# Patient Record
Sex: Female | Born: 1935 | Race: Black or African American | Hispanic: No | Smoking: Never smoker
Health system: Southern US, Community
[De-identification: ages and names within clinical notes are randomized; demographics above are authoritative.]

## PROBLEM LIST (undated history)

## (undated) DIAGNOSIS — Z950 Presence of cardiac pacemaker: Secondary | ICD-10-CM

## (undated) DIAGNOSIS — I480 Paroxysmal atrial fibrillation: Secondary | ICD-10-CM

## (undated) DIAGNOSIS — F0390 Unspecified dementia without behavioral disturbance: Secondary | ICD-10-CM

## (undated) DIAGNOSIS — R609 Edema, unspecified: Secondary | ICD-10-CM

## (undated) DIAGNOSIS — M1990 Unspecified osteoarthritis, unspecified site: Secondary | ICD-10-CM

## (undated) DIAGNOSIS — I5032 Chronic diastolic (congestive) heart failure: Secondary | ICD-10-CM

## (undated) DIAGNOSIS — I10 Essential (primary) hypertension: Secondary | ICD-10-CM

## (undated) DIAGNOSIS — K219 Gastro-esophageal reflux disease without esophagitis: Secondary | ICD-10-CM

## (undated) DIAGNOSIS — E7800 Pure hypercholesterolemia, unspecified: Secondary | ICD-10-CM

## (undated) HISTORY — PX: TONSILLECTOMY: SUR1361

## (undated) HISTORY — DX: Pure hypercholesterolemia, unspecified: E78.00

## (undated) HISTORY — PX: ABDOMINAL HYSTERECTOMY: SHX81

## (undated) HISTORY — PX: OTHER SURGICAL HISTORY: SHX169

## (undated) HISTORY — DX: Presence of cardiac pacemaker: Z95.0

## (undated) HISTORY — PX: CHOLECYSTECTOMY: SHX55

## (undated) HISTORY — DX: Edema, unspecified: R60.9

## (undated) HISTORY — DX: Paroxysmal atrial fibrillation: I48.0

## (undated) HISTORY — DX: Essential (primary) hypertension: I10

## (undated) HISTORY — PX: INSERT / REPLACE / REMOVE PACEMAKER: SUR710

---

## 2002-03-08 ENCOUNTER — Emergency Department (HOSPITAL_COMMUNITY): Admission: EM | Admit: 2002-03-08 | Discharge: 2002-03-08 | Payer: Self-pay | Admitting: *Deleted

## 2002-03-19 ENCOUNTER — Emergency Department (HOSPITAL_COMMUNITY): Admission: EM | Admit: 2002-03-19 | Discharge: 2002-03-19 | Payer: Self-pay | Admitting: Emergency Medicine

## 2002-03-19 ENCOUNTER — Encounter: Payer: Self-pay | Admitting: Emergency Medicine

## 2002-12-20 ENCOUNTER — Emergency Department (HOSPITAL_COMMUNITY): Admission: EM | Admit: 2002-12-20 | Discharge: 2002-12-20 | Payer: Self-pay | Admitting: Emergency Medicine

## 2004-04-01 DIAGNOSIS — Z950 Presence of cardiac pacemaker: Secondary | ICD-10-CM

## 2004-04-01 HISTORY — DX: Presence of cardiac pacemaker: Z95.0

## 2004-07-16 ENCOUNTER — Emergency Department (HOSPITAL_COMMUNITY): Admission: EM | Admit: 2004-07-16 | Discharge: 2004-07-16 | Payer: Self-pay | Admitting: Family Medicine

## 2005-05-28 ENCOUNTER — Emergency Department (HOSPITAL_COMMUNITY): Admission: EM | Admit: 2005-05-28 | Discharge: 2005-05-28 | Payer: Self-pay | Admitting: Internal Medicine

## 2006-06-25 ENCOUNTER — Encounter: Admission: RE | Admit: 2006-06-25 | Discharge: 2006-06-25 | Payer: Self-pay | Admitting: Emergency Medicine

## 2008-08-08 ENCOUNTER — Emergency Department (HOSPITAL_COMMUNITY): Admission: EM | Admit: 2008-08-08 | Discharge: 2008-08-08 | Payer: Self-pay | Admitting: *Deleted

## 2008-12-09 ENCOUNTER — Observation Stay (HOSPITAL_COMMUNITY): Admission: EM | Admit: 2008-12-09 | Discharge: 2008-12-10 | Payer: Self-pay | Admitting: Emergency Medicine

## 2008-12-14 ENCOUNTER — Telehealth (INDEPENDENT_AMBULATORY_CARE_PROVIDER_SITE_OTHER): Payer: Self-pay | Admitting: *Deleted

## 2008-12-15 ENCOUNTER — Ambulatory Visit: Payer: Self-pay

## 2008-12-21 ENCOUNTER — Telehealth (INDEPENDENT_AMBULATORY_CARE_PROVIDER_SITE_OTHER): Payer: Self-pay | Admitting: *Deleted

## 2008-12-21 DIAGNOSIS — R0789 Other chest pain: Secondary | ICD-10-CM | POA: Insufficient documentation

## 2009-01-17 DIAGNOSIS — I10 Essential (primary) hypertension: Secondary | ICD-10-CM | POA: Insufficient documentation

## 2009-01-17 DIAGNOSIS — M1990 Unspecified osteoarthritis, unspecified site: Secondary | ICD-10-CM | POA: Insufficient documentation

## 2009-01-19 ENCOUNTER — Ambulatory Visit: Payer: Self-pay

## 2009-01-19 ENCOUNTER — Ambulatory Visit (HOSPITAL_COMMUNITY): Admission: RE | Admit: 2009-01-19 | Discharge: 2009-01-19 | Payer: Self-pay | Admitting: Cardiology

## 2009-01-19 ENCOUNTER — Ambulatory Visit: Payer: Self-pay | Admitting: Cardiology

## 2009-01-19 ENCOUNTER — Ambulatory Visit: Payer: Self-pay | Admitting: Internal Medicine

## 2009-01-19 ENCOUNTER — Encounter: Payer: Self-pay | Admitting: Internal Medicine

## 2009-01-19 DIAGNOSIS — E785 Hyperlipidemia, unspecified: Secondary | ICD-10-CM | POA: Insufficient documentation

## 2009-01-26 ENCOUNTER — Ambulatory Visit (HOSPITAL_COMMUNITY): Admission: RE | Admit: 2009-01-26 | Discharge: 2009-01-26 | Payer: Self-pay | Admitting: Internal Medicine

## 2009-01-26 ENCOUNTER — Encounter: Payer: Self-pay | Admitting: Internal Medicine

## 2009-01-27 ENCOUNTER — Ambulatory Visit: Payer: Self-pay | Admitting: Cardiovascular Disease

## 2009-02-03 ENCOUNTER — Encounter (INDEPENDENT_AMBULATORY_CARE_PROVIDER_SITE_OTHER): Payer: Self-pay | Admitting: *Deleted

## 2009-03-08 ENCOUNTER — Ambulatory Visit: Payer: Self-pay | Admitting: Internal Medicine

## 2009-03-08 DIAGNOSIS — M949 Disorder of cartilage, unspecified: Secondary | ICD-10-CM

## 2009-03-08 DIAGNOSIS — M25519 Pain in unspecified shoulder: Secondary | ICD-10-CM | POA: Insufficient documentation

## 2009-03-08 DIAGNOSIS — R259 Unspecified abnormal involuntary movements: Secondary | ICD-10-CM | POA: Insufficient documentation

## 2009-03-08 DIAGNOSIS — K219 Gastro-esophageal reflux disease without esophagitis: Secondary | ICD-10-CM | POA: Insufficient documentation

## 2009-03-08 DIAGNOSIS — M899 Disorder of bone, unspecified: Secondary | ICD-10-CM | POA: Insufficient documentation

## 2009-03-09 ENCOUNTER — Telehealth: Payer: Self-pay | Admitting: Internal Medicine

## 2009-03-09 LAB — CONVERTED CEMR LAB
ALT: 13 units/L (ref 0–35)
AST: 16 units/L (ref 0–37)
Albumin: 3.6 g/dL (ref 3.5–5.2)
Alkaline Phosphatase: 82 units/L (ref 39–117)
Bilirubin, Direct: 0 mg/dL (ref 0.0–0.3)
LDL Cholesterol: 118 mg/dL — ABNORMAL HIGH (ref 0–99)
Total Protein: 8.6 g/dL — ABNORMAL HIGH (ref 6.0–8.3)

## 2009-03-14 ENCOUNTER — Encounter: Payer: Self-pay | Admitting: Internal Medicine

## 2009-03-27 ENCOUNTER — Telehealth: Payer: Self-pay | Admitting: Internal Medicine

## 2009-03-28 ENCOUNTER — Encounter (INDEPENDENT_AMBULATORY_CARE_PROVIDER_SITE_OTHER): Payer: Self-pay | Admitting: *Deleted

## 2009-04-07 ENCOUNTER — Telehealth: Payer: Self-pay | Admitting: Internal Medicine

## 2009-06-29 ENCOUNTER — Emergency Department (HOSPITAL_COMMUNITY): Admission: EM | Admit: 2009-06-29 | Discharge: 2009-06-29 | Payer: Self-pay | Admitting: Emergency Medicine

## 2009-06-29 ENCOUNTER — Ambulatory Visit: Payer: Self-pay | Admitting: Internal Medicine

## 2009-07-25 ENCOUNTER — Ambulatory Visit: Payer: Self-pay | Admitting: Internal Medicine

## 2009-07-25 DIAGNOSIS — M25569 Pain in unspecified knee: Secondary | ICD-10-CM | POA: Insufficient documentation

## 2009-08-03 ENCOUNTER — Emergency Department (HOSPITAL_COMMUNITY): Admission: EM | Admit: 2009-08-03 | Discharge: 2009-08-03 | Payer: Self-pay | Admitting: Emergency Medicine

## 2009-08-07 ENCOUNTER — Encounter: Payer: Self-pay | Admitting: Internal Medicine

## 2009-08-09 ENCOUNTER — Encounter: Payer: Self-pay | Admitting: Internal Medicine

## 2009-08-11 ENCOUNTER — Telehealth (INDEPENDENT_AMBULATORY_CARE_PROVIDER_SITE_OTHER): Payer: Self-pay | Admitting: *Deleted

## 2009-09-13 ENCOUNTER — Ambulatory Visit: Payer: Self-pay | Admitting: Internal Medicine

## 2009-09-13 DIAGNOSIS — F329 Major depressive disorder, single episode, unspecified: Secondary | ICD-10-CM | POA: Insufficient documentation

## 2009-09-13 DIAGNOSIS — R609 Edema, unspecified: Secondary | ICD-10-CM | POA: Insufficient documentation

## 2009-09-13 DIAGNOSIS — H918X9 Other specified hearing loss, unspecified ear: Secondary | ICD-10-CM | POA: Insufficient documentation

## 2009-09-13 DIAGNOSIS — R5381 Other malaise: Secondary | ICD-10-CM | POA: Insufficient documentation

## 2009-09-13 DIAGNOSIS — R5383 Other fatigue: Secondary | ICD-10-CM

## 2009-09-13 HISTORY — DX: Edema, unspecified: R60.9

## 2009-09-21 ENCOUNTER — Encounter (INDEPENDENT_AMBULATORY_CARE_PROVIDER_SITE_OTHER): Payer: Self-pay | Admitting: *Deleted

## 2009-09-28 ENCOUNTER — Encounter: Payer: Self-pay | Admitting: Internal Medicine

## 2010-01-23 ENCOUNTER — Ambulatory Visit: Payer: Self-pay | Admitting: Internal Medicine

## 2010-01-23 DIAGNOSIS — M79609 Pain in unspecified limb: Secondary | ICD-10-CM | POA: Insufficient documentation

## 2010-02-07 ENCOUNTER — Encounter (INDEPENDENT_AMBULATORY_CARE_PROVIDER_SITE_OTHER): Payer: Self-pay | Admitting: *Deleted

## 2010-02-20 ENCOUNTER — Encounter: Payer: Self-pay | Admitting: Internal Medicine

## 2010-03-20 ENCOUNTER — Ambulatory Visit: Payer: Self-pay | Admitting: Internal Medicine

## 2010-05-01 NOTE — Letter (Signed)
Summary: Power Wheelchair/[employer]  [employer] Wheel[employer]   Imported By: Sherian Rein 08/10/2009 11:40:35  _____________________________________________________________________  External Attachment:    Type:   Image     Comment:   External Document

## 2010-05-01 NOTE — Consult Note (Signed)
Summary: Office Visit / [facility]  Office Visit / [facility]   Imported By: Lennie Odor 04/03/2009 14:01:03  _____________________________________________________________________  External Attachment:    Type:   Image     Comment:   External Document

## 2010-05-01 NOTE — Letter (Signed)
Summary: DME/[facility]  DME/[facility]   Imported By: Lester [place] 08/15/2009 09:17:00  _____________________________________________________________________  External Attachment:    Type:   Image     Comment:   External Document

## 2010-05-01 NOTE — Progress Notes (Signed)
Summary: Mobility Eval forms--Pt needs appt  Phone Note Other Incoming   Summary of Call: Our office received paperwork from [facility] for Face-to-Face Mobility Eval. Per MD patient needs office visit. Called patient to notify, spoke with patient daughter and she made appt for 04-12-09. Initial call taken by: Lucious Groves,  April 07, 2009 9:27 AM

## 2010-05-01 NOTE — Letter (Signed)
Summary: [facility] Consult Scheduled Letter  [facility]  [address] [place], [place] [address]   Phone: [phone]  Fax: [phone]      09/21/2009 MRN: [id number]  Tara Hughes [address] [place], [place]  [address]    Dear Ms. Picklesimer,      We have scheduled an appointment for you. At the recommendation of Dr.John, we have scheduled you a consult with Dr. Clovis Cao [facility]) on June 30,2011 at 9:30AM. Their phone number is [phone].If this appointment day and time is not convenient for you, please feel free to call the office of the doctor you are being referred to at the number listed above and reschedule the appointment.  [facility] [address] [place], [place] [phone] phone   Thank you,  Patient Care Coordinator [facility]

## 2010-05-01 NOTE — Assessment & Plan Note (Signed)
Summary: 6 MOS F/U #/CD   Vital Signs:  Patient profile:   77 year old female Height:      59 inches Weight:      172 pounds BMI:     34.87 O2 Sat:      98 % on Room air Temp:     98 degrees F oral Pulse rate:   81 / minute BP sitting:   142 / 90  (left arm) Cuff size:   regular  Vitals Entered ByZella Ball Ewing (September 13, 2009 9:35 AM)  O2 Flow:  Room air  CC: 6 month followup/RE   Primary Care Provider:  None  CC:  6 month followup/RE.  History of Present Illness: fell with left wrist fx april 2011 - to get cast off soon; but now has the power wheelchair; today walkign with the cane;  here to c/o increased bilat edema to legs for about 1-2 months, seems assoc with increased knee sweling and pain, gradually worse'; Pt denies CP except for indigestion when she does not chew well, sob, doe, wheezing, orthopnea, pnd, worsening LE edema, palps, dizziness or syncope .  Pt denies new neuro symptoms such as headache, facial or extremity weakness  Trying to follow lower chol diet, good med complaicne and tolerance. without myalgias or joint pains except knees.  Here for wellness Diet: Heart Healthy or DM if diabetic Physical Activities: Sedentary Depression/mood screen: mild, but declines tx  Hearing: getting worse in the past year  Visual Acuity: wears glasses, harder to see distance ADL's:needs help with bathing, dressing, grooming, toileting due to broken left wrist but daughter lives with her to help for now  Fall Risk: High risk of fall - has power wheelchair at home Home Safety: Good Cognitive Impairment:  Gen appearance, affect, speech, memory, attention & motor skills grossly intact End-of-Life Planning: Advance directive - Full code/I agree   Preventive Screening-Counseling & Management      Drug Use:  no.    Problems Prior to Update: 1)  Peripheral Edema  (ICD-782.3) 2)  Fatigue  (ICD-780.79) 3)  Other Specified Forms of Hearing Loss  (ICD-389.8) 4)  Depression   (ICD-311) 5)  Knee Pain, Bilateral  (ICD-719.46) 6)  Knee Pain, Bilateral  (ICD-719.46) 7)  Hepatotoxicity, Drug-induced, Risk of  (ICD-V58.69) 8)  Shoulder Pain, Bilateral  (ICD-719.41) 9)  Hyperlipidemia  (ICD-272.4) 10)  Osteopenia  (ICD-733.90) 11)  Tremor  (ICD-781.0) 12)  Gerd  (ICD-530.81) 13)  Hyperlipidemia-mixed  (ICD-272.4) 14)  Osteoarthritis  (ICD-715.90) 15)  Morbid Obesity  (ICD-278.01) 16)  Hypertension  (ICD-401.9) 17)  Chest Pain Unspecified  (ICD-786.50)  Medications Prior to Update: 1)  Metoprolol Tartrate 25 Mg Tabs (Metoprolol Tartrate) .... Take One-Half  Tablet By Mouth Twice A Day 2)  Protonix 40 Mg Solr (Pantoprazole Sodium) .Marland Kitchen.. 1 Tab Once Daily 3)  Aspir-Low 81 Mg Tbec (Aspirin) .Marland Kitchen.. 1 By Mouth Once Daily 4)  Nitrostat 0.4 Mg Subl (Nitroglycerin) .Marland Kitchen.. 1 Tablet Under Tongue At Onset of Chest Pain; You May Repeat Every 5 Minutes For Up To 3 Doses. 5)  Simvastatin 40 Mg Tabs (Simvastatin) .Marland Kitchen.. 1 By Mouth Once Daily 6)  Tramadol Hcl 50 Mg Tabs (Tramadol Hcl) .Marland Kitchen.. 1 By Mouth Q 6 Hrs As Needed  Current Medications (verified): 1)  Metoprolol Tartrate 25 Mg Tabs (Metoprolol Tartrate) .... Take One-Half  Tablet By Mouth Twice A Day 2)  Omeprazole 20 Mg Cpdr (Omeprazole) .... 2 By Mouth Once Daily 3)  Aspir-Low 81 Mg Tbec (  Aspirin) .Marland Kitchen.. 1 By Mouth Once Daily 4)  Nitrostat 0.4 Mg Subl (Nitroglycerin) .Marland Kitchen.. 1 Tablet Under Tongue At Onset of Chest Pain; You May Repeat Every 5 Minutes For Up To 3 Doses. 5)  Simvastatin 40 Mg Tabs (Simvastatin) .Marland Kitchen.. 1 By Mouth Once Daily 6)  Hydrocodone-Acetaminophen 5-325 Mg Tabs (Hydrocodone-Acetaminophen) .Marland Kitchen.. 1 By Mouth Q 6 Hrs As Needed 7)  Citalopram Hydrobromide 10 Mg Tabs (Citalopram Hydrobromide) .Marland Kitchen.. 1  Po Once Daily 8)  Hydrochlorothiazide 12.5 Mg Caps (Hydrochlorothiazide) .Marland Kitchen.. 1 By Mouth Once Daily  Allergies (verified): No Known Drug Allergies  Past History:  Past Surgical History: Last updated: 03/08/2009  1.  Tonsillectomy.   2. Cholecystectomy s/p lumbar disc surgury late 1980's Hysterectomy (not the ovaries)  Family History: Last updated: 01/17/2009 Daughter with MI at age 65 status post CABG.  Mother   died of old age.  Dad died of prostate cancer.      Social History: Last updated: 09/13/2009 The patient reports that she has never smoked.  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         all otherwise negative per pt -  excep for increased depressive symtpoms and fatigue in the psat month, mild iwthout OSA symptoms  Physical Exam  General:  alert and overweight-appearing.   Head:  normocephalic and atraumatic.   Eyes:  vision grossly intact, pupils equal, and pupils round.   Ears:  R ear normal and L ear normal.  but hearing reduced Nose:  no external deformity and no nasal discharge.   Mouth:  no gingival abnormalities and pharynx pink and moist.   Neck:  supple and no masses.   Lungs:  normal respiratory effort and normal breath sounds.   Heart:  normal rate and regular rhythm.     Abdomen:  soft, non-tender, and normal bowel sounds.   Msk:  no joint tenderness and no joint swelling.  except for bilat knee efusions  Extremities:  trace edema bilat LE below the knees, no erythema or uclers Neurologic:  cranial nerves II-XII intact and strength normal in all extremities.     Impression & Recommendations:  Problem # 1:  Preventive Health Care (ICD-V70.0)  Overall doing well, age appropriate education and counseling updated and referral for appropriate preventive services done unless declined, immunizations up to date or declined, diet counseling done if overweight, urged to quit smoking if smokes , most recent labs reviewed and current ordered if appropriate, ecg reviewed or declined (interpretation per ECG scanned in the EMR if done); information regarding Medicare Prevention requirements given if appropriate; speciality referrals updated as appropriate   Orders: First annual wellness visit with prevention plan  (Z6109)  Problem # 2:  KNEE PAIN, BILATERAL (ICD-719.46)  Her updated medication list for this problem  includes:    Aspir-low 81 Mg Tbec (Aspirin) .Marland Kitchen... 1 by mouth once daily    Hydrocodone-acetaminophen 5-325 Mg Tabs (Hydrocodone-acetaminophen) .Marland Kitchen... 1 by mouth q 6 hrs as needed  Orders: Orthopedic Surgeon Referral (Ortho Surgeon) incresed pain, to orthopedic for further eval and tx   Problem # 3:  DEPRESSION (ICD-311)  Her updated medication list for this problem includes:    Citalopram Hydrobromide 10 Mg Tabs (Citalopram hydrobromide) .Marland Kitchen... 1  po once daily treat as above, f/u any worsening signs or symptoms   Orders: Prescription Created Electronically [phone])  Problem # 4:  OTHER SPECIFIED FORMS OF HEARING LOSS (ICD-389.8)  unclaer etiology - refer ENT  Orders: ENT Referral (ENT)  Problem # 5:  HYPERLIPIDEMIA (ICD-272.4)  Her updated medication list for this problem includes:    Simvastatin 40 Mg Tabs (Simvastatin) .Marland Kitchen... 1 by mouth once  daily  Orders: TLB-Lipid Panel (80061-LIPID)  Labs Reviewed: SGOT: 16 (03/08/2009)   SGPT: 13 (03/08/2009)   HDL:52.40 (03/08/2009)  LDL:118 (03/08/2009)  Chol:186 (03/08/2009)  Trig:79.0 (03/08/2009) stable overall by hx and exam, ok to continue meds/tx as is   Problem # 6:  FATIGUE (ICD-780.79) exam benign, to check labs below; follow with expectant management  Orders: TLB-BMP (Basic Metabolic Panel-BMET) (80048-METABOL) TLB-CBC Platelet - w/Differential (85025-CBCD) TLB-Hepatic/Liver Function Pnl (80076-HEPATIC) TLB-TSH (Thyroid Stimulating Hormone) (84443-TSH) TLB-Sedimentation Rate (ESR) (85652-ESR) TLB-IBC Pnl (Iron/FE;Transferrin) (83550-IBC) TLB-B12 + Folate Pnl (82746_82607-B12/FOL) T-Vitamin D (25-Hydroxy) (60454-09811)  Problem # 7:  PERIPHERAL EDEMA (ICD-782.3)  for labs above, echo noted on chart;  mild today -for HCTZ low dose;  Her updated medication list for this problem includes:    Hydrochlorothiazide 12.5 Mg Caps (Hydrochlorothiazide) .Marland Kitchen... 1 by mouth once daily  Problem # 8:  HYPERTENSION (ICD-401.9)  Her updated medication list for this problem includes:    Metoprolol Tartrate 25 Mg Tabs (Metoprolol tartrate) .Marland Kitchen... Take one-half  tablet by mouth twice a day    Hydrochlorothiazide 12.5 Mg Caps (Hydrochlorothiazide) .Marland Kitchen... 1 by mouth once daily  BP today: 142/90 Prior BP: 152/80 (07/25/2009)  Labs Reviewed: Chol: 186 (03/08/2009)   HDL: 52.40 (03/08/2009)   LDL: 118 (03/08/2009)   TG: 79.0 (03/08/2009) stable overall by hx and exam, ok to continue meds/tx as is   Complete Medication List: 1)  Metoprolol Tartrate 25 Mg Tabs (Metoprolol tartrate) .... Take one-half  tablet by mouth twice a day 2)  Omeprazole 20 Mg Cpdr (Omeprazole) .... 2 by mouth once daily 3)  Aspir-low 81 Mg Tbec (Aspirin) .Marland Kitchen.. 1 by mouth once daily 4)  Nitrostat 0.4 Mg Subl (Nitroglycerin) .Marland Kitchen.. 1 tablet under tongue at onset of chest pain; you may repeat every 5 minutes for up to 3  doses. 5)  Simvastatin 40 Mg Tabs (Simvastatin) .Marland Kitchen.. 1 by mouth once daily 6)  Hydrocodone-acetaminophen 5-325 Mg Tabs (Hydrocodone-acetaminophen) .Marland Kitchen.. 1 by mouth q 6 hrs as needed 7)  Citalopram Hydrobromide 10 Mg Tabs (Citalopram hydrobromide) .Marland Kitchen.. 1  po once daily 8)  Hydrochlorothiazide 12.5 Mg Caps (Hydrochlorothiazide) .Marland Kitchen.. 1 by mouth once daily  Patient Instructions: 1)  please call for your yearly eye exam - consider [facility] 2)  You will be contacted about the referral(s) to: ENT, and orthopedic 3)  Please go to the Lab in the basement for your blood and/or urine tests today 4)  Please take all new medications as prescribed  - the generic prilosec, metoprolol for blood pressure, and citalopram for depression, and the fluid pill for the swelling 5)  Continue all  previous medications as before this visit  6)  Please schedule a follow-up appointment in 6 months or sooner if needed Prescriptions: HYDROCHLOROTHIAZIDE 12.5 MG CAPS (HYDROCHLOROTHIAZIDE) 1 by mouth once daily  #30 x 11   Entered and Authorized by:   Corwin Levins MD   Signed by:   Corwin Levins MD on 09/13/2009   Method used:   Print then Give to Patient   RxID:   [id number] CITALOPRAM HYDROBROMIDE 10 MG TABS (CITALOPRAM HYDROBROMIDE) 1  po once daily  #30 x 11   Entered and Authorized by:   Corwin Levins MD   Signed by:   Corwin Levins MD on 09/13/2009   Method used:   Print then Give to Patient   RxID:   [id number] OMEPRAZOLE 20 MG CPDR (OMEPRAZOLE) 2 by mouth once daily  #60 x 11   Entered and Authorized by:   Corwin Levins MD   Signed by:   Corwin Levins MD on 09/13/2009   Method used:   Print then Give to Patient   RxID:   [id number] METOPROLOL TARTRATE 25 MG TABS (METOPROLOL TARTRATE) Take one-half  tablet by mouth twice a day  #30 x 11   Entered and Authorized by:   Corwin Levins MD   Signed by:   Corwin Levins MD on 09/13/2009   Method used:   Print then Give to Patient   RxID:    [id number] HYDROCODONE-ACETAMINOPHEN 5-325 MG TABS (HYDROCODONE-ACETAMINOPHEN) 1 by mouth q 6 hrs as needed  #120 x 2   Entered and Authorized by:   Corwin Levins MD   Signed by:   Corwin Levins MD on 09/13/2009   Method used:   Print then Give to Patient   RxID:   [id number]

## 2010-05-01 NOTE — Assessment & Plan Note (Signed)
Summary: discuss mobility eval/#/cd   Vital Signs:  Patient profile:   77 year old female Height:      59 inches Weight:      173.25 pounds BMI:     35.12 O2 Sat:      98 % on Room air Temp:     98.3 degrees F oral Pulse rate:   81 / minute BP sitting:   152 / 80  (left arm) Cuff size:   regular  Vitals Entered ByZella Ball Ewing (July 25, 2009 10:46 AM)  O2 Flow:  Room air CC: Mobility Evaluation/RE   Primary Care Provider:  None  CC:  Mobility Evaluation/RE.  History of Present Illness: here with family, with several months of general worsening ambulatory ability with geriatric decline and co-morbidities; asks for handicap sticker for car, but also appears to now need further help and support such as powered mobility device.   This patient is under my care and was seen today for this purpose of a face-to-face mobility exam.  Due to her co-morbidities and cont'd general decline (see problems prior to update and PMH) she is unable to perform ADL's including but not limited to bathing, grooming, cooking, cleaining, and feeding.  This patient is currently using a cane but has become unable  to perform the ADL's with this only, due to pain and fatigue associated with diagnoses.  A manual wheelchair is not appropriate due to the pain and fatigue associeated with these diagnoses.  The patient cannot use a scooter as scooter would not provide the patient enough support considiring the diagnoses and patient's home will not accomodate.  The patient will need a Power chair for lifetime use.  The patient can transefer safely to and from Power chair.  The Power chair will help patient gain independence to perform MRADL's.  The patient's environment will support the power wheelchair.  The patient is willing and able to safely use Powerchair within their home.  Today pt c/o specifically bilat shoudler and knee pain, without sweling, or recent fall , trauma or injury.  No fever, wt loss, night sweats or  other constitutional symptoms.  Pt denies CP, sob, doe, wheezing, orthopnea, pnd, worsening LE edema, palps, dizziness or syncope   Pt denies new neuro symptoms such as headache, facial or extremity weakness Did not see ortho after last visit.    Problems Prior to Update: 1)  Hepatotoxicity, Drug-induced, Risk of  (ICD-V58.69) 2)  Shoulder Pain, Bilateral  (ICD-719.41) 3)  Hyperlipidemia  (ICD-272.4) 4)  Osteopenia  (ICD-733.90) 5)  Tremor  (ICD-781.0) 6)  Gerd  (ICD-530.81) 7)  Hyperlipidemia-mixed  (ICD-272.4) 8)  Osteoarthritis  (ICD-715.90) 9)  Morbid Obesity  (ICD-278.01) 10)  Hypertension  (ICD-401.9) 11)  Chest Pain Unspecified  (ICD-786.50)  Medications Prior to Update: 1)  Metoprolol Tartrate 25 Mg Tabs (Metoprolol Tartrate) .... Take One-Half  Tablet By Mouth Twice A Day 2)  Protonix 40 Mg Solr (Pantoprazole Sodium) .Marland Kitchen.. 1 Tab Once Daily 3)  Aspir-Low 81 Mg Tbec (Aspirin) .Marland Kitchen.. 1 By Mouth Once Daily 4)  Nitrostat 0.4 Mg Subl (Nitroglycerin) .Marland Kitchen.. 1 Tablet Under Tongue At Onset of Chest Pain; You May Repeat Every 5 Minutes For Up To 3 Doses. 5)  Simvastatin 40 Mg Tabs (Simvastatin) .Marland Kitchen.. 1 By Mouth Once Daily 6)  Tramadol Hcl 50 Mg Tabs (Tramadol Hcl) .Marland Kitchen.. 1 By Mouth Q 6 Hrs As Needed  Current Medications (verified): 1)  Metoprolol Tartrate 25 Mg Tabs (Metoprolol Tartrate) .... Take One-Half  Tablet By Mouth Twice A Day 2)  Protonix 40 Mg Solr (Pantoprazole Sodium) .Marland Kitchen.. 1 Tab Once Daily 3)  Aspir-Low 81 Mg Tbec (Aspirin) .Marland Kitchen.. 1 By Mouth Once Daily 4)  Nitrostat 0.4 Mg Subl (Nitroglycerin) .Marland Kitchen.. 1 Tablet Under Tongue At Onset of Chest Pain; You May Repeat Every 5 Minutes For Up To 3 Doses. 5)  Simvastatin 40 Mg Tabs (Simvastatin) .Marland Kitchen.. 1 By Mouth Once Daily 6)  Tramadol Hcl 50 Mg Tabs (Tramadol Hcl) .Marland Kitchen.. 1 By Mouth Q 6 Hrs As Needed  Allergies (verified): No Known Drug Allergies  Past History:  Past Medical History: Last updated: 03/08/2009 Current Problems:  OSTEOARTHRITIS  (ICD-715.90) MORBID OBESITY (ICD-278.01) HYPERTENSION (ICD-401.9) CHEST PAIN UNSPECIFIED (ICD-786.50) GERD benign essential tremor Osteopenia Hyperlipidemia Knee DJD bilateral  Past Surgical History: Last updated: 03/08/2009  1. Tonsillectomy.   2. Cholecystectomy s/p lumbar disc surgury late 1980's Hysterectomy (not the ovaries)  Family History: Last updated: 01/17/2009 Daughter with MI at age 23 status post CABG.  Mother   died of old age.  Dad died of prostate cancer.      Social History: Last updated: 03/08/2009 The patient reports that she has never smoked.  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   Head:  normocephalic and atraumatic.   Eyes:  vision grossly intact, pupils equal, and pupils round.   Ears:  R ear normal and L ear normal.   Nose:  no external deformity and no nasal discharge.   Mouth:  no gingival abnormalities and pharynx pink and moist.   Neck:  supple and no masses.   Lungs:  normal respiratory effort and normal breath sounds.   Heart:  normal rate and regular rhythm.   Abdomen:  soft, non-tender, and normal bowel sounds.   Msk:  bilat shoudlers with mild bilat subachormial tenderness, and bilat knee bony changes with crepitus and mild effusions Extremities:  no edema, no erythema  Neurologic:  cranial nerves II-XII intact, strength normal in all extremities, and DTRs symmetrical and normal.  but frail with unstaeady gait and high risk fall   Impression & Recommendations:  Problem # 1:  SHOULDER PAIN, BILATERAL (ICD-719.41)  Her updated medication list for this problem includes:    Aspir-low 81 Mg Tbec (Aspirin) .Marland Kitchen... 1 by mouth once daily    Tramadol Hcl 50 Mg Tabs (Tramadol hcl) .Marland Kitchen... 1 by mouth q 6 hrs as needed treat as above, f/u any worsening signs or symptoms , refer  ortho  Problem # 2:  KNEE PAIN, BILATERAL (ICD-719.46)  Her updated medication list for this problem includes:    Aspir-low 81 Mg Tbec (Aspirin) .Marland Kitchen... 1 by mouth once daily    Tramadol Hcl 50 Mg Tabs (Tramadol hcl) .Marland Kitchen... 1 by mouth q 6 hrs as needed treat as above, f/u any worsening signs or symptoms , refer ortho  Problem # 3:  HYPERTENSION (ICD-401.9)  Her updated medication list for this problem includes:    Metoprolol Tartrate 25 Mg Tabs (Metoprolol tartrate) .Marland Kitchen... Take one-half  tablet by mouth twice a day  BP today: 152/80 Prior BP: 128/72 (03/08/2009)  Labs Reviewed: Chol: 186 (03/08/2009)   HDL: 52.40 (03/08/2009)   LDL: 118 (03/08/2009)   TG: 79.0 (03/08/2009) mild elev today, likely situational, ok to follow, continue same treatment ,  needs better pain control and power chair  Problem # 4:  OSTEOARTHRITIS (ICD-715.90)  Her updated medication list for this problem includes:    Aspir-low 81 Mg Tbec (Aspirin) .Marland Kitchen... 1 by mouth once daily    Tramadol Hcl 50 Mg Tabs (Tramadol hcl) .Marland Kitchen... 1 by mouth q 6 hrs as needed with mult other co-morbids as listed - ok for Powerchair application - forms to be filled out  Complete Medication List: 1)  Metoprolol Tartrate 25 Mg Tabs (Metoprolol tartrate) .... Take one-half  tablet by mouth twice a day 2)  Protonix 40 Mg Solr (Pantoprazole sodium) .Marland Kitchen.. 1 tab once daily 3)  Aspir-low 81 Mg Tbec (Aspirin) .Marland Kitchen.. 1 by mouth once daily 4)  Nitrostat 0.4 Mg Subl (Nitroglycerin) .Marland Kitchen.. 1 tablet under tongue at onset of chest pain; you may repeat every 5 minutes for up to 3 doses. 5)  Simvastatin 40 Mg Tabs (Simvastatin) .Marland Kitchen.. 1 by mouth once daily 6)  Tramadol Hcl 50 Mg Tabs (Tramadol hcl) .Marland Kitchen.. 1 by mouth q 6 hrs as needed  Patient Instructions: 1)  Please take all new medications as prescribed 2)  /Continue all previous medications as before this visit 3)  You will be contacted about the referral(s) to: orthopedic 4)  Please schedule a follow-up  appointment in 6 months. Prescriptions: TRAMADOL HCL 50 MG TABS (TRAMADOL HCL) 1 by mouth q 6 hrs as needed  #60 x 5   Entered and Authorized by:   Corwin Levins MD   Signed by:   Corwin Levins MD on 07/25/2009   Method used:   Print then Give to Patient   RxID:   [id number]

## 2010-05-01 NOTE — Progress Notes (Signed)
Summary: [employer]  Phone Note Outgoing Call   Summary of Call: Faxed completed paperwork to [employer] and sent a copy to be scanned. Initial call taken by: Josph Macho RMA,  Aug 11, 2009 8:23 AM

## 2010-05-01 NOTE — Consult Note (Signed)
Summary: [facility]   Imported By: Lester Alpha 03/01/2010 07:10:23  _____________________________________________________________________  External Attachment:    Type:   Image     Comment:   External Document

## 2010-05-01 NOTE — Letter (Signed)
Summary: [facility]  [facility]   Imported By: Sherian Rein 08/24/2009 12:26:45  _____________________________________________________________________  External Attachment:    Type:   Image     Comment:   External Document

## 2010-05-01 NOTE — Consult Note (Signed)
Summary: [facility]  [facility]   Imported By: Lennie Odor 10/03/2009 15:31:01  _____________________________________________________________________  External Attachment:    Type:   Image     Comment:   External Document

## 2010-05-01 NOTE — Assessment & Plan Note (Signed)
Summary: 6 mos f/u / # / cd   Vital Signs:  Patient profile:   77 year old female Height:      60 inches Weight:      170 pounds BMI:     33.32 O2 Sat:      98 % on Room air Temp:     97.8 degrees F oral Pulse rate:   90 / minute BP sitting:   110 / 80  (left arm) Cuff size:   regular  Vitals Entered By: Zella Ball Ewing CMA (AAMA) (January 23, 2010 11:22 AM)  O2 Flow:  Room air CC: 6 month followup/RE   Primary Care Provider:  None  CC:  6 month followup/RE.  History of Present Illness: here for f/u - out of all meds for 10 -12 days due to daughter being in the hosp;  here with son who found out yesterday he needs to take over today as she has had mild swelling recur to the LE's without pain, ulcer, redness or fever;  Pt denies CP, worsening sob, doe, wheezing, orthopnea, pnd, palps, dizziness or syncope .  Pt denies new neuro symptoms such as headache, facial or extremity weakness  No fever, wt loss, night sweats, loss of appetite or other constitutional symptoms  Pt denies polydipsia, polyuria.  Denies worsening depressive symptoms, suicidal ideation, or panic.  Does have signficaint worsening 2-3 mo pain to the bilat knees, right > left with recurring effusion, and fell once 2 days ago with bruise to the right knee, no other significant injury. Does also mention about 3 mo problem with "stiff" adn painful shoulders and demontrates she can no longer abduct fully, very limited in fact wihtout recent injury.  Pain ok except to abduct , then about 6/10 bilat.  No neck pain or other arm pain/weakness/numb.  Problems Prior to Update: 1)  Foot Pain, Bilateral  (ICD-729.5) 2)  Shoulder Pain, Bilateral  (ICD-719.41) 3)  Preventive Health Care  (ICD-V70.0) 4)  Peripheral Edema  (ICD-782.3) 5)  Fatigue  (ICD-780.79) 6)  Other Specified Forms of Hearing Loss  (ICD-389.8) 7)  Depression  (ICD-311) 8)  Knee Pain, Bilateral  (ICD-719.46) 9)  Knee Pain, Bilateral  (ICD-719.46) 10)   Hepatotoxicity, Drug-induced, Risk of  (ICD-V58.69) 11)  Shoulder Pain, Bilateral  (ICD-719.41) 12)  Hyperlipidemia  (ICD-272.4) 13)  Osteopenia  (ICD-733.90) 14)  Tremor  (ICD-781.0) 15)  Gerd  (ICD-530.81) 16)  Hyperlipidemia-mixed  (ICD-272.4) 17)  Osteoarthritis  (ICD-715.90) 18)  Morbid Obesity  (ICD-278.01) 19)  Hypertension  (ICD-401.9) 20)  Chest Pain Unspecified  (ICD-786.50)  Medications Prior to Update: 1)  Metoprolol Tartrate 25 Mg Tabs (Metoprolol Tartrate) .... Take One-Half  Tablet By Mouth Twice A Day 2)  Omeprazole 20 Mg Cpdr (Omeprazole) .... 2 By Mouth Once Daily 3)  Aspir-Low 81 Mg Tbec (Aspirin) .Marland Kitchen.. 1 By Mouth Once Daily 4)  Nitrostat 0.4 Mg Subl (Nitroglycerin) .Marland Kitchen.. 1 Tablet Under Tongue At Onset of Chest Pain; You May Repeat Every 5 Minutes For Up To 3 Doses. 5)  Simvastatin 40 Mg Tabs (Simvastatin) .Marland Kitchen.. 1 By Mouth Once Daily 6)  Hydrocodone-Acetaminophen 5-325 Mg Tabs (Hydrocodone-Acetaminophen) .Marland Kitchen.. 1 By Mouth Q 6 Hrs As Needed 7)  Citalopram Hydrobromide 10 Mg Tabs (Citalopram Hydrobromide) .Marland Kitchen.. 1  Po Once Daily 8)  Hydrochlorothiazide 12.5 Mg Caps (Hydrochlorothiazide) .Marland Kitchen.. 1 By Mouth Once Daily  Current Medications (verified): 1)  Metoprolol Tartrate 25 Mg Tabs (Metoprolol Tartrate) .... Take One-Half  Tablet By Mouth Twice A Day  2)  Omeprazole 20 Mg Cpdr (Omeprazole) .... 2 By Mouth Once Daily 3)  Aspir-Low 81 Mg Tbec (Aspirin) .Marland Kitchen.. 1 By Mouth Once Daily 4)  Nitrostat 0.4 Mg Subl (Nitroglycerin) .Marland Kitchen.. 1 Tablet Under Tongue At Onset of Chest Pain; You May Repeat Every 5 Minutes For Up To 3 Doses. 5)  Simvastatin 40 Mg Tabs (Simvastatin) .Marland Kitchen.. 1 By Mouth Once Daily 6)  Hydrocodone-Acetaminophen 5-325 Mg Tabs (Hydrocodone-Acetaminophen) .Marland Kitchen.. 1 By Mouth Q 6 Hrs As Needed 7)  Citalopram Hydrobromide 10 Mg Tabs (Citalopram Hydrobromide) .Marland Kitchen.. 1  Po Once Daily 8)  Hydrochlorothiazide 25 Mg Tabs (Hydrochlorothiazide) .Marland Kitchen.. 1 By Mouth Once Daily  Allergies  (verified): No Known Drug Allergies  Past History:  Past Medical History: Last updated: 09/13/2009 OSTEOARTHRITIS (ICD-715.90) MORBID OBESITY (ICD-278.01) HYPERTENSION (ICD-401.9) CHEST PAIN UNSPECIFIED (ICD-786.50) GERD benign essential tremor Osteopenia Hyperlipidemia Knee DJD bilateral Depression  MD roster:  Dr Ross/cardiology  Past Surgical History: Last updated: 03/08/2009  1. Tonsillectomy.   2. Cholecystectomy s/p lumbar disc surgury late 1980's Hysterectomy (not the ovaries)  Social History: Last updated: 09/13/2009 The patient reports that she has never smoked.  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   Head:  normocephalic and atraumatic.   Eyes:  vision grossly intact, pupils equal, and pupils round.   Ears:  R ear normal and L ear normal.  but hearing reduced Nose:  no external deformity and no nasal discharge.   Mouth:  no gingival abnormalities and pharynx pink and moist.   Neck:  supple and no masses.   Lungs:  normal respiratory effort and normal breath sounds.   Heart:  normal rate and regular rhythm.   Msk:  no acute joint tenderness and no joint swelling.  except for bilat knee effusions adn very small contusion to right patella area; bilat shoulder NT but ROM severely limited with abduction to about 20 degrees only Extremities:  no edema, no erythema  Neurologic:  strength normal in all extremities and gait favoring the RLE, walks with cane    Impression & Recommendations:  Problem # 1:  KNEE PAIN, BILATERAL (ICD-719.46)  Her updated medication list for this problem includes:    Aspir-low 81 Mg Tbec (Aspirin) .Marland Kitchen... 1 by mouth once daily    Hydrocodone-acetaminophen 5-325 Mg Tabs (Hydrocodone-acetaminophen) .Marland Kitchen... 1 by mouth q 6 hrs as needed right more than left, with new  mild contusion but with overall worsening pain and giving way, hold on films but will need re-referral to ortho  Orders: Orthopedic Surgeon Referral (Ortho Surgeon)  Problem # 2:  SHOULDER PAIN, BILATERAL (ICD-719.41)  Her updated medication list for this problem includes:    Aspir-low 81 Mg Tbec (Aspirin) .Marland Kitchen... 1 by mouth once daily    Hydrocodone-acetaminophen 5-325 Mg Tabs (Hydrocodone-acetaminophen) .Marland Kitchen... 1 by mouth q 6 hrs as needed supect even bilat frozen shoulder  - new - also for ortho referral  Orders: Orthopedic Surgeon Referral (Ortho Surgeon)  Problem # 3:  PERIPHERAL EDEMA (ICD-782.3)  Her updated medication list for this problem includes:    Hydrochlorothiazide 25 Mg Tabs (Hydrochlorothiazide) .Marland Kitchen... 1 by mouth once daily worse off her meds for almost 2 wks - to re-start at higher strength;  for labs with next visit  Problem # 4:  HYPERTENSION (ICD-401.9)  Her updated medication list for this problem includes:    Metoprolol Tartrate 25 Mg Tabs (Metoprolol tartrate) .Marland Kitchen... Take one-half  tablet by mouth twice a day    Hydrochlorothiazide 25 Mg Tabs (Hydrochlorothiazide) .Marland Kitchen... 1 by mouth once daily  BP today: 110/80 Prior BP: 142/90 (09/13/2009)  Labs Reviewed: Chol: 186 (03/08/2009)   HDL: 52.40 (03/08/2009)   LDL: 118 (03/08/2009)   TG: 79.0 (03/08/2009) stable overall by hx and exam, ok to continue meds/tx as is   Problem # 5:  FOOT PAIN, BILATERAL (ICD-729.5)  Orders: Podiatry Referral (Podiatry)  Complete Medication List: 1)  Metoprolol Tartrate 25 Mg Tabs (Metoprolol tartrate) .... Take one-half  tablet by mouth twice a day 2)  Omeprazole 20 Mg Cpdr (Omeprazole) .... 2 by mouth once daily 3)  Aspir-low 81 Mg Tbec (Aspirin) .Marland Kitchen.. 1 by mouth once daily 4)  Nitrostat 0.4 Mg Subl (Nitroglycerin) .Marland Kitchen.. 1 tablet under tongue at onset of chest pain; you may repeat every 5 minutes for up to 3 doses. 5)  Simvastatin 40 Mg Tabs (Simvastatin) .Marland Kitchen.. 1 by mouth once  daily 6)  Hydrocodone-acetaminophen 5-325 Mg Tabs (Hydrocodone-acetaminophen) .Marland Kitchen.. 1 by mouth q 6 hrs as needed 7)  Citalopram Hydrobromide 10 Mg Tabs (Citalopram hydrobromide) .Marland Kitchen.. 1  po once daily 8)  Hydrochlorothiazide 25 Mg Tabs (Hydrochlorothiazide) .Marland Kitchen.. 1 by mouth once daily  Other Orders: Flu Vaccine 40yrs + MEDICARE PATIENTS (W1191) Administration Flu vaccine - MCR (Y7829)  Patient Instructions: 1)  you had the flu shot today 2)  You will be contacted about the referral(s) to: orthopedic for the shoudler and knees;  and podiatry for the feet 3)  increase the fluid pill - hydrochlorothiazide - to 25 mg per day 4)  Continue all previous medications as before this visit  5)  Please schedule a follow-up appointment in 2 months. Prescriptions: HYDROCHLOROTHIAZIDE 25 MG TABS (HYDROCHLOROTHIAZIDE) 1 by mouth once daily  #90 x 3   Entered and Authorized by:   Corwin Levins MD   Signed by:   Corwin Levins MD on 01/23/2010   Method used:   Print then Give to Patient   RxID:   [id number] CITALOPRAM HYDROBROMIDE 10 MG TABS (CITALOPRAM HYDROBROMIDE) 1  po once daily  #90 x 3   Entered and Authorized by:   Corwin Levins MD   Signed by:   Corwin Levins MD on 01/23/2010   Method used:   Print then Give to Patient   RxID:   [id number] HYDROCODONE-ACETAMINOPHEN 5-325 MG TABS (HYDROCODONE-ACETAMINOPHEN) 1 by mouth q 6 hrs as needed  #120 x 5   Entered and Authorized by:   Corwin Levins MD   Signed by:   Corwin Levins MD on 01/23/2010   Method used:   Print then Give to Patient   RxID:   [id number] SIMVASTATIN 40 MG TABS (SIMVASTATIN) 1 by mouth once daily  #90 x 3   Entered and Authorized by:   Corwin Levins MD   Signed by:   Corwin Levins MD on 01/23/2010   Method used:   Print then Give to Patient   RxID:   [id number] OMEPRAZOLE 20 MG CPDR (OMEPRAZOLE) 2 by mouth once daily  #180 x 3   Entered and Authorized by:   Corwin Levins MD   Signed by:   Corwin Levins MD on  01/23/2010   Method used:   Print then Give to Patient   RxID:   [id number] METOPROLOL  TARTRATE 25 MG TABS (METOPROLOL TARTRATE) Take one-half  tablet by mouth twice a day  #90 x 3   Entered and Authorized by:   Corwin Levins MD   Signed by:   Corwin Levins MD on 01/23/2010   Method used:   Print then Give to Patient   RxID:   [id number]    Orders Added: 1)  Flu Vaccine 71yrs + MEDICARE PATIENTS [Q2039] 2)  Administration Flu vaccine - MCR [G0008] 3)  Podiatry Referral [Podiatry] 4)  Orthopedic Surgeon Referral [Ortho Surgeon] 5)  Est. Patient Level IV [14782]   Flu Vaccine Consent Questions     Do you have a history of severe allergic reactions to this vaccine? no    Any prior history of allergic reactions to egg and/or gelatin? no    Do you have a sensitivity to the preservative Thimersol? no    Do you have a past history of Guillan-Barre Syndrome? no    Do you currently have an acute febrile illness? no    Have you ever had a severe reaction to latex? no    Vaccine information given and explained to patient? yes    Are you currently pregnant? no    Lot Number:[id number]   Exp Date:09/29/2010   Site Given  Left Deltoid [id number]  Appended Document: 6 mos f/u / # / cd add: pt to be referred to podiatry for toenail care per sone request

## 2010-05-01 NOTE — Letter (Signed)
Summary: [facility] Consult Scheduled Letter  [facility]  [address] [place], [place] [address]   Phone: [phone]  Fax: [phone]      02/07/2010 MRN: [id number]  Tara Hughes [address] [place], [place]  [address]    Dear Ms. Rubis,      We have scheduled an appointment for you.  At the recommendation of Dr.John, we have scheduled you a consult with Dr Theressa Stamps on 02/20/10 at 3:10pm.  Their phone number is [phone].  If this appointment day and time is not convenient for you, please feel free to call the office of the doctor you are being referred to at the number listed above and reschedule the appointment.    [facility] [address] [place], [place] [address]    Thank you,  Patient Care Coordinator  [facility]

## 2010-06-25 LAB — URINALYSIS, ROUTINE W REFLEX MICROSCOPIC
Protein, ur: NEGATIVE mg/dL
Specific Gravity, Urine: 1.004 — ABNORMAL LOW (ref 1.005–1.030)

## 2010-06-25 LAB — POCT CARDIAC MARKERS
CKMB, poc: 1.2 ng/mL (ref 1.0–8.0)
Myoglobin, poc: 51.6 ng/mL (ref 12–200)
Troponin i, poc: <0.05 ng/mL (ref 0.00–0.09)
Troponin i, poc: <0.05 ng/mL (ref 0.00–0.09)

## 2010-06-25 LAB — CBC
HCT: 40.7 % (ref 36.0–46.0)
Hemoglobin: 13.6 g/dL (ref 12.0–15.0)
MCV: 92.8 fL (ref 78.0–100.0)
Platelets: 229 10*3/uL (ref 150–400)
WBC: 6.7 10*3/uL (ref 4.0–10.5)

## 2010-06-25 LAB — COMPREHENSIVE METABOLIC PANEL
AST: 15 U/L (ref 0–37)
Alkaline Phosphatase: 76 U/L (ref 39–117)
BUN: 9 mg/dL (ref 6–23)
CO2: 26 mEq/L (ref 19–32)
Calcium: 9.2 mg/dL (ref 8.4–10.5)
GFR calc non Af Amer: >60 mL/min (ref >60)
Potassium: 3.6 mEq/L (ref 3.5–5.1)
Total Protein: 8.1 g/dL (ref 6.0–8.3)

## 2010-06-25 LAB — DIFFERENTIAL
Basophils Absolute: 0 10*3/uL (ref 0.0–0.1)
Basophils Relative: 0 % (ref 0–1)
Eosinophils Absolute: 0.3 10*3/uL (ref 0.0–0.7)
Lymphs Abs: 1.9 10*3/uL (ref 0.7–4.0)
Monocytes Relative: 5 % (ref 3–12)
Neutrophils Relative %: 63 % (ref 43–77)

## 2010-06-25 LAB — URINE MICROSCOPIC-ADD ON

## 2010-07-06 LAB — BASIC METABOLIC PANEL
CO2: 27 mEq/L (ref 19–32)
Calcium: 8.8 mg/dL (ref 8.4–10.5)
Creatinine, Ser: 0.71 mg/dL (ref 0.4–1.2)
GFR calc Af Amer: >60 mL/min (ref >60)
GFR calc non Af Amer: >60 mL/min (ref >60)
Glucose, Bld: 140 mg/dL — ABNORMAL HIGH (ref 70–99)
Sodium: 139 mEq/L (ref 135–145)

## 2010-07-06 LAB — URINALYSIS, ROUTINE W REFLEX MICROSCOPIC
Bilirubin Urine: NEGATIVE
Glucose, UA: NEGATIVE mg/dL
Ketones, ur: NEGATIVE mg/dL
Protein, ur: NEGATIVE mg/dL
pH: 6.5 (ref 5.0–8.0)

## 2010-07-06 LAB — DIFFERENTIAL
Basophils Absolute: 0 10*3/uL (ref 0.0–0.1)
Basophils Relative: 0 % (ref 0–1)
Lymphs Abs: 2.6 10*3/uL (ref 0.7–4.0)
Monocytes Absolute: 1.1 10*3/uL — ABNORMAL HIGH (ref 0.1–1.0)
Neutro Abs: 4.7 10*3/uL (ref 1.7–7.7)

## 2010-07-06 LAB — CBC
Hemoglobin: 13.5 g/dL (ref 12.0–15.0)
MCV: 92.6 fL (ref 78.0–100.0)
Platelets: ADEQUATE 10*3/uL (ref 150–400)
RBC: 4.33 MIL/uL (ref 3.87–5.11)
WBC: 8.7 10*3/uL (ref 4.0–10.5)

## 2010-07-06 LAB — TROPONIN I: Troponin I: 0.01 ng/mL (ref 0.00–0.06)

## 2010-07-06 LAB — POCT CARDIAC MARKERS
CKMB, poc: 1.3 ng/mL (ref 1.0–8.0)
CKMB, poc: 1.4 ng/mL (ref 1.0–8.0)
Myoglobin, poc: 96.2 ng/mL (ref 12–200)

## 2010-07-06 LAB — CARDIAC PANEL(CRET KIN+CKTOT+MB+TROPI)
Total CK: 67 U/L (ref 7–177)
Troponin I: 0.01 ng/mL (ref 0.00–0.06)

## 2010-07-06 LAB — URINE MICROSCOPIC-ADD ON

## 2010-07-06 LAB — URINE CULTURE: Colony Count: >=100000

## 2010-07-06 LAB — PROTIME-INR
INR: 1.1 (ref 0.00–1.49)
Prothrombin Time: 13.6 seconds (ref 11.6–15.2)

## 2010-07-06 LAB — LIPID PANEL
HDL: 41 mg/dL (ref >39)
LDL Cholesterol: 115 mg/dL — ABNORMAL HIGH (ref 0–99)
Total CHOL/HDL Ratio: 4.1 RATIO
Triglycerides: 61 mg/dL (ref <150)
VLDL: 12 mg/dL (ref 0–40)

## 2010-07-06 LAB — CK TOTAL AND CKMB (NOT AT ARMC): CK, MB: 1.2 ng/mL (ref 0.3–4.0)

## 2014-06-13 ENCOUNTER — Telehealth: Payer: Self-pay | Admitting: Internal Medicine

## 2014-06-13 NOTE — Telephone Encounter (Signed)
Pt request to reestablish with Dr. Jonny RuizJohn. Please call pt back

## 2014-06-15 NOTE — Telephone Encounter (Signed)
Ok for next available?

## 2014-06-15 NOTE — Telephone Encounter (Signed)
Pt son call back to check on this request. Please advise,pt need to been seen soon

## 2014-06-16 NOTE — Telephone Encounter (Signed)
Left message for patient to call back to make appointment

## 2014-07-15 ENCOUNTER — Encounter: Payer: Self-pay | Admitting: Internal Medicine

## 2014-07-15 ENCOUNTER — Ambulatory Visit (INDEPENDENT_AMBULATORY_CARE_PROVIDER_SITE_OTHER): Payer: Medicare Other | Admitting: Internal Medicine

## 2014-07-15 VITALS — BP 136/84 | HR 88 | Temp 97.5°F | Resp 20 | Ht 60.0 in | Wt 171.8 lb

## 2014-07-15 DIAGNOSIS — M150 Primary generalized (osteo)arthritis: Secondary | ICD-10-CM | POA: Diagnosis not present

## 2014-07-15 DIAGNOSIS — I10 Essential (primary) hypertension: Secondary | ICD-10-CM | POA: Diagnosis not present

## 2014-07-15 DIAGNOSIS — R609 Edema, unspecified: Secondary | ICD-10-CM

## 2014-07-15 DIAGNOSIS — K5909 Other constipation: Secondary | ICD-10-CM

## 2014-07-15 DIAGNOSIS — M8949 Other hypertrophic osteoarthropathy, multiple sites: Secondary | ICD-10-CM

## 2014-07-15 DIAGNOSIS — K219 Gastro-esophageal reflux disease without esophagitis: Secondary | ICD-10-CM

## 2014-07-15 DIAGNOSIS — M159 Polyosteoarthritis, unspecified: Secondary | ICD-10-CM

## 2014-07-15 DIAGNOSIS — K5900 Constipation, unspecified: Secondary | ICD-10-CM | POA: Diagnosis not present

## 2014-07-15 MED ORDER — OMEPRAZOLE 20 MG PO CPDR: 20.0000 mg | DELAYED_RELEASE_CAPSULE | Freq: Every day | ORAL | Status: DC

## 2014-07-15 MED ORDER — FUROSEMIDE 20 MG PO TABS: ORAL_TABLET | ORAL | Status: DC

## 2014-07-15 MED ORDER — DILTIAZEM HCL ER 240 MG PO CP24
240.0000 mg | ORAL_CAPSULE | Freq: Every day | ORAL | Status: DC
Start: 2014-07-15 — End: 2014-09-13

## 2014-07-15 MED ORDER — POLYETHYLENE GLYCOL 3350 17 G PO PACK: 17.0000 g | PACK | Freq: Every day | ORAL | Status: DC

## 2014-07-15 NOTE — Progress Notes (Signed)
Patient ID: Tara Hughes, female   DOB: 11-14-1925, 81 y.o.   MRN: [id number]    Facility  [facility]    Place of Service:  OFFICE     No Known Allergies  Chief Complaint  Patient presents with  . Establish Care    HPI:  81 yo female seen today as a new pt. Family c/a LE swelling. She has a hx edema.   BP is stable on diltiazem CD.   chronic constipation and takes miralax powder and PPI.  She chronic pain due to OA. She c/o right shoulder pain. Takes Tylenol ES which helps. She is requesting a hospital bed as well as shower chair and other DME that would be helpful. She is interested in [facility]    Past Medical History  Diagnosis Date  . High blood pressure   . High cholesterol    Past Surgical History  Procedure Laterality Date  . Abdominal hysterectomy    . Tonsillectomy    . Back operation    . Cholecystectomy     History   Social History  . Marital Status: Widowed    Spouse Name: N/A  . Number of Children: N/A  . Years of Education: N/A   Social History Main Topics  . Smoking status: Never Smoker   . Smokeless tobacco: Never Used  . Alcohol Use: No  . Drug Use: No  . Sexual Activity: Not on file   Other Topics Concern  . None   Social History Narrative   Diet:      Do you drink/ eat things with caffeine? Coffee,soda      Marital status: Widow                              What year were you married ?      Do you live in a house, apartment,assistred living, condo, trailer, etc.)?Yes, House      Is it one or more stories? 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                              Type & how often:      Do you have a living will? No      Do you have a DNR form?  No                      If not, do you want to discuss one? No      Do you have signed POA?HPOA forms? Yes                If so, please bring to  your        appointment         Family History  Problem Relation Age of Onset  . Cancer Father   . Diabetes Brother   . Diverticulitis Sister   . Heart disease Sister      Medications: Patient's Medications  New Prescriptions   No medications on file  Previous Medications   DILTIAZEM (DILACOR XR) 240 MG 24 HR CAPSULE    Take 240 mg by mouth  daily.   OMEPRAZOLE (PRILOSEC) 20 MG CAPSULE    Take 20 mg by mouth daily.   POLYETHYLENE GLYCOL (MIRALAX / GLYCOLAX) PACKET    Take 17 g by mouth daily.  Modified Medications   No medications on file  Discontinued Medications   No medications on file     Review of Systems  Constitutional: Positive for fatigue. Negative for fever, chills, diaphoresis, activity change and appetite change.  HENT: Positive for dental problem and hearing loss. Negative for ear pain and sore throat. Facial swelling: wears dentures.        Ear pressure  Eyes: Positive for visual disturbance (due to glaucoma and cats).       Dry eyes, glaucoma, cats ou  Respiratory: Negative for cough, chest tightness and shortness of breath.   Cardiovascular: Positive for leg swelling. Negative for chest pain and palpitations.  Gastrointestinal: Positive for constipation. Negative for nausea, vomiting, abdominal pain, diarrhea and blood in stool.  Genitourinary: Negative for dysuria.       Urinary incontinence  Musculoskeletal: Positive for myalgias, arthralgias and gait problem.       Joint stiffness  Neurological: Positive for tremors, weakness and headaches. Negative for dizziness and numbness.       Loss of balance  Psychiatric/Behavioral: Positive for hallucinations and confusion. Negative for sleep disturbance. The patient is not nervous/anxious.     Filed Vitals:   07/15/14 1105  BP: 136/84  Pulse: 88  Temp: 97.5 F (36.4 C)  TempSrc: Oral  Resp: 20  Height: 5' (1.524 m)  Weight: 171 lb 12.8 oz (77.928 kg)  SpO2: 96%   Body mass index is 33.55  kg/(m^2).  Physical Exam  Constitutional: She is oriented to person, place, and time. She appears well-developed and well-nourished.  Sitting in chair. Son and daughter-in-law present. She uses a standard walker to ambulate and has a w/c at home  HENT:  Mouth/Throat: Oropharynx is clear and moist. No oropharyngeal exudate.  Eyes: Pupils are equal, round, and reactive to light. No scleral icterus.  Neck: Neck supple. No tracheal deviation present. No thyromegaly present.  Cardiovascular: Normal rate, regular rhythm and intact distal pulses.  Exam reveals no gallop and no friction rub.   Murmur (1/6 SEM) heard. L>R +2 pitting LE edema. no calf TTP. No carotid bruit b/l  Pulmonary/Chest: Effort normal and breath sounds normal. No stridor. No respiratory distress. She has no wheezes. She has no rales.  Abdominal: Soft. Bowel sounds are normal. She exhibits no distension and no mass. There is no tenderness. There is no rebound and no guarding.  Musculoskeletal: She exhibits edema and tenderness.  Antalgic unsteady gait; b/l toenail dystrophy with toe deformities  Lymphadenopathy:    She has no cervical adenopathy.  Neurological: She is alert and oriented to person, place, and time. She has normal reflexes.  Monofilament testing intact b/l  Skin: Skin is warm and dry. No rash noted.  Tip of left 1st toe callus without ulceration  Psychiatric: She has a normal mood and affect. Her behavior is normal. Judgment and thought content normal. Her speech is slurred.     Labs reviewed: No visits with results within 3 Month(s) from this visit. Latest known visit with results is:  [facility] Conversion Encounter on 09/13/2009  Component Date Value Ref Range Status  . Vit D, 25-Hydroxy 09/13/2009 15* 30-89 ng/mL Final   See lab report for associated comment(s)     Assessment/Plan   ICD-9-CM ICD-10-CM   1. Essential hypertension,  benign - stable on diltiazem 401.1 I10 diltiazem (DILACOR XR) 240 MG 24  hr capsule     CBC with Differential     CMP     Lipid Panel     TSH     Ambulatory referral to Home Health     CMP     CBC with Differential     Lipid Panel     TSH  2. Primary osteoarthritis involving multiple joints - with chronic pain 715.09 M15.0 Ambulatory referral to Home Health  3. Gastroesophageal reflux disease without esophagitis - cont PPI 530.81 K21.9 omeprazole (PRILOSEC) 20 MG capsule  4. PERIPHERAL EDEMA - worse  782.3 R60.9 furosemide (LASIX) 20 MG tablet     CMP     TSH     Ambulatory referral to Home Health  5 . Chronic constipation - cont miralax  --add diuretic to help with swelling. Recommend that she does not take lasix after 3 pm. Take med as needed  --if constipation does not improve t next OV, t/c linzess vs amitiza tx  --await HH referral to determine what types of DME she does need  --Follow up in 1 month for CPE   [facility] Hospital[facility]ica S. Ancil Linseyarter, D. O., F. A. C. O. I.  [facility] [address] [place], [place] [address] [phone] Office (Wednesdays and Fridays 8 AM - 5 PM) [phone] Cell (Monday-Friday 8 AM - 5 PM)

## 2014-07-15 NOTE — Patient Instructions (Signed)
Will call with lab results  Do not take lasix after 3 pm. Take med as needed  Follow up in 1 month for CPE

## 2014-07-18 ENCOUNTER — Other Ambulatory Visit: Payer: Medicare Other

## 2014-07-19 LAB — CBC WITH DIFFERENTIAL/PLATELET
BASOS: 0 %
Basophils Absolute: 0 10*3/uL (ref 0.0–0.2)
EOS ABS: 0.3 10*3/uL (ref 0.0–0.4)
Eos: 3 %
HEMATOCRIT: 39.1 % (ref 34.0–46.6)
HEMOGLOBIN: 13.1 g/dL (ref 11.1–15.9)
Immature Grans (Abs): 0 10*3/uL (ref 0.0–0.1)
Immature Granulocytes: 0 %
LYMPHS ABS: 2.3 10*3/uL (ref 0.7–3.1)
Lymphs: 30 %
MCH: 30.2 pg (ref 26.6–33.0)
MCHC: 33.5 g/dL (ref 31.5–35.7)
MCV: 90 fL (ref 79–97)
MONOS ABS: 0.7 10*3/uL (ref 0.1–0.9)
Monocytes: 9 %
NEUTROS ABS: 4.4 10*3/uL (ref 1.4–7.0)
Neutrophils Relative %: 58 %
Platelets: 277 10*3/uL (ref 150–379)
RBC: 4.34 x10E6/uL (ref 3.77–5.28)
RDW: 15 % (ref 12.3–15.4)
WBC: 7.8 10*3/uL (ref 3.4–10.8)

## 2014-07-19 LAB — COMPREHENSIVE METABOLIC PANEL
ALT: 15 IU/L (ref 0–32)
AST: 17 IU/L (ref 0–40)
Albumin/Globulin Ratio: 1 — ABNORMAL LOW (ref 1.1–2.5)
Albumin: 4 g/dL (ref 3.5–4.7)
Alkaline Phosphatase: 88 IU/L (ref 39–117)
BUN/Creatinine Ratio: 19 (ref 11–26)
BUN: 13 mg/dL (ref 8–27)
Bilirubin Total: 0.3 mg/dL (ref 0.0–1.2)
CALCIUM: 9.4 mg/dL (ref 8.7–10.3)
CO2: 22 mmol/L (ref 18–29)
CREATININE: 0.67 mg/dL (ref 0.57–1.00)
Chloride: 98 mmol/L (ref 97–108)
GFR calc Af Amer: 92 mL/min/{1.73_m2} (ref >59)
GFR calc non Af Amer: 80 mL/min/{1.73_m2} (ref >59)
GLOBULIN, TOTAL: 3.9 g/dL (ref 1.5–4.5)
Glucose: 56 mg/dL — ABNORMAL LOW (ref 65–99)
POTASSIUM: 4.6 mmol/L (ref 3.5–5.2)
SODIUM: 142 mmol/L (ref 134–144)
Total Protein: 7.9 g/dL (ref 6.0–8.5)

## 2014-07-19 LAB — LIPID PANEL
Chol/HDL Ratio: 3.5 ratio units (ref 0.0–4.4)
Cholesterol, Total: 197 mg/dL (ref 100–199)
HDL: 56 mg/dL (ref >39)
LDL Calculated: 126 mg/dL — ABNORMAL HIGH (ref 0–99)
Triglycerides: 75 mg/dL (ref 0–149)
VLDL Cholesterol Cal: 15 mg/dL (ref 5–40)

## 2014-07-19 LAB — TSH: TSH: 2.98 u[IU]/mL (ref 0.450–4.500)

## 2014-07-22 ENCOUNTER — Telehealth: Payer: Self-pay

## 2014-07-22 DIAGNOSIS — Z950 Presence of cardiac pacemaker: Secondary | ICD-10-CM

## 2014-07-22 DIAGNOSIS — M7989 Other specified soft tissue disorders: Secondary | ICD-10-CM

## 2014-07-22 NOTE — Telephone Encounter (Signed)
Continue lasix  Refer to cardiology for pacemaker mx  Get venous doppler b/l LE (re: swelling) to r/o DVT

## 2014-07-22 NOTE — Telephone Encounter (Signed)
1.) Patients son called indicating his mother still has swelling and weakness in left leg, patient is taking furosemide daily. Swelling is no better since last office visit (07/15/14)  2.) Patient has a pacemaker, patients son would also like to know who is going to monitor this. Patient is NOT currently established with cardiologist.   Please advise

## 2014-07-25 NOTE — Telephone Encounter (Signed)
Spoke with patients son, ok for recommended test orders to be placed.

## 2014-07-27 ENCOUNTER — Other Ambulatory Visit: Payer: Self-pay | Admitting: Internal Medicine

## 2014-07-27 ENCOUNTER — Ambulatory Visit (HOSPITAL_COMMUNITY)
Admission: RE | Admit: 2014-07-27 | Discharge: 2014-07-27 | Disposition: A | Discharge status: Home / Self Care | Payer: Medicare Other | Source: Ambulatory Visit | Attending: Vascular Surgery | Admitting: Vascular Surgery

## 2014-07-27 ENCOUNTER — Telehealth: Payer: Self-pay

## 2014-07-27 DIAGNOSIS — I10 Essential (primary) hypertension: Secondary | ICD-10-CM | POA: Diagnosis not present

## 2014-07-27 DIAGNOSIS — M159 Polyosteoarthritis, unspecified: Secondary | ICD-10-CM | POA: Diagnosis not present

## 2014-07-27 DIAGNOSIS — M7989 Other specified soft tissue disorders: Secondary | ICD-10-CM | POA: Diagnosis present

## 2014-07-27 DIAGNOSIS — R443 Hallucinations, unspecified: Secondary | ICD-10-CM | POA: Diagnosis not present

## 2014-07-27 DIAGNOSIS — R413 Other amnesia: Secondary | ICD-10-CM | POA: Diagnosis not present

## 2014-07-27 NOTE — Telephone Encounter (Signed)
Patient was negative for DVT. Patient informed of results and aware we will call with additional instructions from doctor.  Please advise

## 2014-07-28 NOTE — Telephone Encounter (Signed)
Left message on voicemail for patient to return call when available   

## 2014-07-28 NOTE — Telephone Encounter (Signed)
No further w/u indicated at this time. F/u as scheduled.

## 2014-08-04 ENCOUNTER — Telehealth: Payer: Self-pay | Admitting: *Deleted

## 2014-08-04 NOTE — Telephone Encounter (Signed)
Spoke with son regarding hospital bed and shower chair, I informed him that the order was in process and to be looking for a call [facility] about delivery of the items needed. He stated that he understood and would be home all day awaiting their call.

## 2014-08-11 NOTE — Telephone Encounter (Signed)
Pending appointment 08/19/14

## 2014-08-19 ENCOUNTER — Ambulatory Visit (INDEPENDENT_AMBULATORY_CARE_PROVIDER_SITE_OTHER): Payer: Medicare Other | Admitting: Internal Medicine

## 2014-08-19 ENCOUNTER — Encounter: Payer: Self-pay | Admitting: Internal Medicine

## 2014-08-19 VITALS — BP 130/78 | HR 80 | Temp 97.7°F | Resp 20 | Ht 60.0 in | Wt 158.8 lb

## 2014-08-19 DIAGNOSIS — M150 Primary generalized (osteo)arthritis: Secondary | ICD-10-CM | POA: Diagnosis not present

## 2014-08-19 DIAGNOSIS — Z0000 Encounter for general adult medical examination without abnormal findings: Secondary | ICD-10-CM

## 2014-08-19 DIAGNOSIS — R609 Edema, unspecified: Secondary | ICD-10-CM | POA: Diagnosis not present

## 2014-08-19 DIAGNOSIS — M159 Polyosteoarthritis, unspecified: Secondary | ICD-10-CM

## 2014-08-19 DIAGNOSIS — Z950 Presence of cardiac pacemaker: Secondary | ICD-10-CM | POA: Diagnosis not present

## 2014-08-19 DIAGNOSIS — I10 Essential (primary) hypertension: Secondary | ICD-10-CM

## 2014-08-19 DIAGNOSIS — H547 Unspecified visual loss: Secondary | ICD-10-CM | POA: Diagnosis not present

## 2014-08-19 DIAGNOSIS — K219 Gastro-esophageal reflux disease without esophagitis: Secondary | ICD-10-CM | POA: Diagnosis not present

## 2014-08-19 DIAGNOSIS — M8949 Other hypertrophic osteoarthropathy, multiple sites: Secondary | ICD-10-CM

## 2014-08-19 DIAGNOSIS — Z1211 Encounter for screening for malignant neoplasm of colon: Secondary | ICD-10-CM | POA: Diagnosis not present

## 2014-08-19 NOTE — Progress Notes (Signed)
Patient ID: Tara Hughes, female   DOB: 05/12/1925, 81 y.o.   MRN: [id number] Subjective:    Tara Hughes is a 81 y.o. female who presents for Medicare Initial preventive examination. She has chronic low back pain as well as pain in other joints and is awaiting a hospital bed. She requires positioning of her body in ways not feasible with an ordinary bed in order to alleviate her pain. She requires frequent changes in her body position and/or an immediate need for change in body position. pilows and wedges have been tried for positioning purposes and were unsuccessful. She is currently sleeping in her recliner which is causing increased LE swelling. She also needs a shower chair. She does have constipation not relieved with miralax. She has a BM every 3-4 days.  She needs to see eye specialist as it is time for routine eye exam  Preventive Screening-Counseling & Management  Tobacco History  Smoking status  . Never Smoker   Smokeless tobacco  . Never Used     Problems Prior to Visit 1. Back pain  Current Problems (verified) Patient Active Problem List   Diagnosis Date Noted  . Essential hypertension, benign 07/15/2014  . FOOT PAIN, BILATERAL 01/23/2010  . DEPRESSION 09/13/2009  . OTHER SPECIFIED FORMS OF HEARING LOSS 09/13/2009  . FATIGUE 09/13/2009  . PERIPHERAL EDEMA 09/13/2009  . KNEE PAIN, BILATERAL 07/25/2009  . GERD 03/08/2009  . SHOULDER PAIN, BILATERAL 03/08/2009  . OSTEOPENIA 03/08/2009  . TREMOR 03/08/2009  . Other and unspecified hyperlipidemia 01/19/2009  . MORBID OBESITY 01/17/2009  . HYPERTENSION 01/17/2009  . Osteoarthritis 01/17/2009  . CHEST PAIN UNSPECIFIED 12/21/2008    Medications Prior to Visit Current Outpatient Prescriptions on File Prior to Visit  Medication Sig Dispense Refill  . diltiazem (DILACOR XR) 240 MG 24 hr capsule Take 1 capsule (240 mg total) by mouth daily. 30 capsule 6  . furosemide (LASIX) 20 MG tablet Take 1 tab po daily prn  swelling 30 tablet 3  . omeprazole (PRILOSEC) 20 MG capsule Take 1 capsule (20 mg total) by mouth daily. 30 capsule 6  . polyethylene glycol (MIRALAX / GLYCOLAX) packet Take 17 g by mouth daily. 14 each 6   No current facility-administered medications on file prior to visit.    Current Medications (verified) Current Outpatient Prescriptions  Medication Sig Dispense Refill  . diltiazem (DILACOR XR) 240 MG 24 hr capsule Take 1 capsule (240 mg total) by mouth daily. 30 capsule 6  . furosemide (LASIX) 20 MG tablet Take 1 tab po daily prn swelling 30 tablet 3  . omeprazole (PRILOSEC) 20 MG capsule Take 1 capsule (20 mg total) by mouth daily. 30 capsule 6  . polyethylene glycol (MIRALAX / GLYCOLAX) packet Take 17 g by mouth daily. 14 each 6   No current facility-administered medications for this visit.     Allergies (verified) Review of patient's allergies indicates no known allergies.   PAST HISTORY  Family History Family History  Problem Relation Age of Onset  . Cancer Father   . Diabetes Brother   . Diverticulitis Sister   . Heart disease Sister     Social History History  Substance Use Topics  . Smoking status: Never Smoker   . Smokeless tobacco: Never Used  . Alcohol Use: No     Are there smokers in your home (other than you)? No   Risk Factors Current exercise habits: The patient does not participate in regular exercise at present.  Dietary issues  discussed: increase water intake.   Cardiac risk factors: advanced age (older than 3555 for men, 565 for women) and sedentary lifestyle.  Depression Screen (Note: if answer to either of the following is "Yes", a more complete depression screening is indicated)   Over the past 2 weeks, have you felt down, depressed or hopeless? yes  Over the past 2 weeks, have you felt little interest or pleasure in doing things? no  Have you lost interest or pleasure in daily life: yes  Do you often feel hopeless? no  Do you cry easily  over simple problems? no  Activities of Daily Living In your present state of health, do you have any difficulty performing the following activities?:  Driving? Yes and no longer drives Managing money?  yes Feeding yourself? no Getting from bed to chair? N/a - sleeps in recliner Climbing a flight of stair? n/a Preparing food and eating?: yes Bathing or showering? yes Getting dressed: yes Getting to the toilet? Yes Using the toilet:no Moving around from place to place: yes In the past year have you fallen or had a near fall?:yes   Are you sexually active?  no  Do you have more than one partner?  no  Hearing Difficulties: yes Do you often ask people to speak up or repeat themselves? yes Do you experience ringing or noises in your ears? Yes- popping Do you have difficulty understanding soft or whispered voices? yes   Do you feel that you have a problem with memory? yes  Do you often misplace items? yes  Do you feel safe at home?  yes  Cognitive Testing  MMSE score 21 of 30 points   Advanced Directives have been discussed with the patient? Yes - Arbie CookeyDonna Michael (sister) is HCPOA  List the Names of Other Physician/Practitioners you currently use: 1.  cardiology  Indicate any recent Medical Services you may have received from other than [facility] providers in the past year (date may be approximate).  none  Immunization History  Administered Date(s) Administered  . Influenza Whole 03/08/2009, 01/23/2010  . Pneumococcal Polysaccharide-23 03/08/2009  . Td 03/08/2009    Screening Tests Health Maintenance  Topic Date Due  . COLONOSCOPY  12/10/1977  . ZOSTAVAX  12/11/1987  . DEXA SCAN  12/10/1992  . PNA vac Low Risk Adult (2 of 2 - PCV13) 03/08/2010  . INFLUENZA VACCINE  10/31/2014  . TETANUS/TDAP  03/09/2019    All answers were reviewed with the patient and necessary referrals were made:  Kirt Boysarter, Alithea Lapage, DO   08/19/2014   History reviewed: allergies, current medications,  past family history, past medical history, past social history, past surgical history and problem list  Review of Systems A comprehensive review of systems was negative except for: Musculoskeletal: positive for arthralgias, back pain, neck pain and stiff joints    Objective:     Vision by Snellen chart: right EAV:WUJWJXeye:unable to measure, left BJY:NWGNFAeye:unable to measure  Body mass index is 31.01 kg/(m^2). BP 130/78 mmHg  Pulse 80  Temp(Src) 97.7 F (36.5 C) (Oral)  Resp 20  Ht 5' (1.524 m)  Wt 158 lb 12.8 oz (72.031 kg)  BMI 31.01 kg/m2  SpO2 91%  Physical Exam  Constitutional: She is oriented to person, place, and time and well-developed, well-nourished, and in no distress.  HENT:  Head: Normocephalic and atraumatic.  Right Ear: External ear normal.  Left Ear: External ear normal.  Mouth/Throat: Oropharynx is clear and moist. No oropharyngeal exudate.  Eyes: Conjunctivae and EOM are  normal. Pupils are equal, round, and reactive to light. No scleral icterus.  Neck: Trachea normal. Neck supple. Spinous process tenderness and muscular tenderness present. Carotid bruit is not present. Decreased range of motion present. No tracheal deviation present. No thyroid mass and no thyromegaly present.  Flexion contracturee  Cardiovascular: Normal rate, regular rhythm, normal heart sounds and intact distal pulses.  Exam reveals no gallop and no friction rub.   No murmur heard. L>R +1 pitting LE edema. No calf TTP  Pulmonary/Chest: Effort normal and breath sounds normal. She has no wheezes. She has no rhonchi. She has no rales. She exhibits no tenderness. Right breast exhibits no inverted nipple, no mass, no nipple discharge, no skin change and no tenderness. Left breast exhibits no inverted nipple, no mass, no nipple discharge, no skin change and no tenderness. Breasts are symmetrical.  Left ACW pacemaker present  Abdominal: Soft. Bowel sounds are normal. She exhibits no distension and no mass. There is  no hepatosplenomegaly. There is no tenderness. There is no rebound and no guarding.  Musculoskeletal: She exhibits edema and tenderness.  Multiple small and large joint deformities, swelling. B/l 4th toe deformity. Toenail dystrophy noted. Slow antalgic gait. Uses walker to ambulate  Lymphadenopathy:    She has no cervical adenopathy.  Neurological: She is alert and oriented to person, place, and time. She has normal reflexes. Gait normal.  Skin: Skin is warm and dry. No rash noted.  Psychiatric: Mood, memory, affect and judgment normal.        Assessment:       ICD-9-CM ICD-10-CM   1. Initial Medicare annual wellness visit V70.0 Z00.00   2. Special screening for malignant neoplasms, colon V76.51 Z12.11 Fecal occult blood, imunochemical  3. Primary osteoarthritis involving multiple joints - stable 715.09 M15.0   4. Gastroesophageal reflux disease without esophagitis - stable; cont PPI 530.81 K21.9   5. PERIPHERAL EDEMA - stable; cont lasix 782.3 R60.9   6. Pacemaker due to cardiac arrythmia V45.01 Z95.0   7.      Essential hypertension - stable; cont cardizem      Plan:     During the course of the visit the patient was educated and counseled about appropriate screening and preventive services including:    Influenza vaccine  Screening electrocardiogram  Bone densitometry screening  Colorectal cancer screening  Diet review for nutrition referral? Yes ____  Not Indicated _x___   Patient Instructions (the written plan) was given to the patient.  --Pt is UTD on health maintenance. Vaccinations are UTD. Pt maintains a healthy lifestyle. Encouraged pt to exercise 30-45 minutes 4-5 times per week. Eat a well balanced diet. Avoid smoking. Limit alcohol intake. Wear seatbelt when riding in the car. Wear sun block (SPF >50) when spending extended times outside.  --F/u with cardiology as scheduled for pacemaker mx  --hospital bed for OA, edema  Medicare Attestation I have  personally reviewed: The patient's medical and social history Their use of alcohol, tobacco or illicit drugs Their current medications and supplements The patient's functional ability including ADLs,fall risks, home safety risks, cognitive, and hearing and visual impairment Diet and physical activities Evidence for depression or mood disorders  The patient's weight, height,BMI, and visual acuity have been recorded in the chart.  I have made referrals, counseling, and provided education to the patient based on review of the above and I have provided the patient with a written personalized care plan for preventive services.     [name], DO  08/19/2014       Damany Eastman S. Ancil Linseyarter, D. O., F. A. C. O. I.  [facility] [address] [place], [place] [address] [phone] Cell (Monday-Friday 8 AM - 5 PM) [phone] After 5 PM and follow prompts

## 2014-08-19 NOTE — Progress Notes (Signed)
Failed clock test 

## 2014-08-19 NOTE — Patient Instructions (Signed)
Encouraged her to exercise 30-45 minutes 4-5 times per week as tolerated. Eat a well balanced diet. Avoid smoking. Limit alcohol intake. Wear seatbelt when riding in the car. Wear sun block (SPF >50) when spending extended times outside.  Continue current medications as ordered  Follow up in 3 mos for routine visit  Keep appt with cardiology as scheduled

## 2014-08-22 ENCOUNTER — Other Ambulatory Visit: Payer: Medicare Other

## 2014-08-22 DIAGNOSIS — Z1211 Encounter for screening for malignant neoplasm of colon: Secondary | ICD-10-CM

## 2014-08-24 LAB — FECAL OCCULT BLOOD, IMMUNOCHEMICAL: Fecal Occult Bld: NEGATIVE

## 2014-09-01 ENCOUNTER — Ambulatory Visit (INDEPENDENT_AMBULATORY_CARE_PROVIDER_SITE_OTHER): Payer: Medicare Other | Admitting: Internal Medicine

## 2014-09-01 ENCOUNTER — Encounter: Payer: Self-pay | Admitting: *Deleted

## 2014-09-01 ENCOUNTER — Encounter: Payer: Self-pay | Admitting: Internal Medicine

## 2014-09-01 VITALS — BP 110/62 | HR 77 | Ht 60.0 in | Wt 157.0 lb

## 2014-09-01 DIAGNOSIS — I10 Essential (primary) hypertension: Secondary | ICD-10-CM | POA: Diagnosis not present

## 2014-09-01 DIAGNOSIS — R609 Edema, unspecified: Secondary | ICD-10-CM

## 2014-09-01 DIAGNOSIS — Z950 Presence of cardiac pacemaker: Secondary | ICD-10-CM

## 2014-09-01 LAB — BASIC METABOLIC PANEL
BUN: 13 mg/dL (ref 6–23)
CO2: 25 mEq/L (ref 19–32)
CREATININE: 0.71 mg/dL (ref 0.40–1.20)
Calcium: 9.5 mg/dL (ref 8.4–10.5)
Chloride: 104 mEq/L (ref 96–112)
GFR: 100.21 mL/min (ref >60.00)
Glucose, Bld: 82 mg/dL (ref 70–99)
POTASSIUM: 3.8 meq/L (ref 3.5–5.1)
Sodium: 137 mEq/L (ref 135–145)

## 2014-09-01 LAB — BRAIN NATRIURETIC PEPTIDE: Pro B Natriuretic peptide (BNP): 30 pg/mL (ref 0.0–100.0)

## 2014-09-01 NOTE — Progress Notes (Signed)
Cardiology Office Note   Date:  09/01/2014   ID:  Tara Ringsnnie M Dye, DOB Jun 22, 1925, MRN [id number]  PCP:  Kirt Boysarter, Monica, DO  Cardiologist:   Dietrich PatesPaula Ross, MD   No chief complaint on file.  F/U of HTN and PPM     History of Present Illness: Tara Hughes is a 81 y.o. female with a history of CP in past and HTN  I saw her in 2010  She had been admitted back then with CP  R/O for MI  Eech done.  Probblems with IV  Myoview not done    Cardiac CT angio in 2010 showd mild soft plaque in LAD    [place] moved to [place]  Lived there about 6 years ago  WHile there had a syncopal spell  Seen at [facility].  Had PPM placed Demaris Callander(Allan Greenspan, md:  [phone]  B[employer]ronik device.)  No syncpe since  She denies syncpe since  No SOB  HasCP off and on  Has been going for awhile  ON and off for a while  Not associated with activity.      Current Outpatient Prescriptions  Medication Sig Dispense Refill  . diltiazem (DILACOR XR) 240 MG 24 hr capsule Take 1 capsule (240 mg total) by mouth daily. 30 capsule 6  . furosemide (LASIX) 20 MG tablet Take 1 tab po daily prn swelling 30 tablet 3  . omeprazole (PRILOSEC) 20 MG capsule Take 1 capsule (20 mg total) by mouth daily. 30 capsule 6  . polyethylene glycol (MIRALAX / GLYCOLAX) packet Take 17 g by mouth daily. 14 each 6   No current facility-administered medications for this visit.    Allergies:   Review of patient's allergies indicates no known allergies.   Past Medical History  Diagnosis Date  . High blood pressure   . High cholesterol     Past Surgical History  Procedure Laterality Date  . Abdominal hysterectomy    . Tonsillectomy    . Back operation    . Cholecystectomy       Social History:  The patient  reports that she has never smoked. She has never used smokeless tobacco. She reports that she does not drink alcohol or use illicit drugs.   Family History:  The patient's family history includes Cancer in her  father; Diabetes in her brother; Diverticulitis in her sister; Heart disease in her sister.    ROS:  Please see the history of present illness. All other systems are reviewed and  Negative to the above problem except as noted.    PHYSICAL EXAM: VS:  BP 110/62 mmHg  Pulse 77  Ht 5' (1.524 m)  Wt 157 lb (71.215 kg)  BMI 30.66 kg/m2  GEN: Well nourished, well developed, in no acute distress HEENT: normal Neck: no JVD, carotid bruits, or masses Cardiac: RRR; no murmurs, rubs, or gallops,no\\1+ edema  Respiratory:  clear to auscultation bilaterally, normal work of breathing GI: soft, nontender, nondistended, + BS  No hepatomegaly  MS: no deformity Moving all extremities   Skin: warm and dry, no rash Neuro:  Strength and sensation are intact Psych: euthymic mood, full affect   EKG:  EKG is ordered today.  Atrial paced with occasional PAC  77 bpm     Lipid Panel    Component Value Date/Time   CHOL 197 07/18/2014 0946   CHOL 186 03/08/2009 1034   TRIG 75 07/18/2014 0946   HDL 56 07/18/2014 0946   HDL 52.40 03/08/2009  1034   CHOLHDL 3.5 07/18/2014 0946   CHOLHDL 4 03/08/2009 1034   VLDL 15.8 03/08/2009 1034   LDLCALC 126* 07/18/2014 0946   LDLCALC 118* 03/08/2009 1034      Wt Readings from Last 3 Encounters:  09/01/14 157 lb (71.215 kg)  08/19/14 158 lb 12.8 oz (72.031 kg)  07/15/14 171 lb 12.8 oz (77.928 kg)      ASSESSMENT AND PLAN: 1.  CP  Atypical  Follow    2.  Edema  Will set up for echo  Check BMET and BNP  Get recoreds  3.  Syncope  No recurrence since PPM  WIll get records  Needs to be set up for appt with EP and in pacemeaker clinic     Current medicines are reviewed at length with the patient today.  The patient does not have concerns regarding medicines.  The following changes have been made:   Labs/ tests ordered today include:  Check BMET and BNP   No orders of the defined types were placed in this encounter.     Disposition:   FU with  Me will  be based on test results    Signed, Dietrich Pates, MD  09/01/2014 2:46 PM    [facility] [address] [place], [place], [place]  [address] Phone: [phone]; Fax: [phone]

## 2014-09-01 NOTE — Patient Instructions (Addendum)
Medication Instructions:  No changes today  Labwork: BMET/BNP today  Testing/Procedures: Your physician has requested that you have an echocardiogram. Echocardiography is a painless test that uses sound waves to create images of your heart. It provides your doctor with information about the size and shape of your heart and how well your heart's chambers and valves are working. This procedure takes approximately one hour. There are no restrictions for this procedure.    Follow-Up: Schedule a new patient appt in the [employer] pacemaker clinic.  Follow up with Dr Tenny Crawoss will be done after testing has been completed.

## 2014-09-08 ENCOUNTER — Encounter: Payer: Medicare Other | Admitting: Internal Medicine

## 2014-09-12 ENCOUNTER — Ambulatory Visit (HOSPITAL_COMMUNITY): Payer: Medicare Other

## 2014-09-13 ENCOUNTER — Ambulatory Visit (HOSPITAL_COMMUNITY): Payer: Medicare Other | Attending: Cardiology

## 2014-09-13 ENCOUNTER — Encounter: Payer: Self-pay | Admitting: Internal Medicine

## 2014-09-13 ENCOUNTER — Ambulatory Visit (INDEPENDENT_AMBULATORY_CARE_PROVIDER_SITE_OTHER): Payer: Medicare Other | Admitting: Internal Medicine

## 2014-09-13 ENCOUNTER — Other Ambulatory Visit: Payer: Self-pay

## 2014-09-13 VITALS — BP 140/78 | HR 80 | Ht 60.0 in | Wt 171.4 lb

## 2014-09-13 DIAGNOSIS — I371 Nonrheumatic pulmonary valve insufficiency: Secondary | ICD-10-CM | POA: Insufficient documentation

## 2014-09-13 DIAGNOSIS — I10 Essential (primary) hypertension: Secondary | ICD-10-CM | POA: Diagnosis not present

## 2014-09-13 DIAGNOSIS — R609 Edema, unspecified: Secondary | ICD-10-CM

## 2014-09-13 DIAGNOSIS — I480 Paroxysmal atrial fibrillation: Secondary | ICD-10-CM

## 2014-09-13 DIAGNOSIS — R55 Syncope and collapse: Secondary | ICD-10-CM | POA: Insufficient documentation

## 2014-09-13 DIAGNOSIS — I340 Nonrheumatic mitral (valve) insufficiency: Secondary | ICD-10-CM | POA: Insufficient documentation

## 2014-09-13 DIAGNOSIS — R001 Bradycardia, unspecified: Secondary | ICD-10-CM

## 2014-09-13 DIAGNOSIS — I82402 Acute embolism and thrombosis of unspecified deep veins of left lower extremity: Secondary | ICD-10-CM | POA: Diagnosis not present

## 2014-09-13 DIAGNOSIS — Z950 Presence of cardiac pacemaker: Secondary | ICD-10-CM

## 2014-09-13 DIAGNOSIS — I071 Rheumatic tricuspid insufficiency: Secondary | ICD-10-CM | POA: Insufficient documentation

## 2014-09-13 HISTORY — DX: Paroxysmal atrial fibrillation: I48.0

## 2014-09-13 LAB — CUP PACEART INCLINIC DEVICE CHECK
Battery Remaining Percentage: 80 %
Brady Statistic RV Percent Paced: 1 %
Lead Channel Impedance Value: 448 Ohm
Lead Channel Impedance Value: 604 Ohm
Lead Channel Pacing Threshold Amplitude: 0.6 V
Lead Channel Pacing Threshold Pulse Width: 0.4 ms
Lead Channel Pacing Threshold Pulse Width: 0.4 ms
Lead Channel Sensing Intrinsic Amplitude: 2.6 mV
Lead Channel Setting Pacing Amplitude: 1.5 V
Lead Channel Setting Sensing Sensitivity: 2.5 mV
MDC IDC MSMT LEADCHNL RA PACING THRESHOLD AMPLITUDE: 0.5 V
MDC IDC MSMT LEADCHNL RV SENSING INTR AMPL: 15 mV
MDC IDC SESS DTM: 20160614161515
MDC IDC SET LEADCHNL RV PACING AMPLITUDE: 2.5 V
MDC IDC SET LEADCHNL RV PACING PULSEWIDTH: 0.4 ms
MDC IDC STAT BRADY RA PERCENT PACED: 26 %
Pulse Gen Serial Number: 66383162

## 2014-09-13 MED ORDER — LISINOPRIL 5 MG PO TABS: 5.0000 mg | ORAL_TABLET | Freq: Every day | ORAL | Status: DC

## 2014-09-13 NOTE — Patient Instructions (Addendum)
Medication Instructions:  Your physician has recommended you make the following change in your medication:  1) STOP Diltiazem  2) START Lisinopril 5 mg tablet by mouth daily   Labwork: Your physician recommends that you return for lab work in: 2 weeks (BMET)  Testing/Procedures: Your physician has requested that you have a lower or upper extremity venous duplex. This test is an ultrasound of the veins in the legs or arms. It looks at venous blood flow that carries blood from the heart to the legs or arms. Allow one hour for a Lower Venous exam. Allow thirty minutes for an Upper Venous exam. There are no restrictions or special instructions. SCHEDULE BEFORE APPT WITH DR. ROSS ON 6/30    Follow-Up: Remote monitoring is used to monitor your Pacemaker or ICD from home. This monitoring reduces the number of office visits required to check your device to one time per year. It allows [place] to keep an eye on the functioning of your device to ensure it is working properly. You are scheduled for a device check from home on 12/13/2014. You may send your transmission at any time that day. If you have a wireless device, the transmission will be sent automatically. After your physician reviews your transmission, you will receive a postcard with your next transmission date.  Your physician wants you to follow-up in: 12 months with Dr. Graciela Husbands. You will receive a reminder letter in the mail two months in advance. If you don't receive a letter, please call our office to schedule the follow-up appointment.   Any Other Special Instructions Will Be Listed Below (If Applicable).

## 2014-09-13 NOTE — Progress Notes (Signed)
ELECTROPHYSIOLOGY CONSULT NOTE  Patient ID: Tara Hughes, MRN: [id number], DOB/AGE: [birth year] 81 y.o. Admit date: (Not on file) Date of Consult: 09/13/2014  Primary Physician: Tara Boys, DO Primary Cardiologist: *PR  Chief Complaint: establish pacemaker followup   HPI Tara Hughes is a 81 y.o. female  Patient of Dr. Margretta Hughes has a history of syncope for which she underwent pacemaker implantation 2006-[employer]ronik.  This was apparently implanted for syncope; she has had no recurrent syncope.  She has modest exercise intolerance. She is markedly kyphotic. She gets around very slowly with her walker. She does not fall.  She has no history of atrial fibrillation and no prior history of TIA.  Has history of hypertension and chest pain with a CTA June 2010 showing a mild LAD plaque  An echo was scheduled for today.    Past Medical History  Diagnosis Date  . High blood pressure   . High cholesterol       Surgical History: kidney function was normal cou put her on 410ple weeks ago so we can probably stop until: Past Surgical History  Procedure Laterality Date  . Abdominal hysterectomy    . Tonsillectomy    . Back operation    . Cholecystectomy       Home Meds: Prior to Admission medications   Medication Sig Start Date End Date Taking? Authorizing Provider  diltiazem (DILACOR XR) 240 MG 24 hr capsule Take 1 capsule (240 mg total) by mouth daily. 07/15/14  Yes Tara Boys, DO  furosemide (LASIX) 20 MG tablet Take 20 mg by mouth daily.  09/01/14  Yes Tara Riffle, MD  omeprazole (PRILOSEC) 20 MG capsule Take 1 capsule (20 mg total) by mouth daily. 07/15/14  Yes Tara Boys, DO  polyethylene glycol (MIRALAX / GLYCOLAX) packet Take 17 g by mouth daily. 07/15/14  Yes Tara Boys, DO      Allergies: No Known Allergies  History   Social History  . Marital Status: Widowed    Spouse Name: N/A  . Number of Children: N/A  . Years of Education: N/A   Occupational  History  . Not on file.   Social History Main Topics  . Smoking status: Never Smoker   . Smokeless tobacco: Never Used  . Alcohol Use: No  . Drug Use: No  . Sexual Activity: Not on file   Other Topics Concern  . Not on file   Social History Narrative   Diet:      Do you drink/ eat things with caffeine? Coffee,soda      Marital status: Widow                              What year were you married ?      Do you live in a house, apartment,assistred living, condo, trailer, etc.)?Yes, House      Is it one or more stories? 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                              Type & how often:      Do you have a living  will? No      Do you have a DNR form?  No                      If not, do you want to discuss one? No      Do you have signed POA?HPOA forms? Yes                If so, please bring to your        appointment           Family History  Problem Relation Age of Onset  . Cancer Father   . Diabetes Brother   . Diverticulitis Sister   . Heart disease Sister      ROS:  Please see the history of present illness.   Negative except tremor   All other systems reviewed and negative.    Physical Exam:   Blood pressure 140/78, pulse 80, height 5' (1.524 m), weight 171 lb 6.4 oz (77.747 kg). General: Well developed, well nourished female in no acute distress. Head: Normocephalic, atraumatic, sclera non-icteric, no xanthomas, nares are without discharge. EENT: normal Lymph Nodes:  none Back: with yphosis , no CVA tendersness Neck: Negative for carotid bruits. JVD not elevated. Lungs: Clear bilaterally to auscultation without wheezes, rales, or rhonchi. Breathing is unlabored. Heart: RRR with S1 S2.  2/6 systolic  murmur , rubs, or gallops appreciated. Abdomen: Soft, non-tender, non-distended with normoactive bowel  sounds. No hepatomegaly. No rebound/guarding. No obvious abdominal masses. Msk:  Strength and tone appear normal for age. Extremities: No clubbing or cyanosis. L>R  2+  edema.  Distal pedal pulses are 2+ and equal bilaterally. Skin: Warm and Dry Neuro: Alert and oriented X 3. CN III-XII intact Grossly normal sensory and motor function . Psych:  Responds to questions appropriately with a normal affect.      Labs: Cardiac Enzymes No results for input(s): CKTOTAL, CKMB, TROPONINI in the last 72 hours. CBC Lab Results  Component Value Date   WBC 7.8 07/18/2014   HGB 13.1 07/18/2014   HCT 39.1 07/18/2014   MCV 90 07/18/2014   PLT 277 07/18/2014   PROTIME: No results for input(s): LABPROT, INR in the last 72 hours. Chemistry No results for input(s): NA, K, CL, CO2, BUN, CREATININE, CALCIUM, PROT, BILITOT, ALKPHOS, ALT, AST, GLUCOSE in the last 168 hours.  Invalid input(s): LABALBU Lipids Lab Results  Component Value Date   CHOL 197 07/18/2014   HDL 56 07/18/2014   LDLCALC 126* 07/18/2014   TRIG 75 07/18/2014   BNP PRO B NATRIURETIC PEPTIDE (BNP)  Date/Time Value Ref Range Status  09/01/2014 03:54 PM 30.0 0.0 - 100.0 pg/mL Final   Thyroid Function Tests: No results for input(s): TSH, T4TOTAL, T3FREE, THYROIDAB in the last 72 hours.  Invalid input(s): FREET3    Miscellaneous No results found for: DDIMER  Radiology/Studies:  No results found.  EKG:  ECG demonstrated atrial pacing at 80 Intervals 16/07/39   Assessment and Plan:   Syncope  Sinus node dysfunction  Hypertension  Asymmetric lower extremity edema  Pacemaker-Biotronik  Atrial fibrillation-paroxysmal  The patient has paroxysmal atrial fibrillation identified on her device. She is not on anticoagulation. She has, while difficulty getting around, no history of falls. I would recommend that we consider use a NOAC. I will discuss this with Dr. Margretta Hughes.  She has a significant amount of edema and symmetric.  We will undertake. Venous dopplers.  the diltiazem may be contributing. We  will discontinue CCB and  We'll add lisinopril for blood pressure management. She is already taking furosemide rather then adjust that at this juncture, I would like to exclude the possibility of a calcium blocker contributing.  She will need BMET in about 2 weeks      Sherryl Manges

## 2014-09-14 ENCOUNTER — Telehealth: Payer: Self-pay | Admitting: Internal Medicine

## 2014-09-14 NOTE — Telephone Encounter (Signed)
Spoke w/ pt son and informed me that we will contact [employer] Rep and see what we need to do to get home monitor working. Pt son verbalized understanding.

## 2014-09-14 NOTE — Telephone Encounter (Signed)
New Prob    Pts son has some questions regarding remote transmission device. States the box is not working. Please call.

## 2014-09-15 NOTE — Telephone Encounter (Signed)
Informed pt son that transmission was received this morning.

## 2014-09-19 NOTE — Progress Notes (Signed)
Please set up for LE venous dopplers to evaluate LE edema

## 2014-09-26 ENCOUNTER — Ambulatory Visit (HOSPITAL_COMMUNITY): Payer: Medicare Other | Attending: Cardiovascular Disease

## 2014-09-26 ENCOUNTER — Other Ambulatory Visit (INDEPENDENT_AMBULATORY_CARE_PROVIDER_SITE_OTHER): Payer: Medicare Other | Admitting: *Deleted

## 2014-09-26 DIAGNOSIS — R609 Edema, unspecified: Secondary | ICD-10-CM | POA: Diagnosis present

## 2014-09-26 DIAGNOSIS — I82402 Acute embolism and thrombosis of unspecified deep veins of left lower extremity: Secondary | ICD-10-CM

## 2014-09-26 DIAGNOSIS — Z950 Presence of cardiac pacemaker: Secondary | ICD-10-CM

## 2014-09-26 DIAGNOSIS — R001 Bradycardia, unspecified: Secondary | ICD-10-CM | POA: Diagnosis not present

## 2014-09-26 DIAGNOSIS — I480 Paroxysmal atrial fibrillation: Secondary | ICD-10-CM

## 2014-09-26 LAB — BASIC METABOLIC PANEL
BUN: 13 mg/dL (ref 6–23)
CALCIUM: 9.6 mg/dL (ref 8.4–10.5)
CO2: 29 mEq/L (ref 19–32)
CREATININE: 0.7 mg/dL (ref 0.40–1.20)
Chloride: 104 mEq/L (ref 96–112)
GFR: 101.85 mL/min (ref >60.00)
Glucose, Bld: 87 mg/dL (ref 70–99)
Potassium: 3.6 mEq/L (ref 3.5–5.1)
Sodium: 138 mEq/L (ref 135–145)

## 2014-09-26 NOTE — Addendum Note (Signed)
Addended by: Tonita PhoenixBOWDEN, ROBIN K on: 09/26/2014 09:04 AM   Modules accepted: Orders

## 2014-09-26 NOTE — Addendum Note (Signed)
Addended by: Jocelyne Reinertsen K on: 09/26/2014 09:04 AM   Modules accepted: Orders  

## 2014-09-29 ENCOUNTER — Encounter: Payer: Self-pay | Admitting: Internal Medicine

## 2014-09-29 ENCOUNTER — Ambulatory Visit (INDEPENDENT_AMBULATORY_CARE_PROVIDER_SITE_OTHER): Payer: Medicare Other | Admitting: Internal Medicine

## 2014-09-29 ENCOUNTER — Telehealth: Payer: Self-pay

## 2014-09-29 VITALS — BP 124/76 | HR 80 | Ht 60.0 in | Wt 171.8 lb

## 2014-09-29 DIAGNOSIS — Z950 Presence of cardiac pacemaker: Secondary | ICD-10-CM

## 2014-09-29 DIAGNOSIS — R001 Bradycardia, unspecified: Secondary | ICD-10-CM | POA: Diagnosis not present

## 2014-09-29 DIAGNOSIS — I82402 Acute embolism and thrombosis of unspecified deep veins of left lower extremity: Secondary | ICD-10-CM

## 2014-09-29 DIAGNOSIS — K219 Gastro-esophageal reflux disease without esophagitis: Secondary | ICD-10-CM

## 2014-09-29 DIAGNOSIS — I480 Paroxysmal atrial fibrillation: Secondary | ICD-10-CM

## 2014-09-29 DIAGNOSIS — R609 Edema, unspecified: Secondary | ICD-10-CM

## 2014-09-29 MED ORDER — FUROSEMIDE 20 MG PO TABS: ORAL_TABLET | ORAL | Status: DC

## 2014-09-29 MED ORDER — LISINOPRIL 5 MG PO TABS: 5.0000 mg | ORAL_TABLET | Freq: Every day | ORAL | Status: DC

## 2014-09-29 MED ORDER — OMEPRAZOLE 20 MG PO CPDR: 20.0000 mg | DELAYED_RELEASE_CAPSULE | Freq: Every day | ORAL | Status: DC

## 2014-09-29 MED ORDER — APIXABAN 5 MG PO TABS: 5.0000 mg | ORAL_TABLET | Freq: Two times a day (BID) | ORAL | Status: DC

## 2014-09-29 MED ORDER — POTASSIUM CHLORIDE 20 MEQ PO PACK: 20.0000 meq | PACK | ORAL | Status: DC

## 2014-09-29 NOTE — Progress Notes (Signed)
Cardiology Office Note   Date:  09/29/2014   ID:  Tara Hughes, DOB [birth year], MRN [id number]  PCP:  [name], DO  Cardiologist:   Dietrich Pates, MD   No chief complaint on file.     History of Present Illness: Tara Hughes is a 81 y.o. female with a history of CP, HTN, mild CAD  She is s/p PPM   She saw Odessa Fleming on 6/20  On pacer evaluation she was found to be in and out of afib  Referred back  He alsos cut back on ca blocker and ordered LE venous dopplers  Dopplers were negative  She says edema some bettter off of med  Still therre and worse on leg with pain  She deneis CP  No falling  Breathing is OK        Current Outpatient Prescriptions  Medication Sig Dispense Refill  . furosemide (LASIX) 20 MG tablet Take 20 mg by mouth daily.     Marland Kitchen lisinopril (PRINIVIL,ZESTRIL) 5 MG tablet Take 1 tablet (5 mg total) by mouth daily. 90 tablet 3  . omeprazole (PRILOSEC) 20 MG capsule Take 1 capsule (20 mg total) by mouth daily. 30 capsule 6  . polyethylene glycol (MIRALAX / GLYCOLAX) packet Take 17 g by mouth daily. 14 each 6   No current facility-administered medications for this visit.    Allergies:   Review of patient's allergies indicates no known allergies.   Past Medical History  Diagnosis Date  . High blood pressure   . High cholesterol   . PERIPHERAL EDEMA 09/13/2009    Qualifier: Diagnosis of  By: Jonny Ruiz MD, Len Blalock   . Paroxysmal atrial fibrillation 09/13/2014  . Pacemaker-Biotronik 09/13/2014    Past Surgical History  Procedure Laterality Date  . Abdominal hysterectomy    . Tonsillectomy    . Back operation    . Cholecystectomy       Social History:  The patient  reports that she has never smoked. She has never used smokeless tobacco. She reports that she does not drink alcohol or use illicit drugs.   Family History:  The patient's family history includes Cancer in her father; Diabetes in her brother; Diverticulitis in her sister; Heart disease in  her sister.    ROS:  Please see the history of present illness. All other systems are reviewed and  Negative to the above problem except as noted.    PHYSICAL EXAM: VS:  BP 124/76 mmHg  Pulse 80  Ht 5' (1.524 m)  Wt 171 lb 12.8 oz (77.928 kg)  BMI 33.55 kg/m2  SpO2 98%  GEN: Well nourished, well developed, in no acute distress HEENT: normal Neck: no JVD, carotid bruits, or masses Cardiac: RRR; no murmurs, rubs, or gallops,no edema  Respiratory:  clear to auscultation bilaterally, normal work of breathing GI: soft, nontender, nondistended, + BS  No hepatomegaly  MS: no deformity Moving all extremities   Skin: warm and dry, no rash Neuro:  Strength and sensation are intact Psych: euthymic mood, full affect   EKG:  EKG is not ordered today.   Lipid Panel    Component Value Date/Time   CHOL 197 07/18/2014 0946   CHOL 186 03/08/2009 1034   TRIG 75 07/18/2014 0946   HDL 56 07/18/2014 0946   HDL 52.40 03/08/2009 1034   CHOLHDL 3.5 07/18/2014 0946   CHOLHDL 4 03/08/2009 1034   VLDL 15.8 03/08/2009 1034   LDLCALC 126* 07/18/2014 0946   LDLCALC  118* 03/08/2009 1034      Wt Readings from Last 3 Encounters:  09/29/14 171 lb 12.8 oz (77.928 kg)  09/13/14 171 lb 6.4 oz (77.747 kg)  09/01/14 157 lb (71.215 kg)      ASSESSMENT AND PLAN: 1  Atrial fib  WOuld rx with Eliquis  5 bid   2.  LE edema  Would recomm increasing lasix to 40 alt with 20 qd  On days takes 40 would recom 20 KCl.  F/U bmet in 10 days  3.  HTN  OK  Follow .     F/U in 6 wks. Signed, Dietrich PatesPaula Contessa Preuss, MD  09/29/2014 10:00 AM    [facility] [address], [place], [place]  [address] Phone: [phone]; Fax: [phone]

## 2014-09-29 NOTE — Telephone Encounter (Signed)
Prior auth for Eliquis  ent to [facility] via Cover My Meds.

## 2014-09-29 NOTE — Patient Instructions (Signed)
Your physician has recommended you make the following change in your medication:  1.) start Eliquis 5 mg twice daily 2.) change Lasix to 20 mg, alternating with 40 mg EVERY OTHER DAY 3.) ON DAYS THAT YOU TAKE 40 MG LASIX--take potassium chloride 20 meq--1 packet dissolved in liquid  Your physician recommends that you return for lab work in: 10-14 days (BMET, BNP)  Your physician recommends that you schedule a follow-up appointment in: 6 weeks with Dr. Tenny Crawoss.

## 2014-09-30 ENCOUNTER — Telehealth: Payer: Self-pay

## 2014-09-30 ENCOUNTER — Other Ambulatory Visit: Payer: Self-pay

## 2014-09-30 MED ORDER — POTASSIUM CHLORIDE 20 MEQ PO PACK: 20.0000 meq | PACK | ORAL | Status: DC

## 2014-09-30 NOTE — Telephone Encounter (Signed)
Approval for Eliquis 5mg  from [employer] [facility] for one year, till 09/29/2015. PA #[id number].

## 2014-10-11 ENCOUNTER — Other Ambulatory Visit (INDEPENDENT_AMBULATORY_CARE_PROVIDER_SITE_OTHER): Payer: Medicare Other | Admitting: *Deleted

## 2014-10-11 DIAGNOSIS — I82402 Acute embolism and thrombosis of unspecified deep veins of left lower extremity: Secondary | ICD-10-CM | POA: Diagnosis not present

## 2014-10-11 DIAGNOSIS — R609 Edema, unspecified: Secondary | ICD-10-CM

## 2014-10-11 DIAGNOSIS — Z950 Presence of cardiac pacemaker: Secondary | ICD-10-CM

## 2014-10-11 DIAGNOSIS — R001 Bradycardia, unspecified: Secondary | ICD-10-CM | POA: Diagnosis not present

## 2014-10-11 DIAGNOSIS — I480 Paroxysmal atrial fibrillation: Secondary | ICD-10-CM

## 2014-10-11 DIAGNOSIS — K219 Gastro-esophageal reflux disease without esophagitis: Secondary | ICD-10-CM | POA: Diagnosis not present

## 2014-10-11 LAB — BASIC METABOLIC PANEL
BUN: 16 mg/dL (ref 6–23)
CO2: 28 meq/L (ref 19–32)
Calcium: 9.9 mg/dL (ref 8.4–10.5)
Chloride: 102 mEq/L (ref 96–112)
Creatinine, Ser: 0.67 mg/dL (ref 0.40–1.20)
GFR: 107.12 mL/min (ref >60.00)
GLUCOSE: 79 mg/dL (ref 70–99)
Potassium: 4.1 mEq/L (ref 3.5–5.1)
Sodium: 138 mEq/L (ref 135–145)

## 2014-10-11 LAB — BRAIN NATRIURETIC PEPTIDE: PRO B NATRI PEPTIDE: 14 pg/mL (ref 0.0–100.0)

## 2014-10-14 ENCOUNTER — Telehealth: Payer: Self-pay

## 2014-10-14 MED ORDER — POTASSIUM CHLORIDE ER 10 MEQ PO TBCR: 20.0000 meq | EXTENDED_RELEASE_TABLET | ORAL | Status: DC

## 2014-10-14 NOTE — Telephone Encounter (Signed)
If she is not taking KCL for the past couple wks and labs looked ok then it is ok not to take  Her potassium was normal If she has been taking then I would continue and she can have capsule  I am not sure why one foot is swollen and red  Checkwith pri

## 2014-10-14 NOTE — Telephone Encounter (Signed)
Insurance will not cover K Cl 74mq powder. Can we switch it to capsules please? She has been without it, although B Met was good.

## 2014-10-14 NOTE — Telephone Encounter (Signed)
Called and talked to the son.  I cancelled the KCL powder and ordered the KCL tablets taking 20 meq in days she takes the 40 mg of lasix  She continues to take alternate her lasix dose 40 mg and 20 mg  I told her son to follow up with her PCP, Dr. Montez Moritaarter, for the foot redness

## 2014-10-27 ENCOUNTER — Telehealth: Payer: Self-pay | Admitting: Internal Medicine

## 2014-10-27 NOTE — Telephone Encounter (Signed)
Records rec from [facility] placed in chart prep bin.

## 2014-10-31 NOTE — Progress Notes (Signed)
Cardiology Office Note   Date:  11/03/2014   ID:  Tara Hughes, DOB 1925/11/19, MRN [id number]  PCP:  [name], DO  Cardiologist:   Dietrich Pates, MD   No chief complaint on file. Pt presents for f/u of BP and edema      History of Present Illness: Tara Hughes is a 81 y.o. female with a history of HTN, CP, s/p PPM and intermitt trial fib  I saw the pt back on June 30  At that time I recomm adding eliquis t oregimn  I also recom alt lasix 40 with 20 KCL for LE edema    Pt improved some but then about 2wks ago swelling came back  Legs hurt Pt not mobile       Current Outpatient Prescriptions  Medication Sig Dispense Refill  . furosemide (LASIX) 20 MG tablet Take 20 mg by mouth, alternating with 40 mg by mouth every other day.    . lisinopril (PRINIVIL,ZESTRIL) 5 MG tablet Take 1 tablet (5 mg total) by mouth daily. 90 tablet 3  . omeprazole (PRILOSEC) 20 MG capsule Take 1 capsule (20 mg total) by mouth daily. 90 capsule 3  . polyethylene glycol (MIRALAX / GLYCOLAX) packet Take 17 g by mouth daily. 14 each 6  . potassium chloride (K-DUR) 10 MEQ tablet Take 2 tablets (20 mEq total) by mouth 3 (three) times a week. 90 tablet 3  . apixaban (ELIQUIS) 5 MG TABS tablet Take 1 tablet (5 mg total) by mouth 2 (two) times daily. (Patient not taking: Reported on 11/03/2014) 60 tablet 12   No current facility-administered medications for this visit.    Allergies:   Review of patient's allergies indicates no known allergies.   Past Medical History  Diagnosis Date  . High blood pressure   . High cholesterol   . PERIPHERAL EDEMA 09/13/2009    Qualifier: Diagnosis of  By: Jonny Ruiz MD, Len Blalock   . Paroxysmal atrial fibrillation 09/13/2014  . Pacemaker-Biotronik 09/13/2014    Past Surgical History  Procedure Laterality Date  . Abdominal hysterectomy    . Tonsillectomy    . Back operation    . Cholecystectomy       Social History:  The patient  reports that she has never  smoked. She has never used smokeless tobacco. She reports that she does not drink alcohol or use illicit drugs.   Family History:  The patient's family history includes Cancer in her father; Diabetes in her brother; Diverticulitis in her sister; Heart disease in her sister.    ROS:  Please see the history of present illness. All other systems are reviewed and  Negative to the above problem except as noted.    PHYSICAL EXAM: VS:  BP 120/80 mmHg  Pulse 78  Ht 5' (1.524 m)  Wt   WJX:BJYNWG 81 yo in no acute distressDoesnt move much in chair   HEENT: normal Neck: Difficult to asses JVP   Cardiac: RRR; no murmurs, rubs, or gallops2 + edema  Respiratory:  clear to auscultation bilaterally, normal work of breathing GI: soft, nontender, nondistended, + BS  No hepatomegaly  MS: no deformity Moving all extremities   Skin: warm and dry, no rash Neuro:  Strength and sensation are intact Psych: euthymic mood, full affect   EKG:  EKG is not ordered today.   Lipid Panel    Component Value Date/Time   CHOL 197 07/18/2014 0946   CHOL 186 03/08/2009 1034   TRIG 75  07/18/2014 0946   HDL 56 07/18/2014 0946   HDL 52.40 03/08/2009 1034   CHOLHDL 3.5 07/18/2014 0946   CHOLHDL 4 03/08/2009 1034   VLDL 15.8 03/08/2009 1034   LDLCALC 126* 07/18/2014 0946   LDLCALC 118* 03/08/2009 1034      Wt Readings from Last 3 Encounters:  09/29/14 171 lb 12.8 oz (77.928 kg)  09/13/14 171 lb 6.4 oz (77.747 kg)  09/01/14 157 lb (71.215 kg)      ASSESSMENT AND PLAN: 1  Atrial fib  Pt did not get eliquis yet  Didntt know it was approved  Will fill    2.  HTN Adequate control    3.  Edema  Edema came back  WIll get labs  Determine lasix dose    F/U in 2 months      F/U Signed, Dietrich Pates, MD  11/03/2014 10:09 AM    [facility] [address] [place], [place], [place]  [address] Phone: [phone]; Fax: [phone]

## 2014-11-03 ENCOUNTER — Encounter: Payer: Self-pay | Admitting: Internal Medicine

## 2014-11-03 ENCOUNTER — Ambulatory Visit (INDEPENDENT_AMBULATORY_CARE_PROVIDER_SITE_OTHER): Payer: Medicare Other | Admitting: Internal Medicine

## 2014-11-03 ENCOUNTER — Telehealth: Payer: Self-pay | Admitting: *Deleted

## 2014-11-03 VITALS — BP 120/80 | HR 78 | Ht 60.0 in

## 2014-11-03 DIAGNOSIS — I480 Paroxysmal atrial fibrillation: Secondary | ICD-10-CM | POA: Diagnosis not present

## 2014-11-03 NOTE — Patient Instructions (Addendum)
Medication Instructions:  Your physician recommends that you continue on your current medications as directed. Please refer to the Current Medication list given to you today. Please pick up and resume the Rx for Eliquis that was previously sent to you pharmacy  Labwork: Bmet, Bnp  Today  Testing/Procedures: None  Follow-Up: Your physician recommends that you schedule a follow-up appointment in: 2 months with Dr.Ross   Any Other Special Instructions Will Be Listed Below (If Applicable).

## 2014-11-03 NOTE — Telephone Encounter (Signed)
tammi at [facility], she will be getting the script ready.

## 2014-11-23 ENCOUNTER — Ambulatory Visit (INDEPENDENT_AMBULATORY_CARE_PROVIDER_SITE_OTHER): Payer: Medicare Other | Admitting: Internal Medicine

## 2014-11-23 ENCOUNTER — Encounter: Payer: Self-pay | Admitting: Internal Medicine

## 2014-11-23 VITALS — BP 140/88 | HR 103 | Temp 97.9°F | Resp 20 | Ht 60.0 in | Wt 173.4 lb

## 2014-11-23 DIAGNOSIS — M25579 Pain in unspecified ankle and joints of unspecified foot: Secondary | ICD-10-CM | POA: Diagnosis not present

## 2014-11-23 DIAGNOSIS — M159 Polyosteoarthritis, unspecified: Secondary | ICD-10-CM

## 2014-11-23 DIAGNOSIS — R441 Visual hallucinations: Secondary | ICD-10-CM | POA: Diagnosis not present

## 2014-11-23 DIAGNOSIS — I10 Essential (primary) hypertension: Secondary | ICD-10-CM | POA: Diagnosis not present

## 2014-11-23 DIAGNOSIS — I480 Paroxysmal atrial fibrillation: Secondary | ICD-10-CM

## 2014-11-23 DIAGNOSIS — K219 Gastro-esophageal reflux disease without esophagitis: Secondary | ICD-10-CM

## 2014-11-23 DIAGNOSIS — Z950 Presence of cardiac pacemaker: Secondary | ICD-10-CM | POA: Diagnosis not present

## 2014-11-23 DIAGNOSIS — M150 Primary generalized (osteo)arthritis: Secondary | ICD-10-CM

## 2014-11-23 DIAGNOSIS — E785 Hyperlipidemia, unspecified: Secondary | ICD-10-CM

## 2014-11-23 DIAGNOSIS — R609 Edema, unspecified: Secondary | ICD-10-CM | POA: Diagnosis not present

## 2014-11-23 DIAGNOSIS — M8949 Other hypertrophic osteoarthropathy, multiple sites: Secondary | ICD-10-CM

## 2014-11-23 NOTE — Progress Notes (Signed)
Patient ID: Tara Hughes, female   DOB: 06/11/1925, 81 y.o.   MRN: [id number]    Location:    [facility]   Place of Service:  OFFICE   Chief Complaint  Patient presents with  . Medical Management of Chronic Issues    3 month follow-up    HPI:  81 yo female seen today for f/u. She c/o throbbing in her feet that improved after being massaged. Tylenol ES helped. She does not take any other pain meds. Pain interrupts sleep.  PAF on eliquis- no easy bruIsing. Rate controlled most days. She has a pacemaker  Edema - fluctuates. She takes lasix daily along Potassium supplement  BP stable lisinopril and lasix  GERD - stable on omeprazole daily  Past Medical History  Diagnosis Date  . High blood pressure   . High cholesterol   . PERIPHERAL EDEMA 09/13/2009    Qualifier: Diagnosis of  By: Jonny Ruiz MD, Len Blalock   . Paroxysmal atrial fibrillation 09/13/2014  . Pacemaker-Biotronik 09/13/2014    Past Surgical History  Procedure Laterality Date  . Abdominal hysterectomy    . Tonsillectomy    . Back operation    . Cholecystectomy      Patient Care Team: [name], DO as PCP - General (Internal Medicine)  Social History   Social History  . Marital Status: Widowed    Spouse Name: N/A  . Number of Children: N/A  . Years of Education: N/A   Occupational History  . Not on file.   Social History Main Topics  . Smoking status: Never Smoker   . Smokeless tobacco: Never Used  . Alcohol Use: No  . Drug Use: No  . Sexual Activity: Not on file   Other Topics Concern  . Not on file   Social History Narrative   Diet:      Do you drink/ eat things with caffeine? Coffee,soda      Marital status: Widow                              What year were you married ?      Do you live in a house, apartment,assistred living, condo, trailer, etc.)?Yes, House      Is it one or more stories? 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                              Type & how often:      Do you have a living will? No      Do you have a DNR form?  No                      If not, do you want to discuss one? No      Do you have signed POA?HPOA forms? Yes                If so, please bring to your        appointment           reports that she has never smoked. She has never used smokeless tobacco.  She reports that she does not drink alcohol or use illicit drugs.  No Known Allergies  Medications: Patient's Medications  New Prescriptions   No medications on file  Previous Medications   APIXABAN (ELIQUIS) 5 MG TABS TABLET    Take 1 tablet (5 mg total) by mouth 2 (two) times daily.   FUROSEMIDE (LASIX) 20 MG TABLET    Take 20 mg by mouth, alternating with 40 mg by mouth every other day.   LISINOPRIL (PRINIVIL,ZESTRIL) 5 MG TABLET    Take 1 tablet (5 mg total) by mouth daily.   OMEPRAZOLE (PRILOSEC) 20 MG CAPSULE    Take 1 capsule (20 mg total) by mouth daily.   POLYETHYLENE GLYCOL (MIRALAX / GLYCOLAX) PACKET    Take 17 g by mouth daily.   POTASSIUM CHLORIDE (K-DUR) 10 MEQ TABLET    Take 2 tablets (20 mEq total) by mouth 3 (three) times a week.  Modified Medications   No medications on file  Discontinued Medications   No medications on file    Review of Systems  Constitutional: Negative for fever, chills, diaphoresis, activity change, appetite change and fatigue.  HENT: Negative for ear pain and sore throat.   Eyes: Negative for visual disturbance.  Respiratory: Negative for cough, chest tightness and shortness of breath.   Cardiovascular: Negative for chest pain, palpitations and leg swelling.  Gastrointestinal: Negative for nausea, vomiting, abdominal pain, diarrhea, constipation and blood in stool.  Genitourinary: Negative for dysuria.  Musculoskeletal: Positive for joint swelling, arthralgias and gait problem.  Neurological:  Negative for dizziness, tremors, numbness and headaches.  Psychiatric/Behavioral: Positive for hallucinations (visual - sees snakes/insects) and confusion. Negative for behavioral problems, sleep disturbance, self-injury and agitation. The patient is not nervous/anxious.     Filed Vitals:   11/23/14 1037  BP: 140/88  Pulse: 103  Temp: 97.9 F (36.6 C)  TempSrc: Oral  Resp: 20  Height: 5' (1.524 m)  Weight: 173 lb 6.4 oz (78.654 kg)  SpO2: 96%   Body mass index is 33.86 kg/(m^2).  Physical Exam  Constitutional: She is oriented to person, place, and time. She appears well-developed and well-nourished.  HENT:  Mouth/Throat: Oropharynx is clear and moist. No oropharyngeal exudate.  Eyes: Pupils are equal, round, and reactive to light. No scleral icterus.  Neck: Neck supple. Carotid bruit is not present. No tracheal deviation present. No thyromegaly present.  Cardiovascular: Regular rhythm and intact distal pulses.  Tachycardia present.  Exam reveals no gallop and no friction rub.   Murmur (1/6 SEM) heard. L>RLE edema +1 pitting. no calf TTP.   Pulmonary/Chest: Effort normal and breath sounds normal. No stridor. No respiratory distress. She has no wheezes. She has no rales.  Abdominal: Soft. Bowel sounds are normal. She exhibits no distension and no mass. There is no hepatomegaly. There is no tenderness. There is no rebound and no guarding.  Musculoskeletal: She exhibits edema and tenderness.  Lymphadenopathy:    She has no cervical adenopathy.  Neurological: She is alert and oriented to person, place, and time. She has normal reflexes.  Skin: Skin is warm and dry. No rash noted.  Psychiatric: She has a normal mood and affect. Her behavior is normal. Judgment and thought content normal.     Labs reviewed: Lab on 10/11/2014  Component Date Value Ref Range Status  . Sodium 10/11/2014 138  135 - 145 mEq/L Final  . Potassium 10/11/2014 4.1  3.5 - 5.1 mEq/L Final  . Chloride  10/11/2014 102  96 -  112 mEq/L Final  . CO2 10/11/2014 28  19 - 32 mEq/L Final  . Glucose, Bld 10/11/2014 79  70 - 99 mg/dL Final  . BUN 42/59/5638 16  6 - 23 mg/dL Final  . Creatinine, Ser 10/11/2014 0.67  0.40 - 1.20 mg/dL Final  . Calcium 75/64/3329 9.9  8.4 - 10.5 mg/dL Final  . GFR 51/88/4166 107.12  >60.00 mL/min Final  . Pro B Natriuretic peptide (BNP) 10/11/2014 14.0  0.0 - 100.0 pg/mL Final  Lab on 09/26/2014  Component Date Value Ref Range Status  . Sodium 09/26/2014 138  135 - 145 mEq/L Final  . Potassium 09/26/2014 3.6  3.5 - 5.1 mEq/L Final  . Chloride 09/26/2014 104  96 - 112 mEq/L Final  . CO2 09/26/2014 29  19 - 32 mEq/L Final  . Glucose, Bld 09/26/2014 87  70 - 99 mg/dL Final  . BUN 09/29/1599 13  6 - 23 mg/dL Final  . Creatinine, Ser 09/26/2014 0.70  0.40 - 1.20 mg/dL Final  . Calcium 09/32/3557 9.6  8.4 - 10.5 mg/dL Final  . GFR 32/20/2542 101.85  >60.00 mL/min Final  Office Visit on 09/13/2014  Component Date Value Ref Range Status  . Pulse Generator Manufacturer 09/13/2014 BITR   Preliminary  . Date Time Interrogation Session 09/13/2014 [id number]   Preliminary  . Pulse Gen Model 09/13/2014 Evia DR-T   Preliminary  . Pulse Gen Serial Number 09/13/2014 [id number]   Preliminary  . Implantable Pulse Generator Type 09/13/2014 Implantable Pulse Generator   Preliminary  . Implantable Pulse Generator Implan* 09/13/2014 [id number]   Preliminary  . Lead Channel Setting Sensing Sensi* 09/13/2014 2.5   Preliminary  . Lead Channel Setting Pacing Amplit* 09/13/2014 1.5   Preliminary  . Lead Channel Setting Pacing Pulse * 09/13/2014 0.4   Preliminary  . Lead Channel Setting Pacing Amplit* 09/13/2014 2.5   Preliminary  . Zone Setting Type Category 09/13/2014 BRADYCARDIA   Preliminary  . Lead Channel Impedance Value 09/13/2014 448   Preliminary  . Lead Channel Sensing Intrinsic Amp* 09/13/2014 2.6   Preliminary  . Lead Channel Pacing Threshold Ampl* 09/13/2014 0.5    Preliminary  . Lead Channel Pacing Threshold Puls* 09/13/2014 0.4   Preliminary  . Lead Channel Impedance Value 09/13/2014 604   Preliminary  . Lead Channel Sensing Intrinsic Amp* 09/13/2014 15   Preliminary  . Lead Channel Pacing Threshold Ampl* 09/13/2014 0.6   Preliminary  . Lead Channel Pacing Threshold Puls* 09/13/2014 0.4   Preliminary  . Battery Remaining Percentage 09/13/2014 80   Preliminary  . Huston Foley Statistic RA Percent Paced 09/13/2014 26   Preliminary  . Huston Foley Statistic RV Percent Paced 09/13/2014 1   Preliminary  . Eval Rhythm 09/13/2014 SR@64  w/ PVCs   Preliminary  Office Visit on 09/01/2014  Component Date Value Ref Range Status  . Sodium 09/01/2014 137  135 - 145 mEq/L Final  . Potassium 09/01/2014 3.8  3.5 - 5.1 mEq/L Final  . Chloride 09/01/2014 104  96 - 112 mEq/L Final  . CO2 09/01/2014 25  19 - 32 mEq/L Final  . Glucose, Bld 09/01/2014 82  70 - 99 mg/dL Final  . BUN 38/18/2993 13  6 - 23 mg/dL Final  . Creatinine, Ser 09/01/2014 0.71  0.40 - 1.20 mg/dL Final  . Calcium 71/69/6789 9.5  8.4 - 10.5 mg/dL Final  . GFR 38/12/1749 100.21  >60.00 mL/min Final  . Pro B Natriuretic peptide (BNP) 09/01/2014 30.0  0.0 - 100.0 pg/mL Final  No results found.   Assessment/Plan   ICD-9-CM ICD-10-CM   1. Pain in joint, ankle and foot, unspecified laterality - worse; probably due to #2 719.47 M25.579 Uric Acid  2. Primary osteoarthritis involving multiple joints 715.09 M15.0   3. Essential hypertension, benign - stable 401.1 I10   4. PERIPHERAL EDEMA - stable 782.3 R60.9 CMP  5. Gastroesophageal reflux disease without esophagitis - stable 530.81 K21.9   6. Pacemaker - due to #7; stable V45.01 Z95.0   7. Paroxysmal atrial fibrillation - rate controlled 427.31 I48.0 CMP     CBC with Differential  8. Hyperlipidemia - stable 272.4 E78.5 Lipid Panel  9. Visual hallucination - stable 368.16 R44.1    Family would like to hold off on tx of visual hallucinations at this time  stating they are not causing her to wonder or be violent  Continue current medications as ordered  Recommend Tylenol ES 1 gram at least 3 times daily  Follow up with cardiology as scheduled  Will call with lab results  Follow up in 3 mos for routine visit  Dreyton Roessner S. Ancil Linsey  [facility] [address] [place], [place] [address] [phone] Cell (Monday-Friday 8 AM - 5 PM) [phone] After 5 PM and follow prompts

## 2014-11-23 NOTE — Patient Instructions (Addendum)
Continue current medications as ordered  Recommend Tylenol ES 1 gram at least 3 times daily  Follow up with cardiology as scheduled  Will call with lab results  Follow up in 3 mos for routine visit

## 2014-11-24 LAB — COMPREHENSIVE METABOLIC PANEL
ALBUMIN: 3.9 g/dL (ref 3.5–4.7)
ALT: 5 IU/L (ref 0–32)
AST: 7 IU/L (ref 0–40)
Albumin/Globulin Ratio: 1.1 (ref 1.1–2.5)
Alkaline Phosphatase: 90 IU/L (ref 39–117)
BILIRUBIN TOTAL: 0.2 mg/dL (ref 0.0–1.2)
BUN / CREAT RATIO: 21 (ref 11–26)
BUN: 16 mg/dL (ref 8–27)
CALCIUM: 9.2 mg/dL (ref 8.7–10.3)
CHLORIDE: 104 mmol/L (ref 97–108)
CO2: 23 mmol/L (ref 18–29)
CREATININE: 0.78 mg/dL (ref 0.57–1.00)
GFR calc non Af Amer: 69 mL/min/{1.73_m2} (ref >59)
GFR, EST AFRICAN AMERICAN: 80 mL/min/{1.73_m2} (ref >59)
GLUCOSE: 123 mg/dL — AB (ref 65–99)
Globulin, Total: 3.5 g/dL (ref 1.5–4.5)
Potassium: 3.9 mmol/L (ref 3.5–5.2)
Sodium: 144 mmol/L (ref 134–144)
TOTAL PROTEIN: 7.4 g/dL (ref 6.0–8.5)

## 2014-11-24 LAB — LIPID PANEL
CHOL/HDL RATIO: 3.6 ratio (ref 0.0–4.4)
CHOLESTEROL TOTAL: 174 mg/dL (ref 100–199)
HDL: 48 mg/dL (ref >39)
LDL CALC: 106 mg/dL — AB (ref 0–99)
TRIGLYCERIDES: 100 mg/dL (ref 0–149)
VLDL CHOLESTEROL CAL: 20 mg/dL (ref 5–40)

## 2014-11-24 LAB — URIC ACID: Uric Acid: 5.2 mg/dL (ref 2.5–7.1)

## 2014-11-24 LAB — CBC WITH DIFFERENTIAL/PLATELET
BASOS ABS: 0 10*3/uL (ref 0.0–0.2)
Basos: 0 %
EOS (ABSOLUTE): 0.2 10*3/uL (ref 0.0–0.4)
EOS: 3 %
HEMATOCRIT: 37.1 % (ref 34.0–46.6)
HEMOGLOBIN: 12.2 g/dL (ref 11.1–15.9)
IMMATURE GRANS (ABS): 0 10*3/uL (ref 0.0–0.1)
Immature Granulocytes: 0 %
LYMPHS ABS: 2.2 10*3/uL (ref 0.7–3.1)
LYMPHS: 30 %
MCH: 29.5 pg (ref 26.6–33.0)
MCHC: 32.9 g/dL (ref 31.5–35.7)
MCV: 90 fL (ref 79–97)
MONOCYTES: 10 %
Monocytes Absolute: 0.8 10*3/uL (ref 0.1–0.9)
NEUTROS ABS: 4.1 10*3/uL (ref 1.4–7.0)
Neutrophils: 57 %
Platelets: 298 10*3/uL (ref 150–379)
RBC: 4.13 x10E6/uL (ref 3.77–5.28)
RDW: 14.9 % (ref 12.3–15.4)
WBC: 7.3 10*3/uL (ref 3.4–10.8)

## 2014-12-13 ENCOUNTER — Ambulatory Visit (INDEPENDENT_AMBULATORY_CARE_PROVIDER_SITE_OTHER): Payer: Medicare Other | Admitting: *Deleted

## 2014-12-13 DIAGNOSIS — I480 Paroxysmal atrial fibrillation: Secondary | ICD-10-CM

## 2014-12-13 NOTE — Progress Notes (Signed)
Remote pacemaker transmission.   

## 2015-01-05 ENCOUNTER — Encounter: Payer: Self-pay | Admitting: Internal Medicine

## 2015-01-05 ENCOUNTER — Ambulatory Visit (INDEPENDENT_AMBULATORY_CARE_PROVIDER_SITE_OTHER): Payer: Medicare Other | Admitting: Internal Medicine

## 2015-01-05 VITALS — BP 118/74 | HR 66 | Ht 60.0 in | Wt 173.2 lb

## 2015-01-05 DIAGNOSIS — I480 Paroxysmal atrial fibrillation: Secondary | ICD-10-CM

## 2015-01-05 DIAGNOSIS — J810 Acute pulmonary edema: Secondary | ICD-10-CM

## 2015-01-05 DIAGNOSIS — I10 Essential (primary) hypertension: Secondary | ICD-10-CM

## 2015-01-05 NOTE — Patient Instructions (Signed)
Your physician recommends that you continue on your current medications as directed. Please refer to the Current Medication list given to you today. Your physician wants you to follow-up in: 6 months with Dr. [name].  You will receive a reminder letter in the mail two months in advance. If you don't receive a letter, please call our office to schedule the follow-up appointment.  You have been given a prescription for support/compression socks.   You can check with [facility] in [place], [place]

## 2015-01-05 NOTE — Progress Notes (Signed)
Cardiology Office Note   Date:  01/05/2015   ID:  Tara Hughes, DOB June 20, 1925, MRN [id number]  PCP:  [name], DO  Cardiologist:   Dietrich Pates, MD   Chief Complaint  Patient presents with  . Follow-up    edema       History of Present Illness: Tara Hughes is a 81 y.o. female with a history of PAF (on eliquis), Tachy-brady syndome s/p PPM Also a history of HTN.  Cardiac CT in 2010 mild soft plaque in LAD    Echo in June showed LVEF was normal  No valvular abnormalities   She was in clinic in August   Since seen she still has some edema.  No SOB   Her activity is limited     Current Outpatient Prescriptions  Medication Sig Dispense Refill  . apixaban (ELIQUIS) 5 MG TABS tablet Take 1 tablet (5 mg total) by mouth 2 (two) times daily. 60 tablet 12  . furosemide (LASIX) 20 MG tablet Take 20 mg by mouth, alternating with 40 mg by mouth every other day.    . lisinopril (PRINIVIL,ZESTRIL) 5 MG tablet Take 1 tablet (5 mg total) by mouth daily. 90 tablet 3  . omeprazole (PRILOSEC) 20 MG capsule Take 1 capsule (20 mg total) by mouth daily. 90 capsule 3  . polyethylene glycol (MIRALAX / GLYCOLAX) packet Take 17 g by mouth daily. 14 each 6  . potassium chloride (K-DUR) 10 MEQ tablet Take 2 tablets (20 mEq total) by mouth 3 (three) times a week. 90 tablet 3   No current facility-administered medications for this visit.    Allergies:   Review of patient's allergies indicates no known allergies.   Past Medical History  Diagnosis Date  . High blood pressure   . High cholesterol   . PERIPHERAL EDEMA 09/13/2009    Qualifier: Diagnosis of  By: Jonny Ruiz MD, Len Blalock   . Paroxysmal atrial fibrillation (HCC) 09/13/2014  . Pacemaker-Biotronik 09/13/2014    Past Surgical History  Procedure Laterality Date  . Abdominal hysterectomy    . Tonsillectomy    . Back operation    . Cholecystectomy       Social History:  The patient  reports that she has never smoked. She has  never used smokeless tobacco. She reports that she does not drink alcohol or use illicit drugs.   Family History:  The patient's family history includes Cancer in her father; Diabetes in her brother; Diverticulitis in her sister; Heart disease in her sister.    ROS:  Please see the history of present illness. All other systems are reviewed and  Negative to the above problem except as noted.    PHYSICAL EXAM: VS:  BP 118/74 mmHg  Pulse 66  Ht 5' (1.524 m)  Wt 173 lb 3.2 oz (78.563 kg)  BMI 33.83 kg/m2  SpO2 90%  GEN: Well nourished, well developed, in no acute distress HEENT: normal Neck: no obvious JVD Cardiac: RRR; no murmurs, rubs, or gallops, 1+ edema  Respiratory:  clear to auscultation bilaterally, normal work of breathing GI: soft, nontender, nondistended, + BS  No hepatomegaly      EKG:  EKG is not  ordered today.   Lipid Panel    Component Value Date/Time   CHOL 174 11/23/2014 1153   CHOL 186 03/08/2009 1034   TRIG 100 11/23/2014 1153   HDL 48 11/23/2014 1153   HDL 52.40 03/08/2009 1034   CHOLHDL 3.6 11/23/2014 1153  CHOLHDL 4 03/08/2009 1034   VLDL 15.8 03/08/2009 1034   LDLCALC 106* 11/23/2014 1153   LDLCALC 118* 03/08/2009 1034      Wt Readings from Last 3 Encounters:  01/05/15 173 lb 3.2 oz (78.563 kg)  11/23/14 173 lb 6.4 oz (78.654 kg)  09/29/14 171 lb 12.8 oz (77.928 kg)      ASSESSMENT AND PLAN:  1  Edema  Some pedal edema.  Overall volume status is not bad though   I would not change medicines  Would Rx support hose  2   PAF  Continue Eliquis  3.  HTN  BP is well controlled    4.  CAD  Minimal by CT Not on statin  5   PPM  Will need to confirm f/u.    Signed, Dietrich Pates, MD  01/05/2015 9:53 AM    [facility] [address] [place], [place], [place]  [address] Phone: [phone]; Fax: [phone]

## 2015-01-10 ENCOUNTER — Emergency Department (HOSPITAL_COMMUNITY): Payer: Medicare Other

## 2015-01-10 ENCOUNTER — Encounter (HOSPITAL_COMMUNITY): Payer: Self-pay | Admitting: Physical Medicine and Rehabilitation

## 2015-01-10 ENCOUNTER — Emergency Department (HOSPITAL_COMMUNITY)
Admission: EM | Admit: 2015-01-10 | Discharge: 2015-01-10 | Disposition: A | Discharge status: Home / Self Care | Payer: Medicare Other | Attending: Emergency Medicine | Admitting: Emergency Medicine

## 2015-01-10 DIAGNOSIS — Z79899 Other long term (current) drug therapy: Secondary | ICD-10-CM | POA: Diagnosis not present

## 2015-01-10 DIAGNOSIS — Z7901 Long term (current) use of anticoagulants: Secondary | ICD-10-CM | POA: Diagnosis not present

## 2015-01-10 DIAGNOSIS — M79672 Pain in left foot: Secondary | ICD-10-CM | POA: Diagnosis not present

## 2015-01-10 DIAGNOSIS — I480 Paroxysmal atrial fibrillation: Secondary | ICD-10-CM | POA: Insufficient documentation

## 2015-01-10 DIAGNOSIS — Z950 Presence of cardiac pacemaker: Secondary | ICD-10-CM | POA: Diagnosis not present

## 2015-01-10 DIAGNOSIS — G8929 Other chronic pain: Secondary | ICD-10-CM | POA: Insufficient documentation

## 2015-01-10 DIAGNOSIS — M79671 Pain in right foot: Secondary | ICD-10-CM | POA: Diagnosis present

## 2015-01-10 DIAGNOSIS — Z8639 Personal history of other endocrine, nutritional and metabolic disease: Secondary | ICD-10-CM | POA: Insufficient documentation

## 2015-01-10 DIAGNOSIS — I10 Essential (primary) hypertension: Secondary | ICD-10-CM | POA: Insufficient documentation

## 2015-01-10 MED ORDER — OXYCODONE HCL 5 MG PO TABS
2.5000 mg | ORAL_TABLET | Freq: Once | ORAL | Status: AC
  Administered 2015-01-10: 2.5 mg via ORAL
  Filled 2015-01-10: qty 1

## 2015-01-10 MED ORDER — ACETAMINOPHEN 500 MG PO TABS
1000.0000 mg | ORAL_TABLET | Freq: Once | ORAL | Status: AC
  Administered 2015-01-10: 1000 mg via ORAL
  Filled 2015-01-10: qty 2

## 2015-01-10 NOTE — ED Notes (Signed)
Pt presents to ED via [facility] for evaluation of bilateral leg/arm/foot pain. Also states swelling to bilateral lower extremities. States chronic issues with pain to extremities. Pt is alert and oriented x4.

## 2015-01-10 NOTE — ED Provider Notes (Signed)
CSN: [id number]     Arrival date & time 01/10/15  1029 History   First MD Initiated Contact with Patient 01/10/15 1031     Chief Complaint  Patient presents with  . Leg Pain  . Foot Pain     (Consider location/radiation/quality/duration/timing/severity/associated sxs/prior Treatment) Patient is a 81 y.o. female presenting with lower extremity pain. The history is provided by the patient.  Foot Pain This is a chronic problem. The current episode started more than 1 week ago. The problem occurs constantly. The problem has been gradually worsening. Pertinent negatives include no chest pain, no headaches and no shortness of breath. The symptoms are aggravated by walking and stress. Nothing relieves the symptoms. She has tried nothing for the symptoms. The treatment provided no relief.   81 yo F with a chief complaint of bilateral foot pain. Been going on for quite some time. Patient has seen her PCP multiple times for this. Was determined that was likely arthritis in nature. Patient was told to take Tylenol and follow-up. Patient felt that her pain was worse last night and into the morning. Having some increased pain with walking today. Denies fevers or chills. Patient has some lower extremity edema is being treated with compression stockings.   Past Medical History  Diagnosis Date  . High blood pressure   . High cholesterol   . PERIPHERAL EDEMA 09/13/2009    Qualifier: Diagnosis of  By: Jonny Ruiz MD, Len Blalock   . Paroxysmal atrial fibrillation (HCC) 09/13/2014  . Pacemaker-Biotronik 09/13/2014   Past Surgical History  Procedure Laterality Date  . Abdominal hysterectomy    . Tonsillectomy    . Back operation    . Cholecystectomy     Family History  Problem Relation Age of Onset  . Cancer Father   . Diabetes Brother   . Diverticulitis Sister   . Heart disease Sister    Social History  Substance Use Topics  . Smoking status: Never Smoker   . Smokeless tobacco: Never Used  . Alcohol Use:  No   OB History    No data available     Review of Systems  Constitutional: Negative for fever and chills.  HENT: Negative for congestion and rhinorrhea.   Eyes: Negative for redness and visual disturbance.  Respiratory: Negative for shortness of breath and wheezing.   Cardiovascular: Negative for chest pain and palpitations.  Gastrointestinal: Negative for nausea and vomiting.  Genitourinary: Negative for dysuria and urgency.  Musculoskeletal: Positive for myalgias and arthralgias.  Skin: Negative for pallor and wound.  Neurological: Negative for dizziness and headaches.      Allergies  Review of patient's allergies indicates no known allergies.  Home Medications   Prior to Admission medications   Medication Sig Start Date End Date Taking? Authorizing Provider  acetaminophen (TYLENOL) 325 MG tablet Take 650 mg by mouth every 6 (six) hours as needed for mild pain.   Yes Historical Provider, MD  apixaban (ELIQUIS) 5 MG TABS tablet Take 1 tablet (5 mg total) by mouth 2 (two) times daily. 09/29/14  Yes Pricilla Riffle, MD  furosemide (LASIX) 20 MG tablet Take 20 mg by mouth, alternating with 40 mg by mouth every other day.   Yes Historical Provider, MD  omeprazole (PRILOSEC) 20 MG capsule Take 1 capsule (20 mg total) by mouth daily. 09/29/14  Yes Pricilla Riffle, MD  polyethylene glycol [facility] / GLYCOLAX) packet Take 17 g by mouth daily. 07/15/14  Yes [name], DO  potassium chloride (  K-DUR) 10 MEQ tablet Take 2 tablets (20 mEq total) by mouth 3 (three) times a week. 10/14/14  Yes Pricilla Riffle, MD  lisinopril (PRINIVIL,ZESTRIL) 5 MG tablet Take 1 tablet (5 mg total) by mouth daily. Patient not taking: Reported on 01/10/2015 09/29/14   Pricilla Riffle, MD   BP 122/40 mmHg  Pulse 77  Temp(Src) 97.8 F (36.6 C) (Oral)  Resp 18  SpO2 99% Physical Exam  Constitutional: She is oriented to person, place, and time. She appears well-developed and well-nourished. No distress.  HENT:  Head:  Normocephalic and atraumatic.  Eyes: EOM are normal. Pupils are equal, round, and reactive to light.  Neck: Normal range of motion. Neck supple.  Cardiovascular: Normal rate and regular rhythm.  Exam reveals no gallop and no friction rub.   No murmur heard. Pulmonary/Chest: Effort normal. She has no wheezes. She has no rales.  Abdominal: Soft. She exhibits no distension. There is no tenderness. There is no rebound and no guarding.  Musculoskeletal: She exhibits edema (significant edema worse on the left than the right. Chronic per patient.Marland Kitchen). She exhibits no tenderness.  Neurological: She is alert and oriented to person, place, and time.  Skin: Skin is warm and dry. She is not diaphoretic.  Psychiatric: She has a normal mood and affect. Her behavior is normal.    ED Course  Procedures (including critical care time) Labs Review Labs Reviewed - No data to display  Imaging Review Dg Foot Complete Left  01/10/2015   CLINICAL DATA:  81 year old with chronic bilateral foot pain and swelling, acutely worsened earlier today (pain 8/10). No recent injuries.  EXAM: LEFT FOOT - COMPLETE 3+ VIEW  COMPARISON:  None.  FINDINGS: Severe osseous demineralization. 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 No evidence of acute or subacute fracture. Narrowing of IP joint spaces involving multiple toes and mild narrowing of joint spaces in the midfoot. No erosions. Dorsal soft tissue swelling. Large os perineum adjacent to the cuboid bone and os tibiale externum adjacent to the tarsal navicular, accessory ossicles.  IMPRESSION: 1. No acute or subacute osseous abnormality. 2. Severe osseous demineralization. 3. Osteoarthritis.   Electronically Signed   By: Hulan Saas M.D.   On: 01/10/2015 11:31   Dg Foot Complete Right  01/10/2015   CLINICAL DATA:  Foot pain, leg pain, arm pain.  EXAM: RIGHT FOOT COMPLETE - 3+ VIEW  COMPARISON:  None.  FINDINGS: There is severe osteopenia. There is no acute fracture or  dislocation. There is at erosion involving the lateral aspect of the first metatarsal head. There is no other erosive change. 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 The soft tissues are unremarkable.  IMPRESSION: 1. No acute osseous injury of the right foot. 2. Erosion involving the first metatarsal head which may be secondary an inflammatory (rheumatoid arthritis) or crystalline arthropathy (gout).   Electronically Signed   By: Elige Ko   On: 01/10/2015 11:33   I have personally reviewed and evaluated these images and lab results as part of my medical decision-making.   EKG Interpretation None      MDM   Final diagnoses:  Foot pain, bilateral    81 yo F with a chief complaint of bilateral foot pain. Appears to be chronic in nature. Denies injury. X-rays bilaterally without significant findings. Patient controlled here with Tylenol and half a tablet of Roxi.  Patient has swelling greater to the left lower extremity than the right. This is chronic per the family.  We'll have her follow-up with her PCP.  12:31 PM:  I have discussed the diagnosis/risks/treatment options with the patient and family and believe the pt to be eligible for discharge home to follow-up with PCP. We also discussed returning to the ED immediately if new or worsening sx occur. We discussed the sx which are most concerning (e.g., sudden worsening pain, fever) that necessitate immediate return. Medications administered to the patient during their visit and any new prescriptions provided to the patient are listed below.  Medications given during this visit Medications  acetaminophen (TYLENOL) tablet 1,000 mg (1,000 mg Oral Given 01/10/15 1131)  oxyCODONE (Oxy IR/ROXICODONE) immediate release tablet 2.5 mg (2.5 mg Oral Given 01/10/15 1132)    Discharge Medication List as of 01/10/2015 11:42 AM      The patient appears reasonably screen and/or stabilized for discharge  and I doubt any other medical condition or other [facility] requiring further screening, evaluation, or treatment in the ED at this time prior to discharge.      Melene Plan, DO 01/10/15 1232

## 2015-01-10 NOTE — ED Notes (Signed)
Pt c/o bilateral leg/arm/foot pain. Reports chronic issues with same for several years. Son said she was crying when she woke up this morning due to pain, concerned that she won't be able to move and walk due to pain, takes care of her at home. 8/10 pain upon arrival to ED.

## 2015-01-10 NOTE — Discharge Instructions (Signed)

## 2015-01-10 NOTE — ED Notes (Signed)
Pt transported to xray 

## 2015-01-12 ENCOUNTER — Encounter: Payer: Self-pay | Admitting: Cardiology

## 2015-01-13 LAB — CUP PACEART REMOTE DEVICE CHECK
Brady Statistic RA Percent Paced: 25 %
Brady Statistic RV Percent Paced: 0 %
Date Time Interrogation Session: 20161014160912
Implantable Lead Implant Date: 20131028
Lead Channel Impedance Value: 410 Ohm
Lead Channel Impedance Value: 546 Ohm
Lead Channel Pacing Threshold Pulse Width: 0.4 ms
Lead Channel Setting Pacing Amplitude: 1.5 V
Lead Channel Setting Pacing Pulse Width: 0.4 ms
MDC IDC LEAD IMPLANT DT: 20131028
MDC IDC LEAD LOCATION: 753859
MDC IDC LEAD LOCATION: 753860
MDC IDC MSMT LEADCHNL RA PACING THRESHOLD AMPLITUDE: 0.6 V
MDC IDC MSMT LEADCHNL RA SENSING INTR AMPL: 2.2 mV
MDC IDC MSMT LEADCHNL RV SENSING INTR AMPL: 14.7 mV
MDC IDC PG SERIAL: 66383162
MDC IDC SET LEADCHNL RV PACING AMPLITUDE: 2.5 V
MDC IDC SET LEADCHNL RV SENSING SENSITIVITY: 2.5 mV

## 2015-01-19 ENCOUNTER — Encounter: Payer: Self-pay | Admitting: Internal Medicine

## 2015-03-01 ENCOUNTER — Telehealth: Payer: Self-pay

## 2015-03-01 ENCOUNTER — Ambulatory Visit (INDEPENDENT_AMBULATORY_CARE_PROVIDER_SITE_OTHER): Payer: Medicare Other | Admitting: Internal Medicine

## 2015-03-01 ENCOUNTER — Encounter: Payer: Self-pay | Admitting: Internal Medicine

## 2015-03-01 ENCOUNTER — Telehealth: Payer: Self-pay | Admitting: Internal Medicine

## 2015-03-01 VITALS — BP 120/68 | HR 103 | Temp 98.1°F | Resp 20

## 2015-03-01 DIAGNOSIS — R079 Chest pain, unspecified: Secondary | ICD-10-CM | POA: Diagnosis not present

## 2015-03-01 DIAGNOSIS — Z950 Presence of cardiac pacemaker: Secondary | ICD-10-CM

## 2015-03-01 DIAGNOSIS — M159 Polyosteoarthritis, unspecified: Secondary | ICD-10-CM

## 2015-03-01 DIAGNOSIS — K219 Gastro-esophageal reflux disease without esophagitis: Secondary | ICD-10-CM

## 2015-03-01 DIAGNOSIS — I480 Paroxysmal atrial fibrillation: Secondary | ICD-10-CM | POA: Diagnosis not present

## 2015-03-01 DIAGNOSIS — M150 Primary generalized (osteo)arthritis: Secondary | ICD-10-CM

## 2015-03-01 DIAGNOSIS — R609 Edema, unspecified: Secondary | ICD-10-CM

## 2015-03-01 DIAGNOSIS — M8949 Other hypertrophic osteoarthropathy, multiple sites: Secondary | ICD-10-CM

## 2015-03-01 DIAGNOSIS — I10 Essential (primary) hypertension: Secondary | ICD-10-CM

## 2015-03-01 MED ORDER — OMEPRAZOLE 40 MG PO CPDR: 40.0000 mg | DELAYED_RELEASE_CAPSULE | Freq: Every day | ORAL | Status: DC

## 2015-03-01 NOTE — Telephone Encounter (Signed)
Already addressed at OV

## 2015-03-01 NOTE — Telephone Encounter (Signed)
Ms. Tara Hughes's son checked her in and sat down beside his mother. Ms. Tara Hughes was here for a  3 month follow up. Tara RoundsSally the CMA came out to get the patient and the son told her that his mother said earlier that she was having chest pains. He never told me that information. I stated to the son that he should have told me she was having chest pains when he walked in to check the patient in. He did not reply back to my statement. The CMA also told him he should have let someone know way before he did.

## 2015-03-01 NOTE — Patient Instructions (Addendum)
Increase omeprazole to 2 caps daily (total 40mg ) until bottle empty then pick up new medicine  Continue other medications as ordered  Follow up with cardiology as scheduled  Follow up in 2 mos for routine visit.

## 2015-03-01 NOTE — Progress Notes (Signed)
Patient ID: Tara Hughes, female   DOB: 1925-04-29, 81 y.o.   MRN: [id number]    Location:    [facility]   Place of Service:   OFFICE  Chief Complaint  Patient presents with  . Medical Management of Chronic Issues    3 month follow-up for   . OTHER    Patient c/o came in office having chest pain son in room with patient  . Immunizations    Would like flu shot today    HPI:  81 yo female seen today for f/u. She c/o intermittent burning CP but no SOB, diaphoresis or palpitations. No associated HA, dizziness, N/V. She does not have NTG tabs. She sees cardio for PAF s/p pacer and is on eliquis. Last cardio appt in Oct 2016. Last ECG in June 2016  Edema - fluctuates. She takes lasix daily along Potassium supplement  BP stable lisinopril and lasix  GERD - stable on omeprazole daily. She has intermittent belching and epigastric pain. She takes Tums prn  Past Medical History  Diagnosis Date  . High blood pressure   . High cholesterol   . PERIPHERAL EDEMA 09/13/2009    Qualifier: Diagnosis of  By: Jonny Ruiz MD, Len Blalock   . Paroxysmal atrial fibrillation (HCC) 09/13/2014  . Pacemaker-Biotronik 09/13/2014    Past Surgical History  Procedure Laterality Date  . Abdominal hysterectomy    . Tonsillectomy    . Back operation    . Cholecystectomy      Patient Care Team: [name], DO as PCP - General (Internal Medicine)  Social History   Social History  . Marital Status: Widowed    Spouse Name: N/A  . Number of Children: N/A  . Years of Education: N/A   Occupational History  . Not on file.   Social History Main Topics  . Smoking status: Never Smoker   . Smokeless tobacco: Never Used  . Alcohol Use: No  . Drug Use: No  . Sexual Activity: Not on file   Other Topics Concern  . Not on file   Social History Narrative   Diet:      Do you drink/ eat things with caffeine? Coffee,soda      Marital status: Widow                              What year were you married ?      Do you live in a house, apartment,assistred living, condo, trailer, etc.)?Yes, House      Is it one or more stories? 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                              Type & how often:      Do you have a living will? No      Do you have a DNR form?  No                      If not, do you want to discuss one? No      Do you have signed POA?HPOA forms? Yes  If so, please bring to your        appointment           reports that she has never smoked. She has never used smokeless tobacco. She reports that she does not drink alcohol or use illicit drugs.  No Known Allergies  Medications: Patient's Medications  New Prescriptions   No medications on file  Previous Medications   ACETAMINOPHEN (TYLENOL) 325 MG TABLET    Take 650 mg by mouth every 6 (six) hours as needed for mild pain.   APIXABAN (ELIQUIS) 5 MG TABS TABLET    Take 1 tablet (5 mg total) by mouth 2 (two) times daily.   FUROSEMIDE (LASIX) 20 MG TABLET    Take 20 mg by mouth, alternating with 40 mg by mouth every other day.   LISINOPRIL (PRINIVIL,ZESTRIL) 5 MG TABLET    Take 1 tablet (5 mg total) by mouth daily.   POLYETHYLENE GLYCOL (MIRALAX / GLYCOLAX) PACKET    Take 17 g by mouth daily.   POTASSIUM CHLORIDE (K-DUR) 10 MEQ TABLET    Take 2 tablets (20 mEq total) by mouth 3 (three) times a week.  Modified Medications   Modified Medication Previous Medication   OMEPRAZOLE (PRILOSEC) 40 MG CAPSULE omeprazole (PRILOSEC) 20 MG capsule      Take 1 capsule (40 mg total) by mouth daily.    Take 1 capsule (20 mg total) by mouth daily.  Discontinued Medications   No medications on file    Review of Systems  Constitutional: Negative for fever, chills, diaphoresis, activity change, appetite change and fatigue.  HENT: Positive for drooling. Negative for ear pain  and sore throat.   Eyes: Negative for visual disturbance.  Respiratory: Negative for cough, chest tightness and shortness of breath.   Cardiovascular: Positive for chest pain. Negative for palpitations and leg swelling.  Gastrointestinal: Positive for abdominal pain. Negative for nausea, vomiting, diarrhea, constipation and blood in stool.  Genitourinary: Negative for dysuria.  Musculoskeletal: Positive for arthralgias.  Neurological: Negative for dizziness, tremors, numbness and headaches.  Hematological: Does not bruise/bleed easily.  Psychiatric/Behavioral: Negative for sleep disturbance. The patient is not nervous/anxious.     Filed Vitals:   03/01/15 1044  BP: 120/68  Pulse: 103  Temp: 98.1 F (36.7 C)  TempSrc: Oral  Resp: 20  SpO2: 97%   There is no weight on file to calculate BMI.  Physical Exam  Constitutional: She is oriented to person, place, and time. She appears well-developed and well-nourished. No distress.  Son present  HENT:  Mouth/Throat: Oropharynx is clear and moist. No oropharyngeal exudate.  Eyes: Pupils are equal, round, and reactive to light. No scleral icterus.  Neck: Carotid bruit is not present. No tracheal deviation present. No thyromegaly present.  Contraction flexure  Cardiovascular: Normal rate and intact distal pulses.  An irregularly irregular rhythm present. Exam reveals no gallop and no friction rub.   Murmur heard.  Systolic murmur is present with a grade of 1/6  +1 pitting LE edema b/l. No calf TTP  Pulmonary/Chest: Effort normal and breath sounds normal. No accessory muscle usage or stridor. No respiratory distress. She has no decreased breath sounds. She has no wheezes. She has no rhonchi. She has no rales. She exhibits tenderness. She exhibits no mass, no bony tenderness and no crepitus.    Abdominal: Soft. Bowel sounds are normal. She exhibits no distension and no mass. There is no hepatomegaly. There is tenderness (epigastric). There is  no rebound and  no guarding.  Musculoskeletal: She exhibits edema and tenderness.  Severe neck flexure  Lymphadenopathy:    She has no cervical adenopathy.  Neurological: She is alert and oriented to person, place, and time. She has normal reflexes.  Skin: Skin is warm and dry. No rash noted.  Psychiatric: She has a normal mood and affect. Her behavior is normal. Thought content normal.     Labs reviewed: Clinical Support on 12/13/2014  Component Date Value Ref Range Status  . Pulse Generator Manufacturer 12/13/2014 BITR   Preliminary  . Date Time Interrogation Session 12/13/2014 [id number]   Preliminary  . Pulse Gen Model 12/13/2014 Evia DR-T   Preliminary  . Pulse Gen Serial Number 12/13/2014 [id number]   Preliminary  . Implantable Pulse Generator Type 12/13/2014 Implantable Pulse Generator   Preliminary  . Implantable Pulse Generator Implan* 12/13/2014 [id number]   Preliminary  . Implantable Lead Manufacturer 12/13/2014 SJCR   Preliminary  . Implantable Lead Model 12/13/2014 Unknown   Preliminary  . Implantable Lead Serial Number 12/13/2014 unknown   Preliminary  . Implantable Lead Implant Date 12/13/2014 [id number]   Preliminary  . Implantable Lead Location 12/13/2014 [id number]   Preliminary  . Implantable Lead Manufacturer 12/13/2014 SJCR   Preliminary  . Implantable Lead Model 12/13/2014 Unknown   Preliminary  . Implantable Lead Serial Number 12/13/2014 unknown   Preliminary  . Implantable Lead Implant Date 12/13/2014 [id number]   Preliminary  . Implantable Lead Location 12/13/2014 [id number]   Preliminary  . Lead Channel Setting Sensing Sensi* 12/13/2014 2.5   Preliminary  . Lead Channel Setting Pacing Amplit* 12/13/2014 1.5   Preliminary  . Lead Channel Setting Pacing Pulse * 12/13/2014 0.4   Preliminary  . Lead Channel Setting Pacing Amplit* 12/13/2014 2.5   Preliminary  . Zone Setting Type Category 12/13/2014 BRADYCARDIA   Preliminary  . Lead Channel Impedance Value  12/13/2014 410   Preliminary  . Lead Channel Sensing Intrinsic Amp* 12/13/2014 2.2   Preliminary  . Lead Channel Pacing Threshold Ampl* 12/13/2014 0.6   Preliminary  . Lead Channel Pacing Threshold Puls* 12/13/2014 0.4   Preliminary  . Lead Channel Impedance Value 12/13/2014 546   Preliminary  . Lead Channel Sensing Intrinsic Amp* 12/13/2014 14.7   Preliminary  . Huston Foley Statistic RA Percent Paced 12/13/2014 25   Preliminary  . Huston Foley Statistic RV Percent Paced 12/13/2014 0   Preliminary  . Eval Rhythm 12/13/2014 AsVs   Preliminary    No results found.  ECG OBTAINED AND REVIEWED BY MYSELF:  afib with RVR @ 126 bpm, borderline nml axis, NSST changes. No pacer spikes seen. No acute ischemic changes. Other than rate, no significant changes since 6/16.  Assessment/Plan   ICD-9-CM ICD-10-CM   1. Chest pain, unspecified chest pain type probably related to #2 786.50 R07.9 EKG 12-Lead     Basic Metabolic Panel  2. Gastroesophageal reflux disease without esophagitis - uncontrolled 530.81 K21.9 omeprazole (PRILOSEC) 40 MG capsule  3. Primary osteoarthritis involving multiple joints - stable 715.09 M15.0   4. Essential hypertension, benign - stable 401.1 I10   5. Edema, unspecified type - stable 782.3 R60.9 Basic Metabolic Panel  6. Pacemaker - due to #7 V45.01 Z95.0   7. Paroxysmal atrial fibrillation (HCC)  427.31 I48.0 Basic Metabolic Panel   Increase omeprazole to 2 caps daily (total ) until bottle empty then pick up new medicine  Continue other medications as ordered  Follow up with cardiology as scheduled  Follow up in 2 mos for routine visit  Aoki Wedemeyer S. Perlie Gold  [facility] [address] [place], [place] [address] [phone] Cell (Monday-Friday 8 AM - 5 PM) [phone] After 5 PM and follow prompts

## 2015-03-01 NOTE — Telephone Encounter (Signed)
Patient arrived to office early for her appointment with her son, they sat in outer area for about forty or forty five minutes. When I went to bring the patient back for her appointment the son alerted me to the fact that his mother has been having chest pains all morning. And he wondered why shy had had to wait so long to be called back. He had not told the receptionist this information when he arrived, I told him that they were early and we didn't know about the chest pain but in the future if something like that is happening he should alert us right away.  Not to wait we can't help if we don't know.

## 2015-03-02 LAB — BASIC METABOLIC PANEL
BUN/Creatinine Ratio: 24 (ref 11–26)
BUN: 18 mg/dL (ref 8–27)
CO2: 24 mmol/L (ref 18–29)
CREATININE: 0.75 mg/dL (ref 0.57–1.00)
Calcium: 9.5 mg/dL (ref 8.7–10.3)
Chloride: 98 mmol/L (ref 97–106)
GFR calc Af Amer: 83 mL/min/{1.73_m2} (ref >59)
GFR, EST NON AFRICAN AMERICAN: 72 mL/min/{1.73_m2} (ref >59)
GLUCOSE: 119 mg/dL — AB (ref 65–99)
Potassium: 3.9 mmol/L (ref 3.5–5.2)
Sodium: 139 mmol/L (ref 136–144)

## 2015-03-14 ENCOUNTER — Ambulatory Visit (INDEPENDENT_AMBULATORY_CARE_PROVIDER_SITE_OTHER): Payer: Medicare Other | Admitting: *Deleted

## 2015-03-14 DIAGNOSIS — I480 Paroxysmal atrial fibrillation: Secondary | ICD-10-CM | POA: Diagnosis not present

## 2015-03-14 LAB — CUP PACEART REMOTE DEVICE CHECK
Brady Statistic RV Percent Paced: 0 %
Implantable Lead Implant Date: 20131028
Implantable Lead Implant Date: 20131028
Implantable Lead Location: 753859
Implantable Lead Serial Number: 12345
Lead Channel Impedance Value: 488 Ohm
Lead Channel Impedance Value: 585 Ohm
Lead Channel Sensing Intrinsic Amplitude: 2.4 mV
Lead Channel Setting Pacing Amplitude: 2.5 V
Lead Channel Setting Pacing Pulse Width: 0.4 ms
MDC IDC LEAD LOCATION: 753860
MDC IDC LEAD SERIAL: 12345
MDC IDC MSMT LEADCHNL RA PACING THRESHOLD AMPLITUDE: 0.6 V
MDC IDC MSMT LEADCHNL RA PACING THRESHOLD PULSEWIDTH: 0.4 ms
MDC IDC MSMT LEADCHNL RV SENSING INTR AMPL: 14.6 mV
MDC IDC SESS DTM: 20170609144622
MDC IDC SET LEADCHNL RA PACING AMPLITUDE: 1.5 V
MDC IDC SET LEADCHNL RV SENSING SENSITIVITY: 2.5 mV
MDC IDC STAT BRADY RA PERCENT PACED: 27 %
Pulse Gen Serial Number: 66383162

## 2015-03-14 NOTE — Progress Notes (Signed)
Remote pacemaker transmission.   

## 2015-03-22 ENCOUNTER — Encounter: Payer: Self-pay | Admitting: Cardiology

## 2015-04-17 ENCOUNTER — Inpatient Hospital Stay (HOSPITAL_COMMUNITY)
Admission: EM | Admit: 2015-04-17 | Discharge: 2015-04-22 | DRG: 308 | Disposition: A | Discharge status: Home / Self Care | Payer: Medicare Other | Attending: Internal Medicine | Admitting: Internal Medicine

## 2015-04-17 ENCOUNTER — Emergency Department (HOSPITAL_COMMUNITY): Payer: Medicare Other

## 2015-04-17 ENCOUNTER — Encounter (HOSPITAL_COMMUNITY): Payer: Self-pay | Admitting: Emergency Medicine

## 2015-04-17 DIAGNOSIS — Z23 Encounter for immunization: Secondary | ICD-10-CM

## 2015-04-17 DIAGNOSIS — I480 Paroxysmal atrial fibrillation: Secondary | ICD-10-CM | POA: Diagnosis not present

## 2015-04-17 DIAGNOSIS — Z79899 Other long term (current) drug therapy: Secondary | ICD-10-CM

## 2015-04-17 DIAGNOSIS — Z7901 Long term (current) use of anticoagulants: Secondary | ICD-10-CM

## 2015-04-17 DIAGNOSIS — R531 Weakness: Secondary | ICD-10-CM | POA: Diagnosis present

## 2015-04-17 DIAGNOSIS — Z6834 Body mass index (BMI) 34.0-34.9, adult: Secondary | ICD-10-CM

## 2015-04-17 DIAGNOSIS — I5033 Acute on chronic diastolic (congestive) heart failure: Secondary | ICD-10-CM | POA: Diagnosis present

## 2015-04-17 DIAGNOSIS — R079 Chest pain, unspecified: Secondary | ICD-10-CM | POA: Diagnosis not present

## 2015-04-17 DIAGNOSIS — I110 Hypertensive heart disease with heart failure: Secondary | ICD-10-CM | POA: Diagnosis present

## 2015-04-17 DIAGNOSIS — R5381 Other malaise: Secondary | ICD-10-CM | POA: Diagnosis present

## 2015-04-17 DIAGNOSIS — R601 Generalized edema: Secondary | ICD-10-CM

## 2015-04-17 DIAGNOSIS — R0789 Other chest pain: Secondary | ICD-10-CM | POA: Diagnosis present

## 2015-04-17 DIAGNOSIS — I4891 Unspecified atrial fibrillation: Secondary | ICD-10-CM | POA: Diagnosis present

## 2015-04-17 DIAGNOSIS — K219 Gastro-esophageal reflux disease without esophagitis: Secondary | ICD-10-CM | POA: Diagnosis present

## 2015-04-17 DIAGNOSIS — I10 Essential (primary) hypertension: Secondary | ICD-10-CM | POA: Diagnosis present

## 2015-04-17 DIAGNOSIS — E7800 Pure hypercholesterolemia, unspecified: Secondary | ICD-10-CM | POA: Diagnosis present

## 2015-04-17 DIAGNOSIS — Z950 Presence of cardiac pacemaker: Secondary | ICD-10-CM

## 2015-04-17 HISTORY — DX: Gastro-esophageal reflux disease without esophagitis: K21.9

## 2015-04-17 HISTORY — DX: Chronic diastolic (congestive) heart failure: I50.32

## 2015-04-17 HISTORY — DX: Unspecified osteoarthritis, unspecified site: M19.90

## 2015-04-17 HISTORY — DX: Unspecified dementia, unspecified severity, without behavioral disturbance, psychotic disturbance, mood disturbance, and anxiety: F03.90

## 2015-04-17 LAB — BASIC METABOLIC PANEL
Anion gap: 11 (ref 5–15)
BUN: 12 mg/dL (ref 6–20)
CHLORIDE: 107 mmol/L (ref 101–111)
CO2: 24 mmol/L (ref 22–32)
Calcium: 9.1 mg/dL (ref 8.9–10.3)
Creatinine, Ser: 0.75 mg/dL (ref 0.44–1.00)
GFR calc Af Amer: >60 mL/min (ref >60)
GFR calc non Af Amer: >60 mL/min (ref >60)
Glucose, Bld: 120 mg/dL — ABNORMAL HIGH (ref 65–99)
POTASSIUM: 4.1 mmol/L (ref 3.5–5.1)
SODIUM: 142 mmol/L (ref 135–145)

## 2015-04-17 LAB — CBC
HEMATOCRIT: 36.7 % (ref 36.0–46.0)
Hemoglobin: 12 g/dL (ref 12.0–15.0)
MCH: 30 pg (ref 26.0–34.0)
MCHC: 32.7 g/dL (ref 30.0–36.0)
MCV: 91.8 fL (ref 78.0–100.0)
Platelets: 286 10*3/uL (ref 150–400)
RBC: 4 MIL/uL (ref 3.87–5.11)
RDW: 14.9 % (ref 11.5–15.5)
WBC: 8.7 10*3/uL (ref 4.0–10.5)

## 2015-04-17 LAB — BRAIN NATRIURETIC PEPTIDE: B Natriuretic Peptide: 40.9 pg/mL (ref 0.0–100.0)

## 2015-04-17 LAB — TROPONIN I: Troponin I: <0.03 ng/mL (ref <0.031)

## 2015-04-17 NOTE — ED Provider Notes (Signed)
CSN: [id number]     Arrival date & time 04/17/15  2058 History   First MD Initiated Contact with Patient 04/17/15 2118     Chief Complaint  Patient presents with  . Chest Pain  . Atrial Fibrillation      Patient is a 82 y.o. female presenting with chest pain and atrial fibrillation. The history is provided by the patient.  Chest Pain Associated symptoms: shortness of breath   Associated symptoms: no abdominal pain, no back pain and no numbness   Atrial Fibrillation Associated symptoms include chest pain and shortness of breath. Pertinent negatives include no abdominal pain.   patient presents with chest pain. Mid chest going to the right side. She's had occasionally but worse today. States it comes and goes last couple hours a time. Not associated with exertion. She does have swelling in legs but states is improved somewhat. No fevers. No cough. No nausea or vomiting. History of atrial fibrillation. She also has a pacemaker  Past Medical History  Diagnosis Date  . High blood pressure   . High cholesterol   . PERIPHERAL EDEMA 09/13/2009    Qualifier: Diagnosis of  By: Jonny RuizJohn MD, Len BlalockJames W   . Paroxysmal atrial fibrillation (HCC) 09/13/2014  . Pacemaker-Biotronik 09/13/2014   Past Surgical History  Procedure Laterality Date  . Abdominal hysterectomy    . Tonsillectomy    . Back operation    . Cholecystectomy     Family History  Problem Relation Age of Onset  . Cancer Father   . Diabetes Brother   . Diverticulitis Sister   . Heart disease Sister    Social History  Substance Use Topics  . Smoking status: Never Smoker   . Smokeless tobacco: Never Used  . Alcohol Use: No   OB History    No data available     Review of Systems  Constitutional: Negative for appetite change.  HENT: Negative for facial swelling.   Respiratory: Positive for shortness of breath.   Cardiovascular: Positive for chest pain and leg swelling.  Gastrointestinal: Negative for abdominal pain.   Genitourinary: Negative for flank pain.  Musculoskeletal: Negative for back pain.  Skin: Negative for pallor.  Neurological: Negative for numbness.      Allergies  Review of patient's allergies indicates no known allergies.  Home Medications   Prior to Admission medications   Medication Sig Start Date End Date Taking? Authorizing Provider  acetaminophen (TYLENOL) 325 MG tablet Take 650 mg by mouth every 6 (six) hours as needed for mild pain.   Yes Historical Provider, MD  apixaban (ELIQUIS) 5 MG TABS tablet Take 1 tablet (5 mg total) by mouth 2 (two) times daily. 09/29/14  Yes Pricilla RifflePaula Ross V, MD  furosemide (LASIX) 20 MG tablet Take 20-40 mg by mouth daily. Take 20 mg twice daily on Mon / Wed / Fri   Yes Historical Provider, MD  lisinopril (PRINIVIL,ZESTRIL) 5 MG tablet Take 1 tablet (5 mg total) by mouth daily. 09/29/14  Yes Pricilla RifflePaula Ross V, MD  omeprazole (PRILOSEC) 40 MG capsule Take 1 capsule (40 mg total) by mouth daily. 03/01/15  Yes Kirt BoysMonica Carter, DO  potassium chloride (K-DUR) 10 MEQ tablet Take 2 tablets (20 mEq total) by mouth 3 (three) times a week. 10/14/14  Yes Pricilla RifflePaula Ross V, MD   BP 106/59 mmHg  Pulse 102  Temp(Src) 98.1 F (36.7 C) (Oral)  Resp 15  Ht 4\' 11"  (1.499 m)  Wt 173 lb (78.472 kg)  BMI 34.92 kg/m2  SpO2 100% Physical Exam  Constitutional: She appears well-developed.  HENT:  Head: Atraumatic.  Eyes: EOM are normal.  Neck:  Kyphosis of her neck  Cardiovascular:  Irregular tachycardia  Pulmonary/Chest: Effort normal.  Abdominal: Soft. There is no tenderness.  Musculoskeletal: Normal range of motion. She exhibits edema.  Pitting edema to bilateral lower legs.  Neurological: She is alert.  Skin: Skin is warm.    ED Course  Procedures (including critical care time) Labs Review Labs Reviewed  BASIC METABOLIC PANEL - Abnormal; Notable for the following:    Glucose, Bld 120 (*)    All other components within normal limits  CBC  TROPONIN I  BRAIN  NATRIURETIC PEPTIDE    Imaging Review Dg Chest 2 View  04/17/2015  CLINICAL DATA:  Acute onset of central chest pain. Initial encounter. EXAM: CHEST  2 VIEW COMPARISON:  Chest radiograph performed 12/09/2008, and CT of the chest performed 01/26/2009 FINDINGS: Evaluation is somewhat suboptimal due to limitations in positioning. The patient's head obscures the lung apices. Mild vascular congestion is noted. Mild bilateral atelectasis is seen. No definite pleural effusion or pneumothorax is identified. The heart is mildly enlarged. A pacemaker is noted at the left chest wall, with leads ending at the right atrium and right ventricle. No acute osseous abnormalities are identified. IMPRESSION: Mild vascular congestion and mild cardiomegaly noted. Mild bilateral atelectasis seen. Electronically Signed   By: Roanna Raider M.D.   On: 04/17/2015 21:55   I have personally reviewed and evaluated these images and lab results as part of my medical decision-making.   EKG Interpretation   Date/Time:  Monday April 17 2015 21:12:38 EST Ventricular Rate:  127 PR Interval:    QRS Duration: 127 QT Interval:  310 QTC Calculation: 451 R Axis:   50 Text Interpretation:  Atrial fibrillation Baseline wander in lead(s) II  III aVF Confirmed by Rubin Payor  MD, Harrold Donath [phone]) on 04/17/2015 9:29:54 PM      MDM   Final diagnoses:  Atrial fibrillation with RVR (HCC)    Patient is felt bad for last 2 days. Found to be in atrial fibrillation with RVR. History of A. fib and is on anticoagulation. Has pacemaker also. Will admit to internal medicine.    Benjiman Core, MD 04/18/15 0020

## 2015-04-17 NOTE — ED Notes (Signed)
Per EMS:  Pt here from home where she lives with her son.  Pt began c/o chest pain this afternoon.  Unable to determine time of start.  Patient states she thinks she woke up with it.  Pt c/o right sided achy chest pain.  Pt was given ASA and 3 Nitros en route.  Pt has a pacemaker.  Pt has distended abdomen, pitting edema bilaterally in her legs, and she tucks her chin normally at home for comfort.

## 2015-04-18 ENCOUNTER — Encounter (HOSPITAL_COMMUNITY): Payer: Self-pay | Admitting: General Practice

## 2015-04-18 DIAGNOSIS — I4891 Unspecified atrial fibrillation: Secondary | ICD-10-CM | POA: Diagnosis present

## 2015-04-18 DIAGNOSIS — I10 Essential (primary) hypertension: Secondary | ICD-10-CM | POA: Diagnosis not present

## 2015-04-18 DIAGNOSIS — R531 Weakness: Secondary | ICD-10-CM | POA: Diagnosis present

## 2015-04-18 DIAGNOSIS — R079 Chest pain, unspecified: Secondary | ICD-10-CM | POA: Diagnosis present

## 2015-04-18 DIAGNOSIS — Z6834 Body mass index (BMI) 34.0-34.9, adult: Secondary | ICD-10-CM | POA: Diagnosis not present

## 2015-04-18 DIAGNOSIS — R5381 Other malaise: Secondary | ICD-10-CM

## 2015-04-18 DIAGNOSIS — I110 Hypertensive heart disease with heart failure: Secondary | ICD-10-CM | POA: Diagnosis present

## 2015-04-18 DIAGNOSIS — I5033 Acute on chronic diastolic (congestive) heart failure: Secondary | ICD-10-CM | POA: Diagnosis present

## 2015-04-18 DIAGNOSIS — R601 Generalized edema: Secondary | ICD-10-CM

## 2015-04-18 DIAGNOSIS — Z23 Encounter for immunization: Secondary | ICD-10-CM | POA: Diagnosis not present

## 2015-04-18 DIAGNOSIS — I480 Paroxysmal atrial fibrillation: Secondary | ICD-10-CM | POA: Diagnosis present

## 2015-04-18 DIAGNOSIS — E7800 Pure hypercholesterolemia, unspecified: Secondary | ICD-10-CM | POA: Diagnosis present

## 2015-04-18 DIAGNOSIS — K219 Gastro-esophageal reflux disease without esophagitis: Secondary | ICD-10-CM | POA: Diagnosis present

## 2015-04-18 DIAGNOSIS — Z7901 Long term (current) use of anticoagulants: Secondary | ICD-10-CM | POA: Diagnosis not present

## 2015-04-18 DIAGNOSIS — Z950 Presence of cardiac pacemaker: Secondary | ICD-10-CM | POA: Diagnosis not present

## 2015-04-18 DIAGNOSIS — R0789 Other chest pain: Secondary | ICD-10-CM

## 2015-04-18 DIAGNOSIS — Z79899 Other long term (current) drug therapy: Secondary | ICD-10-CM | POA: Diagnosis not present

## 2015-04-18 LAB — URINALYSIS, ROUTINE W REFLEX MICROSCOPIC
Bilirubin Urine: NEGATIVE
GLUCOSE, UA: NEGATIVE mg/dL
KETONES UR: NEGATIVE mg/dL
Nitrite: NEGATIVE
PROTEIN: NEGATIVE mg/dL
Specific Gravity, Urine: 1.017 (ref 1.005–1.030)
pH: 5.5 (ref 5.0–8.0)

## 2015-04-18 LAB — CBC
HEMATOCRIT: 37.6 % (ref 36.0–46.0)
Hemoglobin: 12.4 g/dL (ref 12.0–15.0)
MCH: 30.4 pg (ref 26.0–34.0)
MCHC: 33 g/dL (ref 30.0–36.0)
MCV: 92.2 fL (ref 78.0–100.0)
Platelets: 276 10*3/uL (ref 150–400)
RBC: 4.08 MIL/uL (ref 3.87–5.11)
RDW: 14.7 % (ref 11.5–15.5)
WBC: 6.7 10*3/uL (ref 4.0–10.5)

## 2015-04-18 LAB — BASIC METABOLIC PANEL
Anion gap: 8 (ref 5–15)
BUN: 12 mg/dL (ref 6–20)
CO2: 27 mmol/L (ref 22–32)
Calcium: 9 mg/dL (ref 8.9–10.3)
Chloride: 107 mmol/L (ref 101–111)
Creatinine, Ser: 0.77 mg/dL (ref 0.44–1.00)
GFR calc Af Amer: >60 mL/min (ref >60)
GLUCOSE: 104 mg/dL — AB (ref 65–99)
POTASSIUM: 4.1 mmol/L (ref 3.5–5.1)
Sodium: 142 mmol/L (ref 135–145)

## 2015-04-18 LAB — URINE MICROSCOPIC-ADD ON: Bacteria, UA: NONE SEEN

## 2015-04-18 LAB — TROPONIN I

## 2015-04-18 MED ORDER — ONDANSETRON HCL 4 MG/2ML IJ SOLN: 4.0000 mg | Freq: Four times a day (QID) | INTRAMUSCULAR | Status: DC | PRN

## 2015-04-18 MED ORDER — INFLUENZA VAC SPLIT QUAD 0.5 ML IM SUSY
0.5000 mL | PREFILLED_SYRINGE | INTRAMUSCULAR | Status: AC
  Administered 2015-04-19: 0.5 mL via INTRAMUSCULAR
  Filled 2015-04-18: qty 0.5

## 2015-04-18 MED ORDER — SODIUM CHLORIDE 0.9 % IV SOLN: 250.0000 mL | INTRAVENOUS | Status: DC | PRN

## 2015-04-18 MED ORDER — ACETAMINOPHEN 325 MG PO TABS: 650.0000 mg | ORAL_TABLET | ORAL | Status: DC | PRN

## 2015-04-18 MED ORDER — PANTOPRAZOLE SODIUM 40 MG PO TBEC
40.0000 mg | DELAYED_RELEASE_TABLET | Freq: Every day | ORAL | Status: DC
  Administered 2015-04-18 – 2015-04-22 (×5): 40 mg via ORAL
  Filled 2015-04-18 (×5): qty 1

## 2015-04-18 MED ORDER — FUROSEMIDE 10 MG/ML IJ SOLN
40.0000 mg | Freq: Two times a day (BID) | INTRAMUSCULAR | Status: DC
  Administered 2015-04-18 – 2015-04-19 (×2): 40 mg via INTRAVENOUS
  Filled 2015-04-18 (×3): qty 4

## 2015-04-18 MED ORDER — SODIUM CHLORIDE 0.9 % IJ SOLN
3.0000 mL | Freq: Two times a day (BID) | INTRAMUSCULAR | Status: DC
  Administered 2015-04-18 – 2015-04-22 (×10): 3 mL via INTRAVENOUS

## 2015-04-18 MED ORDER — FUROSEMIDE 10 MG/ML IJ SOLN
20.0000 mg | Freq: Two times a day (BID) | INTRAMUSCULAR | Status: DC
Start: 2015-04-18 — End: 2015-04-18
  Administered 2015-04-18: 20 mg via INTRAVENOUS
  Filled 2015-04-18: qty 2

## 2015-04-18 MED ORDER — APIXABAN 5 MG PO TABS
5.0000 mg | ORAL_TABLET | Freq: Two times a day (BID) | ORAL | Status: DC
  Administered 2015-04-18 – 2015-04-22 (×10): 5 mg via ORAL
  Filled 2015-04-18 (×10): qty 1

## 2015-04-18 MED ORDER — DILTIAZEM HCL 30 MG PO TABS
30.0000 mg | ORAL_TABLET | Freq: Four times a day (QID) | ORAL | Status: DC
  Administered 2015-04-18 – 2015-04-19 (×6): 30 mg via ORAL
  Filled 2015-04-18 (×6): qty 1

## 2015-04-18 MED ORDER — LISINOPRIL 5 MG PO TABS
5.0000 mg | ORAL_TABLET | Freq: Every day | ORAL | Status: DC
  Administered 2015-04-18: 5 mg via ORAL
  Filled 2015-04-18: qty 1

## 2015-04-18 MED ORDER — ACETAMINOPHEN 325 MG PO TABS: 650.0000 mg | ORAL_TABLET | Freq: Four times a day (QID) | ORAL | Status: DC | PRN

## 2015-04-18 MED ORDER — SODIUM CHLORIDE 0.9 % IJ SOLN: 3.0000 mL | INTRAMUSCULAR | Status: DC | PRN

## 2015-04-18 NOTE — Plan of Care (Signed)
Problem: Education: Goal: Knowledge of disease or condition will improve Outcome: Progressing Patient currently receiving oral Cardizem.  RN provided education pertaining to medication.

## 2015-04-18 NOTE — Plan of Care (Addendum)
Problem: Cardiac: Goal: Ability to achieve and maintain adequate cardiopulmonary perfusion will improve Outcome: Progressing Patient rhythm currently atrial paced.  Cardizem and Eliquis administered per MD orders.

## 2015-04-18 NOTE — Progress Notes (Signed)
Patient seen and examined. Admitted after midnight secondary to chest pain and palpations. Found to be in atrial fibrillation with RVR. Patient is currently w/o CP and vital signs stable. No nausea, no vomiting. Still feeling her heart racing some intermittently. Please refer to H&P written by Dr. Arlean Hopping for further info/details of admission.  Plan: -cardiology consulted -will cycle troponin -daily weights, strict intake and output -low sodium heart healthy diet -will continue IV lasix -follow clinical response  Tara Hughes [phone]

## 2015-04-18 NOTE — Progress Notes (Signed)
Called and spoke with Dr. Gwenlyn Perking, Tara Hughes and patients son asking if Tara Hughes could use her OTC foot cream (Magnilife DB, Pain Relieving Foot Cream) on her feet to help relieve her itching on her feet. Dr. Gwenlyn Perking said it was ok to use. Updated Tara Hughes and her son.

## 2015-04-18 NOTE — Consult Note (Signed)
CARDIOLOGY CONSULT NOTE   Patient ID: Tara Hughes MRN: [id number], DOB/AGE: 1925-12-29   Admit date: 04/17/2015 Date of Consult: 04/18/2015 Reason for Consult: Atrial Fibrillation with RVR/ Chest Pain Physician Requesting Consult: Dr. Gwenlyn Perking   Primary Physician: [name], DO Primary Cardiologist: Dr. [name]  HPI: Tara Hughes is a 82 y.o. female with past medical history of PAF (on Eliquis), HTN, HLD, Tachy-brady syndrome (s/p PPM 08/2004) and GERD who presented to [facility] ED on 04/17/2015 for right sided-chest pain. Her initial EKG showed atrial fibrillation w/ RVR, HR of 127.  For this encounter, history is obtained from the patient and her chart. No family members are present during the encounter.   The patient reports she woke up with right-sided chest pain yesterday morning, describing it as a "soreness" which developed at rest and lasted throughout the day. She denies any shooting pain or radiating pain at that time.  Her initial CXR showed mild vascular congestion, mild cardiomegaly, and mild bilateral atelectasis. BNP normal at 40.9. Creatinine stable at 0.75.  She was admitted for further evaluation and started on Cardizem  QID. At 0600 this morning (04/18/2015) she converted to NSR on telemetry. Her rates had initially been in the 120's but following conversion, telemetry shows a normal atrial-paced rhythm in the 70's.  Troponin values have been negative.  At the time of this encounter, she denies any current chest pain, saying it has improved since yesterday. Says her breathing is at baseline. She does report having lower extremity edema but says this is a long-standing issue, which is confirmed by past Cardiology office notes. Denies any orthopnea or PND. She was given Lasix  IV this AM with only 90mL output today (unsure if documented fully for this shift). She was taking Lasix  daily and  BID on MWF prior to admission.   It appears she had a  recent episode of documented atrial fibrillation with RVR when seen by her PCP in 01/2015. Her last device interrogation was on 03/14/2015 and showed an atrial fibrillation burden of 2%.  She reports living with her son and ambulates using a walker. Denies any recent falls. Reports good compliance with her medications.    Problem List Past Medical History  Diagnosis Date  . High blood pressure   . High cholesterol   . PERIPHERAL EDEMA 09/13/2009    Qualifier: Diagnosis of  By: Jonny Ruiz MD, Len Blalock   . Paroxysmal atrial fibrillation (HCC) 09/13/2014  . Pacemaker-Biotronik 09/13/2014  . Dementia   . GERD (gastroesophageal reflux disease)   . Arthritis     Past Surgical History  Procedure Laterality Date  . Abdominal hysterectomy    . Tonsillectomy    . Back operation    . Cholecystectomy    . Insert / replace / remove pacemaker       Allergies No Known Allergies    Inpatient Medications . apixaban  5 mg Oral BID  . diltiazem  30 mg Oral 4 times per day  . furosemide  20 mg Intravenous Q12H  . [START ON 04/19/2015] Influenza vac split quadrivalent PF  0.5 mL Intramuscular Tomorrow-1000  . lisinopril  5 mg Oral Daily  . pantoprazole  40 mg Oral Daily  . sodium chloride  3 mL Intravenous Q12H    Family History Family History  Problem Relation Age of Onset  . Cancer Father   . Diabetes Brother   . Diverticulitis Sister   . Heart disease Sister  Social History Social History   Social History  . Marital Status: Widowed    Spouse Name: N/A  . Number of Children: N/A  . Years of Education: N/A   Occupational History  . Not on file.   Social History Main Topics  . Smoking status: Never Smoker   . Smokeless tobacco: Never Used  . Alcohol Use: No  . Drug Use: No  . Sexual Activity: Not on file   Other Topics Concern  . Not on file   Social History Narrative   Diet:      Do you drink/ eat things with caffeine? Coffee,soda      Marital status: Widow                               What year were you married ?      Do you live in a house, apartment,assistred living, condo, trailer, etc.)?Yes, House      Is it one or more stories? 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                              Type & how often:      Do you have a living will? No      Do you have a DNR form?  No                      If not, do you want to discuss one? No      Do you have signed POA?HPOA forms? Yes                If so, please bring to your        appointment           Review of Systems General:  No chills, fever, night sweats or weight changes.  Cardiovascular:  No dyspnea on exertion, edema, orthopnea, paroxysmal nocturnal dyspnea. Positive for chest pain and palpitations. Dermatological: No rash, lesions/masses Respiratory: No cough, Positive for dyspnea. Urologic: No hematuria, dysuria Abdominal:   No nausea, vomiting, diarrhea, bright red blood per rectum, melena, or hematemesis Neurologic:  No visual changes, wkns, changes in mental status. All other systems reviewed and are otherwise negative except as noted above.  Physical Exam  Blood pressure 116/59, pulse 71, temperature 97.7 F (36.5 C), temperature source Oral, resp. rate 17, height 4\' 11"  (1.499 m), weight 172 lb 8 oz (78.245 kg), SpO2 100 %.  General: Pleasant, elderly African American female appearing in NAD. Resting her chin on her chest.  Psych: Normal affect. Neuro: Alert and oriented X 3. Moves all extremities spontaneously. HEENT: Normal  Neck: Supple without bruits or JVD. Lungs:  Resp regular and unlabored, Minimal rales at bases bilaterally. No wheezing appreciated. Heart: RRR no s3, s4, or murmurs. Abdomen: Soft, non-tender, non-distended, BS + x 4.  Extremities: No clubbing or cyanosis. 1+ edema bilaterally, more prominent on left.  DP/PT/Radials 2+ and equal bilaterally.  Labs   Recent Labs  04/17/15 2200 04/18/15 0534  TROPONINI <0.03 <0.03   Lab Results  Component Value Date   WBC 6.7 04/18/2015  HGB 12.4 04/18/2015   HCT 37.6 04/18/2015   MCV 92.2 04/18/2015   PLT 276 04/18/2015    Recent Labs Lab 04/18/15 0534  NA 142  K 4.1  CL 107  CO2 27  BUN 12  CREATININE 0.77  CALCIUM 9.0  GLUCOSE 104*   Lab Results  Component Value Date   CHOL 174 11/23/2014   HDL 48 11/23/2014   LDLCALC 106* 11/23/2014   TRIG 100 11/23/2014    Radiology/Studies  Dg Chest 2 View: 04/17/2015  CLINICAL DATA:  Acute onset of central chest pain. Initial encounter. EXAM: CHEST  2 VIEW COMPARISON:  Chest radiograph performed 12/09/2008, and CT of the chest performed 01/26/2009 FINDINGS: Evaluation is somewhat suboptimal due to limitations in positioning. The patient's head obscures the lung apices. Mild vascular congestion is noted. Mild bilateral atelectasis is seen. No definite pleural effusion or pneumothorax is identified. The heart is mildly enlarged. A pacemaker is noted at the left chest wall, with leads ending at the right atrium and right ventricle. No acute osseous abnormalities are identified. IMPRESSION: Mild vascular congestion and mild cardiomegaly noted. Mild bilateral atelectasis seen. Electronically Signed   By: Roanna Raider M.D.   On: 04/17/2015 21:55    ECG: Atrial fibrillation w/ RVR, HR of 127   ECHOCARDIOGRAM: 09/13/2014 Study Conclusions - Left ventricle: The cavity size was normal. There was mild concentric hypertrophy. Systolic function was vigorous. The estimated ejection fraction was in the range of 65% to 70%. Wall motion was normal; there were no regional wall motion abnormalities. Doppler parameters are consistent with abnormal left ventricular relaxation (grade 1 diastolic dysfunction). There was no evidence of elevated ventricular filling pressure by Doppler  parameters. - Aortic valve: Trileaflet; normal thickness leaflets. There was no regurgitation. - Aortic root: The aortic root was normal in size. - Mitral valve: Structurally normal valve. There was trivial regurgitation. - Left atrium: The atrium was normal in size. - Right ventricle: The cavity size was normal. Wall thickness was normal. Systolic function was normal. - Right atrium: The atrium was normal in size. - Tricuspid valve: There was trivial regurgitation. - Pulmonic valve: There was trivial regurgitation. - Pulmonary arteries: Systolic pressure was within the normal range. - Inferior vena cava: The vessel was normal in size. - Pericardium, extracardiac: There was no pericardial effusion.  ASSESSMENT AND PLAN  1. Paroxysmal Atrial Fibrillation - presented with a "soreness" along her right-chest and feeling "fatigued" for the past 2 days.  - initial EKG showed atrial fibrillation with RVR, HR in the 120's. Around 0600 this morning, she converted to NSR on telemetry and has maintained a HR in the 70's. - This patients CHA2DS2-VASc Score and unadjusted Ischemic Stroke Rate (% per year) is equal to 7.2 % stroke rate/year from a score of 5 (CHF, HTN, Female, Age (2)). Continue Eliquis for anticoagulation. - started on short-acting Cardizem  QID. Would switch to long-acting Cardizem CD  daily once not receiving IV Lasix in an effort to avoid hypotension.    2. Chest Pain in setting of Atrial Fibrillation with RVR - reported constant R-sided chest pain throughout the day on 04/17/2015. Resolved this AM with return of NSR. - remains without chest pain at this time. - troponin values have been negative. - do not think further inpatient workup of her chest pain is indicated at this time.  3. Chronic Diastolic CHF - Echo in 08/2014 showed Grade 1 DD with preserved EF of 65-70%.  - CXR showed mild vascular  congestion, mild cardiomegaly, and mild bilateral  atelectasis. - BNP normal at 40.9. Has chronic lower extremity edema. - taking PO Lasix  daily and  BID on MWF prior to admission.  - Receiving small doses of IV Lasix  Q12H while admitted. Will increase to  IV for this afternoon's dose and tomorrow morning. Obtain strict I&O's.  4. Tachy-brady syndrome  - s/p PPM 08/2004 - device interrogation on 03/14/2015 showed an atrial fibrillation burden of 2%  5. HTN - BP has been 94/43 - 138/99 in the past 24 hours. - On Lisinopril  daily PTA. Will decrease to 2.5mg  daily to allow more BP support for addition of Cardizem. Hold tomorrow morning while receiving IV Lasix.   Signed, Ellsworth Lennox, PA-C 04/18/2015, 2:26 PM Pager: [phone]  Pt seen and examined  Agree with findings as noted by B Strader  I am familiar with pt from clnic Pt with afib and RVR on admit with chest pressure CXR with some venous congestion  Ches pressure most likely related to this  I am not convinced of active ischemia   On exam Lungs are CTA  Cardiac exam RRR  No signif murmurs  Ext with Tra edema  I would recomm switching to long acting cardiazem in AM  Back down on lisinopril in AM Diurese some with IV lasix.   Will continue to follow.  Dietrich Pates

## 2015-04-18 NOTE — ED Notes (Signed)
Admitting at bedside 

## 2015-04-18 NOTE — Plan of Care (Signed)
Problem: Education: Goal: Knowledge of [place] [facility] Education information/materials will improve Outcome: Progressing RN instructed patient to call and wait for assistance when needing to go to the bathroom.  Patient stated understanding and has called appropriately thus far this shift.  RN discussed plan of care with patient.  Medication education provided per RN on Cardizem.  Patient states understanding, medication education needed to be provided to patient's son whom patient lives with (patient lives with son per emergency department RN).

## 2015-04-18 NOTE — H&P (Addendum)
[facility] History and Physical  Tara Hughes ZOX:[id number] DOB: Aug 20, 1925 DOA: 04/17/2015  Referring physician: Dr Rubin Payor PCP: [name], DO   Chief Complaint: Chest pain  HPI: Tara Hughes is a 82 y.o. female with hx of HTN, HL, PAF and PPM presented to ED with reports of chest pain. Patient thinks she woke up with it.  R side, achy pain.  Rec'd ASA nd 3 SL NTG on the way here. Swollen legs and distended abd per RN"s notes in ED.  +SOB at home.  Has felt bad for about 2 days at home. Denies any cough, fever, purulent sputum, hemoptysis.  In ED has afib with RVR in the 110's- 130's.  Has known PAF is on Eliquis.  Also takes lasix 20 bid on MWF only, and lisinopril /d, KCL and PPI.   Son is here who takes care of her. She has a chronic neck issue so always rests her chin on her chest.  She walks w a walker at home. She has had leg swelling issues for " a long time" and he says that they have been "back and forth" trying to manage it.  Patient denies any orthopnea, PND, or hx of CHF.  Last echo was Jun 2016 and showed normal LV and RV function. Followed by Dr Dietrich Pates w cardiology. She has a hx of atypical CP off and on. She had a hx of syncope but none since placement of her PPM.  She was seen in cardiology office last summer for edema which was difficult to control. Rx with support hose was primary goal.   Home meds are apixaban, lasix, lisinopril, prilosec and KDur.     Chart review: Sept 2010 - chest pain, atypical.  R/O GERD. Morbid obesity, HTN, OA.  DC meds lopressor, ppi, ecasa prn SL NTG  ROS  denies abd pain, n/v/d  no dysuria  no hematuria  no joint pain  no HA  no blurry vision  Where does patient live home Can patient participate in ADLs? some  Past Medical History  Past Medical History  Diagnosis Date  . High blood pressure   . High cholesterol   . PERIPHERAL EDEMA 09/13/2009    Qualifier: Diagnosis of  By: Jonny Ruiz MD, Len Blalock   .  Paroxysmal atrial fibrillation (HCC) 09/13/2014  . Pacemaker-Biotronik 09/13/2014   Past Surgical History  Past Surgical History  Procedure Laterality Date  . Abdominal hysterectomy    . Tonsillectomy    . Back operation    . Cholecystectomy     Family History  Family History  Problem Relation Age of Onset  . Cancer Father   . Diabetes Brother   . Diverticulitis Sister   . Heart disease Sister     Social History  reports that she has never smoked. She has never used smokeless tobacco. She reports that she does not drink alcohol or use illicit drugs. Allergies No Known Allergies Home medications Prior to Admission medications   Medication Sig Start Date End Date Taking? Authorizing Provider  acetaminophen (TYLENOL) 325 MG tablet Take 650 mg by mouth every 6 (six) hours as needed for mild pain.   Yes Historical Provider, MD  apixaban (ELIQUIS) 5 MG TABS tablet Take 1 tablet (5 mg total) by mouth 2 (two) times daily. 09/29/14  Yes Pricilla Riffle, MD  furosemide (LASIX) 20 MG tablet Take 20-40 mg by mouth daily. Take 20 mg twice daily on Mon / Wed / Fri   Yes  Historical Provider, MD  lisinopril (PRINIVIL,ZESTRIL) 5 MG tablet Take 1 tablet (5 mg total) by mouth daily. 09/29/14  Yes Pricilla Riffle, MD  omeprazole (PRILOSEC) 40 MG capsule Take 1 capsule (40 mg total) by mouth daily. 03/01/15  Yes [name], DO  potassium chloride (K-DUR) 10 MEQ tablet Take 2 tablets (20 mEq total) by mouth 3 (three) times a week. 10/14/14  Yes Pricilla Riffle, MD   Liver Function Tests No results for input(s): AST, ALT, ALKPHOS, BILITOT, PROT, ALBUMIN in the last 168 hours. No results for input(s): LIPASE, AMYLASE in the last 168 hours. CBC  Recent Labs Lab 04/17/15 2150  WBC 8.7  HGB 12.0  HCT 36.7  MCV 91.8  PLT 286   Basic Metabolic Panel  Recent Labs Lab 04/17/15 2150  NA 142  K 4.1  CL 107  CO2 24  GLUCOSE 120*  BUN 12  CREATININE 0.75  CALCIUM 9.1     Filed Vitals:   04/17/15 2300  04/17/15 2315 04/17/15 2330 04/18/15 0000  BP:  130/89 138/99 106/59  Pulse:  102 113 102  Temp: 98.1 F (36.7 C)     TempSrc: Oral     Resp:  Height:  (1.499 m)     Weight: 78.472 kg (173 lb)     SpO2:  99% 99% 100%   Exam: Obese AAF no distress, responds appropriately  No rash, cyanosis or gangrene Sclera anicteric, throat clear No jvd Chest is clear bialt to the bases RRR no MRG noted Abd markedly obese, distended, prob some ascites, +bs GU deferred MS no joint effusion Ext diffuse 2-3+ edema of both LE's from legs to the hips No wounds or ulcers Neuro gen weakness LE> UE, symmetric, responds to voice and makes appropriate resopnses  Na 142 K 4.1  Creat 0.75  Trop <0.03  BNP 41 WBC 8.7  Hb 12.0   Plt 286  EKG (independently reviewed) >  afib w RVR 127 bpm, no ST changes  CXR (independently reviewed) > clear w/o infiltrates or edema  Home medications > Eliquis 5 mg bid, lasix 20-40/ d, lisinopril 5/d, prilosec, KDur 20 tid   Assessment: 1 Afib w RVR - has hx of afib on Eliquis. Not real symptomatic. Plan lopressor 25 QID, call for cardiology consult in am. Admit to tele bed.  2 Bilat LE edema - a chronic issue it appears.  Not sure if this is diast LHF or new RHF but she has had issues with leg edema going back to early 2016 at least. Last echo in June had normal LV/ RV.  Markedly obese. No lab or physical stigmata of cirrhosis and no hx neph syndrome but will get UA.  STart IV lasix, diurese as tolerated.  3 Morbid obesity 4 HTN on acei, lasix 5 PPM 6 Chest pain - hx atypical CP; neg EKG and first troponin   Plan - admit, po dilt 30 mg qid to start, IV lasix 20 bid, probably will need cardiology assistance. UA. Troponin.    DVT Prophylaxis none, on full anticoag  Code Status: full  Family Communication: in the room  Disposition Plan: home when better    Maree Krabbe [facility] Pager [phone]  Cell [phone]  If 7PM-7AM,  please contact night-coverage [email or web address] Password [email or web address] 04/18/2015, 12:59 AM

## 2015-04-18 NOTE — Plan of Care (Signed)
Problem: Education: Goal: Understanding of medication regimen will improve Outcome: Progressing RN discussed Cardizem and Eliquis medications with patient prior to administering.  RN explained to patient what each medication was being prescribed for and as of now administration schedule.  Patient is alert but not oriented x4, this information should be discussed with patient's son (whom patient lives with per emergency department RN).  Patient's son not present when RN administered medications.

## 2015-04-18 NOTE — Progress Notes (Signed)
UR Completed Allahna Husband Graves-Bigelow, RN,BSN [phone]  

## 2015-04-19 DIAGNOSIS — I480 Paroxysmal atrial fibrillation: Secondary | ICD-10-CM | POA: Diagnosis not present

## 2015-04-19 DIAGNOSIS — I10 Essential (primary) hypertension: Secondary | ICD-10-CM

## 2015-04-19 MED ORDER — FUROSEMIDE 20 MG PO TABS
20.0000 mg | ORAL_TABLET | ORAL | Status: DC
  Administered 2015-04-19 – 2015-04-21 (×3): 20 mg via ORAL
  Filled 2015-04-19 (×3): qty 1

## 2015-04-19 MED ORDER — DILTIAZEM HCL ER COATED BEADS 120 MG PO CP24
120.0000 mg | ORAL_CAPSULE | Freq: Every day | ORAL | Status: DC
  Administered 2015-04-19 – 2015-04-22 (×4): 120 mg via ORAL
  Filled 2015-04-19 (×4): qty 1

## 2015-04-19 MED ORDER — FUROSEMIDE 20 MG PO TABS
20.0000 mg | ORAL_TABLET | ORAL | Status: DC
  Administered 2015-04-20: 20 mg via ORAL
  Filled 2015-04-19: qty 1

## 2015-04-19 NOTE — Progress Notes (Addendum)
Patient Name: Tara Hughes Date of Encounter: 04/19/2015     Principal Problem:   Atrial fibrillation with RVR (HCC) Active Problems:   Chest pain, atypical   Essential hypertension, benign   Anasarca   Debility    SUBJECTIVE Patient is a 82 y.o. AA female with past medical history of PAF (on Eliquis), HTN, HLD, Tachy-brady syndrome (s/p PPM 08/2004) and GERD who presented to [facility] on 04/18/15 with chest pain and found to be in afib with RVR.  Today patient is cooperative and has no complaints. States she is feeling much better. Denies any CP, SOB, orthopnea, edema, palpitations.   CURRENT MEDS . apixaban  5 mg Oral BID  . diltiazem  30 mg Oral 4 times per day  . furosemide  40 mg Intravenous Q12H  . Influenza vac split quadrivalent PF  0.5 mL Intramuscular Tomorrow-1000  . pantoprazole  40 mg Oral Daily  . sodium chloride  3 mL Intravenous Q12H    OBJECTIVE  Filed Vitals:   04/18/15 1950 04/18/15 2110 04/19/15 0300 04/19/15 0509  BP:  104/46 122/59 136/70  Pulse:   72 80  Temp: 98.8 F (37.1 C)   98.5 F (36.9 C)  TempSrc: Oral     Resp:    17  Height:      Weight:    169 lb 14.4 oz (77.066 kg)  SpO2:    99%    Intake/Output Summary (Last 24 hours) at 04/19/15 0804 Last data filed at 04/18/15 2121  Gross per 24 hour  Intake    463 ml  Output     90 ml  Net    373 ml   Filed Weights   04/17/15 2300 04/18/15 0212 04/19/15 0509  Weight: 173 lb (78.472 kg) 172 lb 8 oz (78.245 kg) 169 lb 14.4 oz (77.066 kg)    PHYSICAL EXAM  General: Pleasant AA female in NAD Neuro: Alert and oriented. Moves all extremities spontaneously. Psych: Normal affect. HEENT:  Normal  Neck: Supple without bruits or JVD. Lungs:  Resp regular and unlabored. CTAB.  Heart: RRR no s3, s4, or murmurs. Abdomen: Soft, non-tender, non-distended, BS + x 4.  Extremities: No clubbing, cyanosis. Trace edema. DP/PT/Radials 2+ and equal bilaterally.  Accessory Clinical  Findings  CBC  Recent Labs  04/17/15 2150 04/18/15 0534  WBC 8.7 6.7  HGB 12.0 12.4  HCT 36.7 37.6  MCV 91.8 92.2  PLT 286 276   Basic Metabolic Panel  Recent Labs  04/17/15 2150 04/18/15 0534  NA 142 142  K 4.1 4.1  CL 107 107  CO2 24 27  GLUCOSE 120* 104*  BUN 12 12  CREATININE 0.75 0.77  CALCIUM 9.1 9.0   Cardiac Enzymes  Recent Labs  04/17/15 2200 04/18/15 0534  TROPONINI <0.03 <0.03    TELE  Paced atrial rhythm. Intermittent PVC's.  Radiology/Studies  Dg Chest 2 View  04/17/2015  CLINICAL DATA:  Acute onset of central chest pain. Initial encounter. EXAM: CHEST  2 VIEW COMPARISON:  Chest radiograph performed 12/09/2008, and CT of the chest performed 01/26/2009 FINDINGS: Evaluation is somewhat suboptimal due to limitations in positioning. The patient's head obscures the lung apices. Mild vascular congestion is noted. Mild bilateral atelectasis is seen. No definite pleural effusion or pneumothorax is identified. The heart is mildly enlarged. A pacemaker is noted at the left chest wall, with leads ending at the right atrium and right ventricle. No acute osseous abnormalities are identified. IMPRESSION: Mild vascular congestion and  mild cardiomegaly noted. Mild bilateral atelectasis seen. Electronically Signed   By: Roanna Raider M.D.   On: 04/17/2015 21:55    ASSESSMENT AND PLAN  1)Paroxymal Atrial Fibrillation with RVR: -HR controlled in the 70-80's currently. Tele shows paced atrial rhythm -BP has tolerated Cardizem  QID, will switch to long acting Cardizem  qd and monitor patient's BP -Continue Eliquis 5 mg po BID. CHADVASC 5 ( CHF 1, HTN 1, Age 9, F sex 1)  2) Acute on chronic Diastolic CHF: mild chf on CXR although BNP no elevated. She was started on IV lasix  BID - Wt down 4 lbs since admission 173-->169. I/Os postive but question accuracy - Continue daily weights - Strict I/O's - She appears euvolemic so we will discontinue IV lasix and  begin home regimen of  po daily and  BID on MWF  3) HTN - BP controlled at 136/70 - Home Lisinopril held on admission. Will continue to hold as she is normotensive and we receently started Cardizem  daily for rate control. This can be re added as an outpatient if BP elevated.  - Continue to monitor BP in hospital to ensure pt tolerates med change  4) Chest pain in setting of Atrial fibrillation with RVR: -resolved once HR was controlled, no further complaints of chest discomfort from patient  Signed, Benjiman Core PA-Student    Patient seen and examined  Agree with findings as noted above by B Wiseman Pt is alert this AM  Better than yesterday   Lungs are CTA  Cardiac RRR  No S3  Ext No edema Pt remains in SR    Volume statu is improved  Will keep one more day as transition to long acting cardiazem   Watch renal output Probable home tomorrow.   Dietrich Pates

## 2015-04-19 NOTE — Care Management Note (Addendum)
Case Management Note  Patient Details  Name: Tara Hughes MRN: [id number] Date of Birth: 05/19/1925  Subjective/Objective:  Pt admitted for Afib. Pt is from home with son.                  Action/Plan: CM did speak with pt and she uses a RW with ambulation. Pt may benefit from PT/OT consult. CM will continue to monitor for disposition needs.   Expected Discharge Date:                  Expected Discharge Plan:  Home w Home Health Services  In-House Referral:  NA  Discharge planning Services  CM Consult  Post Acute Care Choice:   Durable Medical Equipment, Home Health Choice offered to:   Patient,  Adult Children  DME Arranged:   Agricultural consultant DME Agency:   [facility]  HH Arranged:   Registered Nurse, Physical Therapy and Aide HH Agency:   [facility] Chartered certified accountant)  Status of Service:  Completed Medicare Important Message Given:    Date Medicare IM Given:    Medicare IM give by:    Date Additional Medicare IM Given:    Additional Medicare Important Message give by:     If discussed at Long Length of Stay Meetings, dates discussed:    Additional Comments:1102 04-21-15 Tomi Bamberger, RN, BSN [phone] CM did speak with son and pt in reference to [facility]. Son stated they had used [facility] previously. CM made referral for [facility] with Linus Orn [phone] with [facility] and DME Liaison Judeth Cornfield with [facility]. No further needs from CM at this time.   Gala Lewandowsky, RN 04/19/2015, 8:48 AM

## 2015-04-19 NOTE — Evaluation (Signed)
Physical Therapy Evaluation Patient Details Name: Tara Hughes MRN: [id number] DOB: Nov 25, 1925 Today's Date: 04/19/2015   History of Present Illness  pt is an 82 y/o female with h/o HTN, peripheral edema, PAF, and CHF, admitted with complaint of CP, some SOB and just feeling bad for 2 days.  Clinical Impression  Pt admitted with/for cp.  Pt currently limited functionally due to the problems listed below.  (see problems list.)  Pt will benefit from PT to maximize function and safety to be able to get home safely with available assist of family.     Follow Up Recommendations Home health PT;Supervision for mobility/OOB    Equipment Recommendations  None recommended by PT    Recommendations for Other Services       Precautions / Restrictions Precautions Precautions: Fall      Mobility  Bed Mobility Overal bed mobility: Needs Assistance Bed Mobility: Supine to Sit     Supine to sit: Max assist     General bed mobility comments: Initially, pt so stiff that coming sitting forward, coming OOB was difficult.  Pt also unable to get her arms back behind her to help scoot to EOB  Transfers Overall transfer level: Needs assistance Equipment used: Rolling walker (2 wheeled) Transfers: Sit to/from Stand Sit to Stand: Mod assist;+2 safety/equipment         General transfer comment: cued for safe hand placement, assisted forward and to power up into the RW.  Ambulation/Gait Ambulation/Gait assistance: Min assist;+2 safety/equipment Ambulation Distance (Feet): 40 Feet Assistive device: Rolling walker (2 wheeled) Gait Pattern/deviations: Step-through pattern;Shuffle     General Gait Details: Shuffled steps with body held stiffly with little truncal movement at all.  Stairs            Wheelchair Mobility    Modified Rankin (Stroke Patients Only)       Balance Overall balance assessment: Needs assistance Sitting-balance support: Bilateral upper extremity  supported;Single extremity supported;Feet supported Sitting balance-Leahy Scale: Poor Sitting balance - Comments: tending to lean posteriorly   Standing balance support: Bilateral upper extremity supported Standing balance-Leahy Scale: Poor Standing balance comment: reliant on RW and assist                             Pertinent Vitals/Pain Pain Assessment: Faces Faces Pain Scale: Hurts little more Pain Location: joint stiffness--knees Pain Descriptors / Indicators: Discomfort;Grimacing Pain Intervention(s): Monitored during session    Home Living Family/patient expects to be discharged to:: Private residence Living Arrangements: Children (3 adults) Available Help at Discharge: Family;Available 24 hours/day Type of Home: House Home Access: Ramped entrance;Stairs to enter Entrance Stairs-Rails: Right;Left Entrance Stairs-Number of Steps: 3 Home Layout: One level Home Equipment: Walker - 2 wheels;Bedside commode;Tub bench      Prior Function Level of Independence: Needs assistance   Gait / Transfers Assistance Needed: uses RW and someone follows or assists  ADL's / Homemaking Assistance Needed: assist for bathing, dressing        Hand Dominance        Extremity/Trunk Assessment   Upper Extremity Assessment: Generalized weakness           Lower Extremity Assessment: Generalized weakness (stiff with ROM limitations)      Cervical / Trunk Assessment: Kyphotic (neck held in significant flextion)  Communication   Communication: No difficulties  Cognition Arousal/Alertness: Awake/alert Behavior During Therapy: WFL for tasks assessed/performed Overall Cognitive Status: History of cognitive impairments - at baseline  General Comments      Exercises        Assessment/Plan    PT Assessment Patient needs continued PT services  PT Diagnosis Abnormality of gait;Generalized weakness   PT Problem List Decreased  strength;Decreased range of motion;Decreased activity tolerance;Decreased balance;Decreased mobility;Decreased knowledge of use of DME;Pain  PT Treatment Interventions DME instruction;Gait training;Functional mobility training;Therapeutic activities;Therapeutic exercise;Balance training;Patient/family education   PT Goals (Current goals can be found in the Care Plan section) Acute Rehab PT Goals Patient Stated Goal: back home PT Goal Formulation: With patient Time For Goal Achievement: 05/03/15 Potential to Achieve Goals: Good    Frequency Min 3X/week   Barriers to discharge        Co-evaluation               End of Session   Activity Tolerance: Patient limited by fatigue;Patient tolerated treatment well Patient left: in chair;with call bell/phone within reach;with family/visitor present Nurse Communication: Mobility status         Time: 1720-1745 PT Time Calculation (min) (ACUTE ONLY): 25 min   Charges:   PT Evaluation $PT Eval Moderate Complexity: 1 Procedure PT Treatments $Gait Training: 8-22 mins   PT G Codes:        Tara Hughes, Tara Hughes 04/19/2015, 5:59 PM  04/19/2015  Tara Hughes, PT [phone]  (pager)

## 2015-04-19 NOTE — Progress Notes (Signed)
[facility] PROGRESS NOTE  SERAH NICOLETTI ZOX:[id number] DOB: [birth year] DOA: 04/17/2015 PCP: [name], DO  Assessment/Plan: 1. Atypical chest pain: - resolved. ,. No sob.   2. Hypertension: Controlled.   3. Bilateral lower edema: Improving, on diuresis, on lasix po 20 mg BID.   4. MILD acute on chronic diastolic HF: - ON IV lasix, to po lasix this am.   5. PAF; rate control with cardizem 120 mg daily. Resume eliquis. .  Code Status: full code.  Family Communication: none at bedside.  Disposition Plan: pending.    Consultants:  cardiology  Procedures:  none Antibiotics:  none  HPI/Subjective: No chest pain, sob.   Objective: Filed Vitals:   04/19/15 1100 04/19/15 1520  BP: 128/44 110/48  Pulse:  66  Temp:  98.5 F (36.9 C)  Resp:  18    Intake/Output Summary (Last 24 hours) at 04/19/15 1707 Last data filed at 04/19/15 1300  Gross per 24 hour  Intake    543 ml  Output      0 ml  Net    543 ml   Filed Weights   04/17/15 2300 04/18/15 0212 04/19/15 0509  Weight: 78.472 kg (173 lb) 78.245 kg (172 lb 8 oz) 77.066 kg (169 lb 14.4 oz)    Exam:   General:  Alert afebrile comfortable.   Cardiovascular: s1s2  Respiratory: CLEAR NO wheezing or rhonchi  Abdomen: soft NT NDBS+  Musculoskeletal: pedal edema.  Data Reviewed: Basic Metabolic Panel:  Recent Labs Lab 04/17/15 2150 04/18/15 0534  NA 142 142  K 4.1 4.1  CL 107 107  CO2 24 27  GLUCOSE 120* 104*  BUN 12 12  CREATININE 0.75 0.77  CALCIUM 9.1 9.0   Liver Function Tests: No results for input(s): AST, ALT, ALKPHOS, BILITOT, PROT, ALBUMIN in the last 168 hours. No results for input(s): LIPASE, AMYLASE in the last 168 hours. No results for input(s): AMMONIA in the last 168 hours. CBC:  Recent Labs Lab 04/17/15 2150 04/18/15 0534  WBC 8.7 6.7  HGB 12.0 12.4  HCT 36.7 37.6  MCV 91.8 92.2  PLT 286 276   Cardiac Enzymes:  Recent Labs Lab 04/17/15 2200  04/18/15 0534  TROPONINI <0.03 <0.03   BNP (last 3 results)  Recent Labs  04/17/15 2150  BNP 40.9    ProBNP (last 3 results)  Recent Labs  09/01/14 1554 10/11/14 0928  PROBNP 30.0 14.0    CBG: No results for input(s): GLUCAP in the last 168 hours.  No results found for this or any previous visit (from the past 240 hour(s)).   Studies: Dg Chest 2 View  04/17/2015  CLINICAL DATA:  Acute onset of central chest pain. Initial encounter. EXAM: CHEST  2 VIEW COMPARISON:  Chest radiograph performed 12/09/2008, and CT of the chest performed 01/26/2009 FINDINGS: Evaluation is somewhat suboptimal due to limitations in positioning. The patient's head obscures the lung apices. Mild vascular congestion is noted. Mild bilateral atelectasis is seen. No definite pleural effusion or pneumothorax is identified. The heart is mildly enlarged. A pacemaker is noted at the left chest wall, with leads ending at the right atrium and right ventricle. No acute osseous abnormalities are identified. IMPRESSION: Mild vascular congestion and mild cardiomegaly noted. Mild bilateral atelectasis seen. Electronically Signed   By: Roanna Raider M.D.   On: 04/17/2015 21:55    Scheduled Meds: . apixaban  5 mg Oral BID  . diltiazem  120 mg Oral Daily  . furosemide  20 mg Oral 2 times per day on Mon Wed Fri  . [START ON 04/20/2015] furosemide  20 mg Oral Q T,Th,S,Su  . pantoprazole  40 mg Oral Daily  . sodium chloride  3 mL Intravenous Q12H   Continuous Infusions:   Principal Problem:   Atrial fibrillation with RVR (HCC) Active Problems:   Chest pain, atypical   Essential hypertension, benign   Anasarca   Debility    Time spent: 25 minutes.     [facility]  [facility] Pager [phone]. If 7PM-7AM, please contact night-coverage at [email or web address], password [facility] 04/19/2015, 5:07 PM  LOS: 1 day

## 2015-04-20 ENCOUNTER — Telehealth: Payer: Self-pay | Admitting: *Deleted

## 2015-04-20 LAB — BASIC METABOLIC PANEL
Anion gap: 7 (ref 5–15)
BUN: 18 mg/dL (ref 6–20)
CHLORIDE: 105 mmol/L (ref 101–111)
CO2: 27 mmol/L (ref 22–32)
CREATININE: 0.87 mg/dL (ref 0.44–1.00)
Calcium: 8.8 mg/dL — ABNORMAL LOW (ref 8.9–10.3)
GFR calc Af Amer: >60 mL/min (ref >60)
GFR calc non Af Amer: 58 mL/min — ABNORMAL LOW (ref >60)
Glucose, Bld: 120 mg/dL — ABNORMAL HIGH (ref 65–99)
POTASSIUM: 3.9 mmol/L (ref 3.5–5.1)
SODIUM: 139 mmol/L (ref 135–145)

## 2015-04-20 MED ORDER — POLYETHYLENE GLYCOL 3350 17 G PO PACK
17.0000 g | PACK | Freq: Every day | ORAL | Status: DC
  Administered 2015-04-20 – 2015-04-22 (×3): 17 g via ORAL
  Filled 2015-04-20 (×3): qty 1

## 2015-04-20 MED ORDER — AMIODARONE HCL 200 MG PO TABS
400.0000 mg | ORAL_TABLET | Freq: Two times a day (BID) | ORAL | Status: DC
  Administered 2015-04-20 – 2015-04-22 (×5): 400 mg via ORAL
  Filled 2015-04-20 (×5): qty 2

## 2015-04-20 NOTE — Progress Notes (Signed)
[facility] PROGRESS NOTE  Tara Hughes GNF:[id number] DOB: October 30, 1925 DOA: 04/17/2015 PCP: [name], DO Brief history: 82 year old  With past medical history of PAF , hypertension, hyperlipidemia, s/p PPM admitted for chest pain and was found to be in afib with RVR.  Assessment/Plan: 1. Atypical chest pain: - resolved. ,. No sob.   2. Hypertension: Controlled.  Holding lisinopril.   3. Bilateral lower edema: Improving, on diuresis, on lasix po 20 mg BID MWF and daily the rest of the days.   4. MILD acute on chronic diastolic HF: - ON IV lasix, to po lasix  ON 1/18.  - DAILY bmp   5. PAF; back in afib today.  rate control with cardizem 120 mg daily.  Rate up in 170's today, added amiodarone by cardiology.  Resume eliquis. .  Code Status: full code.  Family Communication: none at bedside.  Disposition Plan: pending control of HR, back home with home health PT when HR is better controlled.    Consultants:  cardiology  Procedures:  none Antibiotics:  none  HPI/Subjective: No chest pain, sob.   Objective: Filed Vitals:   04/20/15 0454 04/20/15 0855  BP: 100/51 101/43  Pulse: 81   Temp: 98.5 F (36.9 C)   Resp: 16     Intake/Output Summary (Last 24 hours) at 04/20/15 1127 Last data filed at 04/20/15 0856  Gross per 24 hour  Intake    490 ml  Output    150 ml  Net    340 ml   Filed Weights   04/18/15 0212 04/19/15 0509 04/20/15 0658  Weight: 78.245 kg (172 lb 8 oz) 77.066 kg (169 lb 14.4 oz) 76.386 kg (168 lb 6.4 oz)    Exam:   General:  Alert afebrile comfortable. Laying in the bed.   Cardiovascular: s1s2, irregularly irregular.   Respiratory: CLEAR NO wheezing or rhonchi  Abdomen: soft NT NDBS+  Musculoskeletal:no  pedal edema.  Data Reviewed: Basic Metabolic Panel:  Recent Labs Lab 04/17/15 2150 04/18/15 0534 04/20/15 0423  NA 142 142 139  K 4.1 4.1 3.9  CL 107 107 105  CO2 GLUCOSE 120* 104* 120*  BUN  CREATININE 0.75 0.77 0.87  CALCIUM 9.1 9.0 8.8*   Liver Function Tests: No results for input(s): AST, ALT, ALKPHOS, BILITOT, PROT, ALBUMIN in the last 168 hours. No results for input(s): LIPASE, AMYLASE in the last 168 hours. No results for input(s): AMMONIA in the last 168 hours. CBC:  Recent Labs Lab 04/17/15 2150 04/18/15 0534  WBC 8.7 6.7  HGB 12.0 12.4  HCT 36.7 37.6  MCV 91.8 92.2  PLT 286 276   Cardiac Enzymes:  Recent Labs Lab 04/17/15 2200 04/18/15 0534  TROPONINI <0.03 <0.03   BNP (last 3 results)  Recent Labs  04/17/15 2150  BNP 40.9    ProBNP (last 3 results)  Recent Labs  09/01/14 1554 10/11/14 0928  PROBNP 30.0 14.0    CBG: No results for input(s): GLUCAP in the last 168 hours.  No results found for this or any previous visit (from the past 240 hour(s)).   Studies: No results found.  Scheduled Meds: . amiodarone  400 mg Oral BID  . apixaban  5 mg Oral BID  . diltiazem  120 mg Oral Daily  . furosemide  20 mg Oral 2 times per day on Mon Wed Fri  . furosemide  20 mg Oral Q T,Th,S,Su  . pantoprazole  40 mg Oral Daily  . polyethylene glycol  17 g Oral Daily  . sodium chloride  3 mL Intravenous Q12H   Continuous Infusions:   Principal Problem:   Atrial fibrillation with RVR (HCC) Active Problems:   Chest pain, atypical   Essential hypertension, benign   Anasarca   Debility    Time spent: 25 minutes.     [facility]  [facility] Pager [phone]. If 7PM-7AM, please contact night-coverage at [email or web address], password [facility] 04/20/2015, 11:27 AM  LOS: 2 days

## 2015-04-20 NOTE — Telephone Encounter (Signed)
Patient son, Perlie Gold called and stated that his mother is in the Hospital right now with Irregular Heartbeat since Monday night.  She was having chest pains and they called EMS. Patient son would like to speak with you regarding patient. He is adamant with wanting to speak with you directly. Please call him at # [phone]

## 2015-04-20 NOTE — Evaluation (Signed)
Occupational Therapy Evaluation Patient Details Name: Tara Hughes MRN: [id number] DOB: 1925-07-26 Today's Date: 04/20/2015    History of Present Illness pt is an 82 y/o female with h/o HTN, peripheral edema, PAF, and CHF, admitted with complaint of CP, some SOB and just feeling bad for 2 days.   Clinical Impression   Pt with baseline dependence in self care with the exception of self feeding which she is able to perform when positioned optimally in her lift chair at home.  No acute OT needs.  Pt noted to have difficulty with swallowing when assisted for her lunch, RN notified, recommended ST for swallowing.    Follow Up Recommendations  No OT follow up;Supervision/Assistance - 24 hour    Equipment Recommendations  None recommended by OT    Recommendations for Other Services Speech consult (for swallowing)     Precautions / Restrictions Precautions Precautions: Fall      Mobility Bed Mobility               General bed mobility comments: pt in recliner  Transfers Overall transfer level: Needs assistance Equipment used: Rolling walker (2 wheeled) Transfers: Sit to/from [employer] Sit to Stand: Mod assist;+2 physical assistance (from recliner) Stand pivot transfers: Min assist;+2 safety/equipment       General transfer comment: assist from low surface of recliner to rise and shift weight anterior, pt is accustomed to a lift chair    Balance     Sitting balance-Leahy Scale: Poor Sitting balance - Comments: tending to lean posteriorly     Standing balance-Leahy Scale: Poor                              ADL Overall ADL's : At baseline                                       General ADL Comments: pt not able to self feed      Vision     Perception     Praxis      Pertinent Vitals/Pain Pain Assessment: Faces Faces Pain Scale: Hurts little more Pain Location: generalized Pain Descriptors / Indicators:  Grimacing;Discomfort Pain Intervention(s): Limited activity within patient's tolerance;Monitored during session;Repositioned     Hand Dominance Right   Extremity/Trunk Assessment Upper Extremity Assessment Upper Extremity Assessment: Generalized weakness (B shoulder arthritis with limited ROM, longstanding)   Lower Extremity Assessment Lower Extremity Assessment: Defer to PT evaluation   Cervical / Trunk Assessment Cervical / Trunk Assessment: Kyphotic (flexed cervical spine)   Communication Communication Communication: No difficulties   Cognition Arousal/Alertness: Awake/alert Behavior During Therapy: Flat affect Overall Cognitive Status: History of cognitive impairments - at baseline                     General Comments       Exercises       Shoulder Instructions      Home Living Family/patient expects to be discharged to:: Private residence Living Arrangements: Children Available Help at Discharge: Family;Available 24 hours/day Type of Home: House Home Access: Ramped entrance;Stairs to enter Entrance Stairs-Number of Steps: 3 Entrance Stairs-Rails: Right;Left Home Layout: One level     Bathroom Shower/Tub: Chief Strategy Officer: Standard Bathroom Accessibility: Yes How Accessible: Accessible via walker Home Equipment: Walker - 2 wheels;Bedside commode;Tub bench   Additional  Comments: pt sleeps in lift chair      Prior Functioning/Environment Level of Independence: Needs assistance  Gait / Transfers Assistance Needed: uses RW and someone follows or assists, assist to stand from elevated lift chair ADL's / Homemaking Assistance Needed: assist for bathing, dressing, toileting, son sets pt up to self feed with a tray in front of her chair and elevates it        OT Diagnosis: Generalized weakness;Cognitive deficits;Acute pain   OT Problem List:     OT Treatment/Interventions:      OT Goals(Current goals can be found in the care plan  section) Acute Rehab OT Goals Patient Stated Goal: back home  OT Frequency:     Barriers to D/C:            Co-evaluation              End of Session Equipment Utilized During Treatment: Rolling walker;Gait belt Nurse Communication:  (pt choking on lunch meal, requesting ST for dysphagia)  Activity Tolerance: Patient tolerated treatment well Patient left: in chair;with call bell/phone within reach;with family/visitor present   Time: 1204-1230 OT Time Calculation (min): 26 min Charges:  OT General Charges $OT Visit: 1 Procedure OT Evaluation $OT Eval Moderate Complexity: 1 Procedure OT Treatments $Self Care/Home Management : 8-22 mins G-Codes:    Evern Bio 04/20/2015, 1:11 PM [phone]

## 2015-04-20 NOTE — Plan of Care (Signed)
Problem: Safety: Goal: Ability to remain free from injury will improve Outcome: Progressing Pt is a 2 assist to stand. Patient cant move her arms well at all to grasp the front wheel walker. Once patient is up, patient is a contact guard assist to the commode. This last trip to the bathroom, patient states ,"I am getting pretty tired."  RN and Tech got the patient to the recliner. Educated family on patient safety, fall risk, and patients overall increased weakness. Patient to use bedside commode for patient and staff safety at this time. Will continue to educate and monitor.   Problem: Physical Regulation: Goal: Ability to maintain clinical measurements within normal limits will improve Outcome: Progressing Patient now in NSR at 78.   Problem: Cardiac: Goal: Ability to achieve and maintain adequate cardiopulmonary perfusion will improve Outcome: Progressing Patient HR was in the 120-160's this am. Cardizem and Amiodarone have been added. Patient in NSR at this time, even with exertion HR in 90's. Will continue to monitor.

## 2015-04-20 NOTE — Progress Notes (Addendum)
.   Patient Name: Tara Hughes Date of Encounter: 04/20/2015     Principal Problem:   Atrial fibrillation with RVR (HCC) Active Problems:   Chest pain, atypical   Essential hypertension, benign   Anasarca   Debility    SUBJECTIVE Patient is a 82 y.o. AA female with past medical history of PAF (on Eliquis), HTN, HLD, Tachy-brady syndrome (s/p PPM 08/2004) and GERD who presented to [facility] on 04/18/15 with chest pain and found to be in afib with RVR.  Today patient is cooperative and has no complaints. Still states that she feels good. Denies any CP, SOB, orthopnea, edema, palpitations.   CURRENT MEDS . apixaban  5 mg Oral BID  . diltiazem  120 mg Oral Daily  . furosemide  20 mg Oral 2 times per day on Mon Wed Fri  . furosemide  20 mg Oral Q T,Th,S,Su  . pantoprazole  40 mg Oral Daily  . sodium chloride  3 mL Intravenous Q12H    OBJECTIVE  Filed Vitals:   04/19/15 2039 04/19/15 2055 04/20/15 0454 04/20/15 0658  BP: 126/82  100/51   Pulse: 92  81   Temp:  98.1 F (36.7 C) 98.5 F (36.9 C)   TempSrc:  Oral Oral   Resp: 20  16   Height:      Weight:    168 lb 6.4 oz (76.386 kg)  SpO2: 99%  100%     Intake/Output Summary (Last 24 hours) at 04/20/15 0833 Last data filed at 04/19/15 2240  Gross per 24 hour  Intake    403 ml  Output    150 ml  Net    253 ml   Filed Weights   04/18/15 0212 04/19/15 0509 04/20/15 0658  Weight: 172 lb 8 oz (78.245 kg) 169 lb 14.4 oz (77.066 kg) 168 lb 6.4 oz (76.386 kg)    PHYSICAL EXAM  General: Pleasant AA female in NAD Neuro: Alert and oriented. Moves all extremities spontaneously. Psych: Normal affect. HEENT:  Normal  Neck: Supple without bruits or JVD. Lungs:  Resp regular and unlabored. Mild crackles noted b/l.  Heart: irregular rate, no s3, s4, or murmurs. Abdomen: Soft, non-tender, non-distended, BS + x 4.  Extremities: No clubbing, cyanosis. 1+ edema. DP/PT/Radials 2+ and equal bilaterally.  Accessory Clinical  Findings  CBC  Recent Labs  04/17/15 2150 04/18/15 0534  WBC 8.7 6.7  HGB 12.0 12.4  HCT 36.7 37.6  MCV 91.8 92.2  PLT 286 276   Basic Metabolic Panel  Recent Labs  04/18/15 0534 04/20/15 0423  NA 142 139  K 4.1 3.9  CL 107 105  CO2 27 27  GLUCOSE 104* 120*  BUN 12 18  CREATININE 0.77 0.87  CALCIUM 9.0 8.8*   Cardiac Enzymes  Recent Labs  04/17/15 2200 04/18/15 0534  TROPONINI <0.03 <0.03    TELE Paced over night, a fib with RVR this AM   Radiology/Studies  Dg Chest 2 View  04/17/2015  CLINICAL DATA:  Acute onset of central chest pain. Initial encounter. EXAM: CHEST  2 VIEW COMPARISON:  Chest radiograph performed 12/09/2008, and CT of the chest performed 01/26/2009 FINDINGS: Evaluation is somewhat suboptimal due to limitations in positioning. The patient's head obscures the lung apices. Mild vascular congestion is noted. Mild bilateral atelectasis is seen. No definite pleural effusion or pneumothorax is identified. The heart is mildly enlarged. A pacemaker is noted at the left chest wall, with leads ending at the right atrium and right  ventricle. No acute osseous abnormalities are identified. IMPRESSION: Mild vascular congestion and mild cardiomegaly noted. Mild bilateral atelectasis seen. Electronically Signed   By: Roanna Raider M.D.   On: 04/17/2015 21:55    ASSESSMENT AND PLAN  1)Paroxymal Atrial Fibrillation with RVR: -Tele shows that patient was controlled over night but went into afib with RVR this morning with resting HR in the 120-150s.  -Started long acting Cardizem  yesterday morning, BP range of 100-128/41-82, will consider adding amiodarone for rate control due to lower BP or adjusting cardizem to twice a day for better rate control.  -Continue Eliquis 5 mg po BID. CHADVASC 5 ( CHF 1, HTN 1, Age 30, F sex 1)  2) Acute on chronic Diastolic CHF: mild chf on CXR although BNP noT elevated. She was started on IV lasix  BID on admission and then  converted to home dose lasix yesterday (04/19/15) - Wt down 5 lbs since admission 173-->168. I/Os postive but question accuracy - Continue daily weights - Strict I/O's - Appears euvolemic, will continue home regimen of Lasix  po daily and  BID on MWF  3) HTN - BP controlled. Most recent reading this am was 101/41.  - Continue to hold home regimen of lisinopril due to make room AV nodal blocking agents in the setting of afib with RVR - Continue to monitor BP in hospital to ensure pt tolerates med change.  4) Chest pain in setting of Atrial fibrillation with RVR: -no further complaints of chest discomfort from patient  Signed, Benjiman Core PA-Student    Pt seen and examined  Appears tired  Lungs CTA  Cardiac  Irreg Irreg  Ext no edema   Back in afib  Agree with recomm by B Barnett Abu  Will add amiodarone 400 bid  COntinue Eliquis  Has PPM for back up rate  Continue eliquis  V Volume status is OK today .   Dietrich Pates

## 2015-04-21 LAB — BASIC METABOLIC PANEL
ANION GAP: 10 (ref 5–15)
BUN: 21 mg/dL — AB (ref 6–20)
CALCIUM: 9 mg/dL (ref 8.9–10.3)
CO2: 28 mmol/L (ref 22–32)
Chloride: 101 mmol/L (ref 101–111)
Creatinine, Ser: 0.92 mg/dL (ref 0.44–1.00)
GFR calc Af Amer: >60 mL/min (ref >60)
GFR, EST NON AFRICAN AMERICAN: 54 mL/min — AB (ref >60)
GLUCOSE: 113 mg/dL — AB (ref 65–99)
Potassium: 3.9 mmol/L (ref 3.5–5.1)
Sodium: 139 mmol/L (ref 135–145)

## 2015-04-21 MED ORDER — FUROSEMIDE 40 MG PO TABS: 40.0000 mg | ORAL_TABLET | ORAL | Status: DC

## 2015-04-21 MED ORDER — FUROSEMIDE 20 MG PO TABS
20.0000 mg | ORAL_TABLET | ORAL | Status: DC
  Administered 2015-04-22: 20 mg via ORAL
  Filled 2015-04-21: qty 1

## 2015-04-21 NOTE — Progress Notes (Signed)
Patient Name: Tara Hughes Date of Encounter: 04/21/2015  Principal Problem:   Atrial fibrillation with RVR (HCC) Active Problems:   Chest pain, atypical   Essential hypertension, benign   Anasarca   Debility    Primary Cardiologist: Dr. [name] Patient Profile: 82 y.o. AA female with past medical history of PAF (on Eliquis), HTN, HLD, Tachy-brady syndrome (s/p PPM 08/2004) and GERD who presented to [facility] on 04/18/15 with chest pain and found to be in afib with RVR.  SUBJECTIVE: Denies any chest pain or palpitations overnight. Reports her breathing is at baseline. Having shooting pain in her feet bilaterally.  OBJECTIVE Filed Vitals:   04/20/15 0855 04/20/15 1453 04/20/15 2025 04/21/15 0500  BP: 101/43 103/90 109/77 121/56  Pulse:  75 66 82  Temp:  97.5 F (36.4 C) 97.7 F (36.5 C) 97.6 F (36.4 C)  TempSrc:  Oral Oral Oral  Resp:   16 16  Height:      Weight:    170 lb 4.8 oz (77.248 kg)  SpO2:  100% 99% 99%    Intake/Output Summary (Last 24 hours) at 04/21/15 0834 Last data filed at 04/20/15 2100  Gross per 24 hour  Intake    590 ml  Output   1000 ml  Net   -410 ml   Filed Weights   04/19/15 0509 04/20/15 0658 04/21/15 0500  Weight: 169 lb 14.4 oz (77.066 kg) 168 lb 6.4 oz (76.386 kg) 170 lb 4.8 oz (77.248 kg)    PHYSICAL EXAM General: Well developed, well nourished, female in no acute distress. Chin resting on her chest. Head: Normocephalic, atraumatic.  Neck: Supple without bruits, JVD not elevated. Lungs:  Resp regular and unlabored, CTA without wheezing or rales. Heart: Irregularly irregular, S1, S2, no S3, S4, or murmur; no rub. Abdomen: Soft, non-tender, non-distended with normoactive bowel sounds. No hepatomegaly. No rebound/guarding. No obvious abdominal masses. Extremities: No clubbing, cyanosis, or edema. Distal pedal pulses are 2+ bilaterally. Neuro: Alert and oriented X 3. Moves all extremities spontaneously. Psych: Normal  affect.   LABS: Basic Metabolic Panel: Recent Labs  04/20/15 0423 04/21/15 0517  NA 139 139  K 3.9 3.9  CL 105 101  CO2 27 28  GLUCOSE 120* 113*  BUN 18 21*  CREATININE 0.87 0.92  CALCIUM 8.8* 9.0   BNP:  B NATRIURETIC PEPTIDE  Date/Time Value Ref Range Status  04/17/2015 09:50 PM 40.9 0.0 - 100.0 pg/mL Final   TELE:   Atrial-paced, regular rhythm with HR well-controlled in the 80's.       Current Medications:  . amiodarone  400 mg Oral BID  . apixaban  5 mg Oral BID  . diltiazem  120 mg Oral Daily  . furosemide  20 mg Oral 2 times per day on Mon Wed Fri  . furosemide  20 mg Oral Q T,Th,S,Su  . pantoprazole  40 mg Oral Daily  . polyethylene glycol  17 g Oral Daily  . sodium chloride  3 mL Intravenous Q12H      ASSESSMENT AND PLAN:  1)Paroxymal Atrial Fibrillation with RVR: -Telemetry reviewed and shows paced rhythm yesterday afternoon and overnight, HR well controlled in the 70's. -Started on Cardizem CD  on 04/19/2015. Amiodarone  BID added to regimen on 04/20/2015.Would continue current dose for 7 days, decrease to  BID  -CHADVASC 5 ( CHF 1, HTN 1, Age 54, F sex 1). Continue Eliquis for anticoagulation.  2) Acute on chronic Diastolic CHF:  - started  on IV lasix  BID on admission and then converted to home dose Lasix on 04/19/15. - Wt down 5 lbs since admission 173-->168 lbs on 04/20/2015. Reported as 170 lbs on 04/21/2015. I/Os postive but question accuracy - Continue daily weights and Strict I/O's - Appears euvolemic, will continue home regimen of Lasix  po daily and  BID on MWF  3) HTN - BP has been 101/43 - 121/90 in the past 24 hours.  - Continue to hold home Lisinopril due to make room AV nodal blocking agents in the setting of afib with RVR  4) Chest pain in setting of Atrial fibrillation with RVR: -no further complaints of chest discomfort from patient  Likely stable for discharge from Cardiology perspective later today if HR  remains stable. Will need close follow-up. Her son is requesting Home Health PT, which appears was also recommended from PT Evaluation.   Signed, Tara Hughes , PA-C 8:34 AM 04/21/2015 Pager: [phone]  Pt seen and examined  Agree with findings as noted above by B Strader Back in SR   On exam LUngs CTA  Cardiac RRR  Gi II/VI systolic murmur Ext Triv edema  Pt started on amio  Note load above   Probable d/c tomorrow.   Dietrich Pates

## 2015-04-21 NOTE — Progress Notes (Signed)
[facility] PROGRESS NOTE  VERNON ARIEL NUU:[id number] DOB: 1925-07-14 DOA: 04/17/2015 PCP: [name], DO Brief history: 82 year old  With past medical history of PAF , hypertension, hyperlipidemia, s/p PPM admitted for chest pain and was found to be in afib with RVR.  Assessment/Plan: 1. Atypical chest pain: - resolved. ,. No sob.   2. Hypertension: Controlled.  Holding lisinopril.   3. Bilateral lower edema: Improving, on diuresis, on lasix po 20 mg BID MWF and daily the rest of the days.   4. MILD acute on chronic diastolic HF: - ON IV lasix, to po lasix  ON 1/18.  - DAILY bmp   5. PAF; BACK IN sinus today and rate well controlled.  rate control with cardizem 120 mg daily. And amiodarone added by cardiology yesterday.  Resume eliquis. .  Code Status: full code.  Family Communication: none at bedside.  Disposition Plan: pending control of HR, back home with home health PT when HR is better controlled. Probably tomorrow as per cardiology recommendations.    Consultants:  cardiology  Procedures:  none Antibiotics:  none  HPI/Subjective: No chest pain, sob.   Objective: Filed Vitals:   04/21/15 0500 04/21/15 0943  BP: 121/56 115/38  Pulse: 82 80  Temp: 97.6 F (36.4 C)   Resp: 16     Intake/Output Summary (Last 24 hours) at 04/21/15 1124 Last data filed at 04/21/15 0837  Gross per 24 hour  Intake    800 ml  Output   1000 ml  Net   -200 ml   Filed Weights   04/19/15 0509 04/20/15 0658 04/21/15 0500  Weight: 77.066 kg (169 lb 14.4 oz) 76.386 kg (168 lb 6.4 oz) 77.248 kg (170 lb 4.8 oz)    Exam:   General:  Alert afebrile comfortable. Laying in the bed.   Cardiovascular: s1s2, irregularly irregular.   Respiratory: CLEAR NO wheezing or rhonchi  Abdomen: soft NT NDBS+  Musculoskeletal:no  pedal edema.  Data Reviewed: Basic Metabolic Panel:  Recent Labs Lab 04/17/15 2150 04/18/15 0534 04/20/15 0423 04/21/15 0517  NA 142  142 139 139  K 4.1 4.1 3.9 3.9  CL 107 107 105 101  CO2 GLUCOSE 120* 104* 120* 113*  BUN 21*  CREATININE 0.75 0.77 0.87 0.92  CALCIUM 9.1 9.0 8.8* 9.0   Liver Function Tests: No results for input(s): AST, ALT, ALKPHOS, BILITOT, PROT, ALBUMIN in the last 168 hours. No results for input(s): LIPASE, AMYLASE in the last 168 hours. No results for input(s): AMMONIA in the last 168 hours. CBC:  Recent Labs Lab 04/17/15 2150 04/18/15 0534  WBC 8.7 6.7  HGB 12.0 12.4  HCT 36.7 37.6  MCV 91.8 92.2  PLT 286 276   Cardiac Enzymes:  Recent Labs Lab 04/17/15 2200 04/18/15 0534  TROPONINI <0.03 <0.03   BNP (last 3 results)  Recent Labs  04/17/15 2150  BNP 40.9    ProBNP (last 3 results)  Recent Labs  09/01/14 1554 10/11/14 0928  PROBNP 30.0 14.0    CBG: No results for input(s): GLUCAP in the last 168 hours.  No results found for this or any previous visit (from the past 240 hour(s)).   Studies: No results found.  Scheduled Meds: . amiodarone  400 mg Oral BID  . apixaban  5 mg Oral BID  . diltiazem  120 mg Oral Daily  . [START ON 04/22/2015] furosemide  20 mg Oral Once per day on  [place] Link Tue Thu Sat  . [START ON 04/24/2015] furosemide  40 mg Oral Once per day on Mon Wed Fri  . pantoprazole  40 mg Oral Daily  . polyethylene glycol  17 g Oral Daily  . sodium chloride  3 mL Intravenous Q12H   Continuous Infusions:   Principal Problem:   Atrial fibrillation with RVR (HCC) Active Problems:   Chest pain, atypical   Essential hypertension, benign   Anasarca   Debility    Time spent: 25 minutes.     [facility]  [facility] Pager [phone]. If 7PM-7AM, please contact night-coverage at [email or web address], password [facility] 04/21/2015, 11:24 AM  LOS: 3 days

## 2015-04-21 NOTE — Evaluation (Signed)
Clinical/Bedside Swallow Evaluation Patient Details  Name: Tara Hughes MRN: [id number] Date of Birth: [birth year]  Today's Date: 04/21/2015 Time: SLP Start Time (ACUTE ONLY): 1150 SLP Stop Time (ACUTE ONLY): 1212 SLP Time Calculation (min) (ACUTE ONLY): 22 min  Past Medical History:  Past Medical History  Diagnosis Date  . High blood pressure   . High cholesterol   . PERIPHERAL EDEMA 09/13/2009    Qualifier: Diagnosis of  By: Jonny Ruiz MD, Len Blalock   . Paroxysmal atrial fibrillation (HCC) 09/13/2014  . Pacemaker-Biotronik 2006  . Dementia   . GERD (gastroesophageal reflux disease)   . Arthritis   . Chronic diastolic CHF (congestive heart failure) (HCC)     a. 08/2014: EF 65-70% with Grade 1 DD    Past Surgical History:  Past Surgical History  Procedure Laterality Date  . Abdominal hysterectomy    . Tonsillectomy    . Back operation    . Cholecystectomy    . Insert / replace / remove pacemaker     HPI:  82 year old With past medical history of PAF , hypertension, hyperlipidemia, s/p PPM admitted for chest pain and was found to be in afib with RVR.    Assessment / Plan / Recommendation Clinical Impression  Pt demonstrates normal swallow function with no signs of aspiration. However, son and pt report frequent coughing and choking with liquids. Suspect that pt may have a respiratory based dysphagia with episodes of decreased airway protection with pt is short of breath, which she admits to. Today pts breathing was WNL and even with 3 oz of water consumed consecutively pt had no difficulty. Discussed findings and aspiration precautions with pt and family. Despite intermittent coughing, pt has not had pneumonia and would not be recommended to make any diet modifications given no history of aspiration pneumonia. All education complete. Recommend pt continue current diet. No f/u needed.     Aspiration Risk  Mild aspiration risk    Diet Recommendation Regular;Thin liquid   Liquid  Administration via: Cup;Straw Medication Administration: Whole meds with liquid Supervision: Patient able to self feed Compensations: Slow rate;Small sips/bites Postural Changes: Seated upright at 90 degrees    Other  Recommendations Oral Care Recommendations: Oral care BID   Follow up Recommendations  None    Frequency and Duration            Prognosis        Swallow Study   General HPI: 82 year old With past medical history of PAF , hypertension, hyperlipidemia, s/p PPM admitted for chest pain and was found to be in afib with RVR.  Type of Study: Bedside Swallow Evaluation Diet Prior to this Study: Regular;Thin liquids Temperature Spikes Noted: No Respiratory Status: Room air History of Recent Intubation: No Behavior/Cognition: Alert;Cooperative;Pleasant mood Oral Cavity Assessment: Within Functional Limits Oral Care Completed by SLP: No Oral Cavity - Dentition: Edentulous;Dentures, not available Self-Feeding Abilities: Able to feed self Patient Positioning: Upright in chair Baseline Vocal Quality: Normal Volitional Cough: Strong Volitional Swallow: Able to elicit    Oral/Motor/Sensory Function Overall Oral Motor/Sensory Function: Within functional limits   Ice Chips     Thin Liquid Thin Liquid: Within functional limits Presentation: Cup;Straw;Self Fed    Nectar Thick Nectar Thick Liquid: Not tested   Honey Thick Honey Thick Liquid: Not tested   Puree Puree: Within functional limits   Solid   GO   Solid: Within functional limits Presentation: Self Fed       Harlon Ditty, MA  CCC-SLP [phone]  Cherrelle Plante, Riley Nearing 04/21/2015,12:32 PM

## 2015-04-21 NOTE — Progress Notes (Signed)
Physical Therapy Treatment Patient Details Name: Tara Hughes MRN: [id number] DOB: 01-14-1926 Today's Date: 04/21/2015    History of Present Illness pt is an 82 y/o female with h/o HTN, peripheral edema, PAF, and CHF, admitted with complaint of CP, some SOB and just feeling bad for 2 days.    PT Comments    Patient progressing slowly towards PT goals. Continues to require difficulty with bed mobility due to stiffness throughout trunk and limbs requiring Max A and increased time to get to EOB. Improved ambulation distance but fatigues quickly. Due to pt's height, would benefit from youth size RW. Will continue to follow.  Follow Up Recommendations  Home health PT;Supervision for mobility/OOB     Equipment Recommendations  Other (comment) (youth sized 2 wheeled walker)    Recommendations for Other Services       Precautions / Restrictions Precautions Precautions: Fall Restrictions Weight Bearing Restrictions: No    Mobility  Bed Mobility Overal bed mobility: Needs Assistance Bed Mobility: Supine to Sit     Supine to sit: Max assist;HOB elevated     General bed mobility comments: Assist with LES, scooting bottom and to elevate trunk to get to sitting position. Pt not able to assist much.  Transfers Overall transfer level: Needs assistance Equipment used: Rolling walker (2 wheeled) Transfers: Sit to/from Stand Sit to Stand: Mod assist;From elevated surface         General transfer comment: Assist for anterior translation and to rise from EOB. posterior lean. Used to lift chair. Transferred to chair post ambulation bout.   Ambulation/Gait Ambulation/Gait assistance: Min assist Ambulation Distance (Feet): 70 Feet Assistive device: Rolling walker (2 wheeled) Gait Pattern/deviations: Step-through pattern;Shuffle Gait velocity: decreased   General Gait Details: Shuffled steps with body held stiffly with little truncal movement at all. Slow with  turns.   Stairs            Wheelchair Mobility    Modified Rankin (Stroke Patients Only)       Balance Overall balance assessment: Needs assistance Sitting-balance support: Feet supported;Bilateral upper extremity supported Sitting balance-Leahy Scale: Zero Sitting balance - Comments: tending to lean posteriorly. Requires Mod A with moments of Min guard assist for balance EOB. Postural control: Posterior lean Standing balance support: During functional activity Standing balance-Leahy Scale: Poor Standing balance comment: Reliant on Rw for support.                    Cognition Arousal/Alertness: Awake/alert Behavior During Therapy: Flat affect Overall Cognitive Status: History of cognitive impairments - at baseline                      Exercises      General Comments General comments (skin integrity, edema, etc.): Son present during session.      Pertinent Vitals/Pain Pain Assessment: No/denies pain    Home Living                      Prior Function            PT Goals (current goals can now be found in the care plan section) Progress towards PT goals: Progressing toward goals    Frequency  Min 3X/week    PT Plan Current plan remains appropriate;Equipment recommendations need to be updated    Co-evaluation             End of Session Equipment Utilized During Treatment: Gait belt Activity Tolerance: Patient limited by  fatigue;Patient tolerated treatment well Patient left: in chair;with call bell/phone within reach;with family/visitor present     Time: 1610-9604 PT Time Calculation (min) (ACUTE ONLY): 20 min  Charges:  $Gait Training: 8-22 mins                    G Codes:      Tara Hughes 04/21/2015, 10:48 AM Mylo Red, PT, DPT [phone]

## 2015-04-21 NOTE — Care Management Important Message (Signed)
Important Message  Patient Details  Name: Tara Hughes MRN: [id number] Date of Birth: February 04, 1926   Medicare Important Message Given:  Yes    [name] 04/21/2015, 11:56 AM

## 2015-04-21 NOTE — Telephone Encounter (Signed)
S/w son via phone on 04/20/15

## 2015-04-22 LAB — BASIC METABOLIC PANEL
Anion gap: 8 (ref 5–15)
BUN: 17 mg/dL (ref 6–20)
CALCIUM: 8.9 mg/dL (ref 8.9–10.3)
CO2: 27 mmol/L (ref 22–32)
CREATININE: 0.84 mg/dL (ref 0.44–1.00)
Chloride: 106 mmol/L (ref 101–111)
Glucose, Bld: 105 mg/dL — ABNORMAL HIGH (ref 65–99)
Potassium: 4.1 mmol/L (ref 3.5–5.1)
SODIUM: 141 mmol/L (ref 135–145)

## 2015-04-22 MED ORDER — AMIODARONE HCL 400 MG PO TABS: 400.0000 mg | ORAL_TABLET | Freq: Two times a day (BID) | ORAL | Status: DC

## 2015-04-22 MED ORDER — DILTIAZEM HCL ER COATED BEADS 120 MG PO CP24: 120.0000 mg | ORAL_CAPSULE | Freq: Every day | ORAL | Status: DC

## 2015-04-22 MED ORDER — AMIODARONE HCL 200 MG PO TABS: 200.0000 mg | ORAL_TABLET | Freq: Two times a day (BID) | ORAL | Status: DC

## 2015-04-22 NOTE — Progress Notes (Signed)
Subjective:  Complaints of feet burning today.  No complaints of shortness of breath or chest pain.  Appears to be euvolemic today.  Objective:  Vital Signs in the last 24 hours: BP 115/64 mmHg  Pulse 78  Temp(Src) 98 F (36.7 C) (Oral)  Resp 18  Ht  (1.499 m)  Wt 77.202 kg (170 lb 3.2 oz)  BMI 34.36 kg/m2  SpO2 100%  Physical Exam: Pleasant elderly black female in no acute distress Lungs:  Clear  Cardiac:  Regular rhythm, normal S1 and S2, no S3, 2/6 systolic murmur Extremities:  Trace edema present  Intake/Output from previous day: 01/20 0701 - 01/21 0700 In: 720 [P.O.:720] Out: 100 [Urine:100] Weight Filed Weights   04/20/15 0658 04/21/15 0500 04/22/15 0500  Weight: 76.386 kg (168 lb 6.4 oz) 77.248 kg (170 lb 4.8 oz) 77.202 kg (170 lb 3.2 oz)    Lab Results: Basic Metabolic Panel:  Recent Labs  40/98/11 0517 04/22/15 0412  NA 139 141  K 3.9 4.1  CL 101 106  CO2 28 27  GLUCOSE 113* 105*  BUN 21* 17  CREATININE 0.92 0.84   BNP    Component Value Date/Time   BNP 40.9 04/17/2015 2150    PROTIME: Lab Results  Component Value Date   INR 1.1 12/09/2008    Telemetry: Sinus rhythm with some atrial paced rhythm  Assessment/Plan:  1.  Paroxysmal atrial fibrillation currently in sinus rhythm 2.  Tachycardia-bradycardia syndrome 3.  Chronic diastolic heart failure now back on Lasix 4.  History of chest pain resolved following resolution of atrial fibrillation  Recommendations:  Stable for discharge from cardiovascular perspective.  Heart rate has been stable overnight.  Continue amiodarone 400 mg twice daily for 6 more days and then reduce to 200 mg twice daily.  Needs to have an appointment with Dr. [name] in 2 weeks.   Darden Palmer  MD [facility]  04/22/2015, 8:34 AM

## 2015-04-24 NOTE — Discharge Summary (Signed)
Physician Discharge Summary  Tara Hughes:[id number] DOB: November 05, 1925 DOA: 04/17/2015  PCP: Tara Boys, DO  Admit date: 04/17/2015 Discharge date: 04/22/2015  Time spent:30 minutes  Recommendations for Outpatient Follow-up:  Follow up with cardiology as recommended.  Follow up with PCP in one week.   Discharge Diagnoses:  Principal Problem:   Atrial fibrillation with RVR (HCC) Active Problems:   Chest pain, atypical   Essential hypertension, benign   Anasarca   Debility   Discharge Condition: improved  Diet recommendation: low sodium diet.   Filed Weights   04/20/15 0658 04/21/15 0500 04/22/15 0500  Weight: 76.386 kg (168 lb 6.4 oz) 77.248 kg (170 lb 4.8 oz) 77.202 kg (170 lb 3.2 oz)    History of present illness:  82 year old With past medical history of PAF , hypertension, hyperlipidemia, s/p PPM admitted for chest pain and was found to be in afib with RVR.   Hospital Course:  1. Atypical chest pain: - resolved. ,. No sob.   2. Hypertension: Controlled.    3. Bilateral lower edema: Improving, on diuresis, on lasix po 20 mg BID MWF and daily the rest of the days.   4. MILD acute on chronic diastolic HF: Diuresis with IV lasix and changed to po lasix on discharge.    5. PAF; BACK IN sinus today and rate well controlled.  rate control with cardizem 120 mg daily. And amiodarone added by cardiology.  Resume eliquis.   Procedures:    Consultations:  cardiology  Discharge Exam: Filed Vitals:   04/22/15 0500 04/22/15 0845  BP: 115/64 122/54  Pulse: 78 74  Temp: 98 F (36.7 C)   Resp: 18 16    General: alert afebrile comfortable.  Cardiovascular: s1s2 Respiratory: ctab  Discharge Instructions   Discharge Instructions    Diet - low sodium heart healthy    Complete by:  As directed      Discharge instructions    Complete by:  As directed   Follow up with Dr [name] as recommended in 2 weeks.  Please follow up with PCP in one week,  post hospitalization visit.  Please follow up with home health PT, RN as recommended.          Discharge Medication List as of 04/22/2015 12:06 PM    START taking these medications   Details  !! amiodarone (PACERONE) 200 MG tablet Take 1 tablet (200 mg total) by mouth 2 (two) times daily., Starting 04/28/2015, Until Discontinued, Print    !! amiodarone (PACERONE) 400 MG tablet Take 1 tablet (400 mg total) by mouth 2 (two) times daily., Starting 04/22/2015, Until Discontinued, Print    diltiazem (CARDIZEM CD) 120 MG 24 hr capsule Take 1 capsule (120 mg total) by mouth daily., Starting 04/22/2015, Until Discontinued, Print     !! - Potential duplicate medications found. Please discuss with provider.    CONTINUE these medications which have NOT CHANGED   Details  acetaminophen (TYLENOL) 325 MG tablet Take 650 mg by mouth every 6 (six) hours as needed for mild pain., Until Discontinued, Historical Med    apixaban (ELIQUIS) 5 MG TABS tablet Take 1 tablet (5 mg total) by mouth 2 (two) times daily., Starting 09/29/2014, Until Discontinued, Normal    furosemide (LASIX) 20 MG tablet Take 20-40 mg by mouth daily. Take 20 mg twice daily on Mon / Wed / Fri, Until Discontinued, Historical Med    omeprazole (PRILOSEC) 40 MG capsule Take 1 capsule (40 mg total) by mouth  daily., Starting 03/01/2015, Until Discontinued, Normal    potassium chloride (K-DUR) 10 MEQ tablet Take 2 tablets (20 mEq total) by mouth 3 (three) times a week., Starting 10/14/2014, Until Discontinued, Normal      STOP taking these medications     lisinopril (PRINIVIL,ZESTRIL) 5 MG tablet        No Known Allergies Follow-up Information    Follow up with [facility].   Specialty:  Home Health Services   Why:  Registered Nurse, Physical Therapy and Aide   Contact information:   [address] [place] [address] [phone]       Follow up with [facility].   Why:  [employer] officer information:   [address] [place] [address] [phone]       Follow up with Tara Boys, DO. Schedule an appointment as soon as possible for a visit in 1 week.   Specialty:  Internal Medicine   Why:  post hospitalization visit.    Contact information:   [address] [place] [address] [phone]       Follow up with Dietrich Pates, MD. Schedule an appointment as soon as possible for a visit in 2 weeks.   Specialty:  Cardiology   Contact information:   [address] [place] [address] [phone]        The results of significant diagnostics from this hospitalization (including imaging, microbiology, ancillary and laboratory) are listed below for reference.    Significant Diagnostic Studies: Dg Chest 2 View  04/17/2015  CLINICAL DATA:  Acute onset of central chest pain. Initial encounter. EXAM: CHEST  2 VIEW COMPARISON:  Chest radiograph performed 12/09/2008, and CT of the chest performed 01/26/2009 FINDINGS: Evaluation is somewhat suboptimal due to limitations in positioning. The patient's head obscures the lung apices. Mild vascular congestion is noted. Mild bilateral atelectasis is seen. No definite pleural effusion or pneumothorax is identified. The heart is mildly enlarged. A pacemaker is noted at the left chest wall, with leads ending at the right atrium and right ventricle. No acute osseous abnormalities are identified. IMPRESSION: Mild vascular congestion and mild cardiomegaly noted. Mild bilateral atelectasis seen. Electronically Signed   By: Roanna Raider M.D.   On: 04/17/2015 21:55    Microbiology: No results found for this or any previous visit (from the past 240 hour(s)).   Labs: Basic Metabolic Panel:  Recent Labs Lab 04/17/15 2150 04/18/15 0534 04/20/15 0423 04/21/15 0517 04/22/15 0412  NA 142 142 139 139 141  K 4.1 4.1 3.9 3.9 4.1  CL 107 107 105 101 106  CO2 GLUCOSE 120* 104* 120*  113* 105*  BUN 21* 17  CREATININE 0.75 0.77 0.87 0.92 0.84  CALCIUM 9.1 9.0 8.8* 9.0 8.9   Liver Function Tests: No results for input(s): AST, ALT, ALKPHOS, BILITOT, PROT, ALBUMIN in the last 168 hours. No results for input(s): LIPASE, AMYLASE in the last 168 hours. No results for input(s): AMMONIA in the last 168 hours. CBC:  Recent Labs Lab 04/17/15 2150 04/18/15 0534  WBC 8.7 6.7  HGB 12.0 12.4  HCT 36.7 37.6  MCV 91.8 92.2  PLT 286 276   Cardiac Enzymes:  Recent Labs Lab 04/17/15 2200 04/18/15 0534  TROPONINI <0.03 <0.03   BNP: BNP (last 3 results)  Recent Labs  04/17/15 2150  BNP 40.9    ProBNP (last 3  results)  Recent Labs  09/01/14 1554 10/11/14 0928  PROBNP 30.0 14.0    CBG: No results for input(s): GLUCAP in the last 168 hours.     SignedKathlen Mody MD.  [facility] 04/24/2015, 12:49 AM

## 2015-04-27 ENCOUNTER — Telehealth: Payer: Self-pay

## 2015-04-27 NOTE — Telephone Encounter (Signed)
Physical therapist called and stated that patient will continue with physical therapy but she feels that patient could also benefit from OT. She would like to know if this can be added along with PT?   Patient has an OV scheduled for Friday 04-28-2015.  Please advise

## 2015-04-27 NOTE — Telephone Encounter (Signed)
Ok for PT/OT as requested

## 2015-04-28 ENCOUNTER — Ambulatory Visit (INDEPENDENT_AMBULATORY_CARE_PROVIDER_SITE_OTHER): Payer: Medicare Other | Admitting: Internal Medicine

## 2015-04-28 ENCOUNTER — Encounter: Payer: Self-pay | Admitting: Internal Medicine

## 2015-04-28 VITALS — BP 98/72 | HR 83 | Temp 97.8°F | Resp 20 | Ht 59.0 in | Wt 176.8 lb

## 2015-04-28 DIAGNOSIS — R609 Edema, unspecified: Secondary | ICD-10-CM | POA: Diagnosis not present

## 2015-04-28 DIAGNOSIS — R252 Cramp and spasm: Secondary | ICD-10-CM | POA: Diagnosis not present

## 2015-04-28 DIAGNOSIS — I480 Paroxysmal atrial fibrillation: Secondary | ICD-10-CM

## 2015-04-28 DIAGNOSIS — K219 Gastro-esophageal reflux disease without esophagitis: Secondary | ICD-10-CM | POA: Diagnosis not present

## 2015-04-28 DIAGNOSIS — Z950 Presence of cardiac pacemaker: Secondary | ICD-10-CM

## 2015-04-28 DIAGNOSIS — I10 Essential (primary) hypertension: Secondary | ICD-10-CM

## 2015-04-28 MED ORDER — OMEPRAZOLE 40 MG PO CPDR: 40.0000 mg | DELAYED_RELEASE_CAPSULE | Freq: Every day | ORAL | Status: DC

## 2015-04-28 NOTE — Telephone Encounter (Signed)
Called and left a message to notify the Physical Therapist, Tamela Oddi [phone]) that the OT that she requested had been approved by Dr. Montez Morita. I also instructed her to call the office if a written order was necessary.

## 2015-04-28 NOTE — Patient Instructions (Signed)
Will call with lab results  Follow up with cardiology for afib management  Continue current medications as ordered  Follow up in 2 mos for routine visit

## 2015-04-28 NOTE — Progress Notes (Signed)
Patient ID: Tara Hughes, female   DOB: 10-04-1925, 82 y.o.   MRN: [id number]    Location:    [facility]   Place of Service:   OFFICE  Chief Complaint  Patient presents with  . Follow-up    Hospital follow-up  . OTHER    Son in room with patient    HPI:  82 yo female seen today for hospital f/u for afib w RVR, atypical CP, anasarca, HTN. She was started on amiodarone and told to cont cardizem. She was diuresed with IV lasix -->po lasix. She remains on eliquis. Lisinopril was stopped.   She reports feeling better since d/c. No CP or palpitations. Cardio is Dr [name]. She has a PPM. Takes potassium supplement.  GERD - stable on omeprazole daily. She has intermittent belching and epigastric pain. She takes Tums prn  Past Medical History  Diagnosis Date  . High blood pressure   . High cholesterol   . PERIPHERAL EDEMA 09/13/2009    Qualifier: Diagnosis of  By: Jonny Ruiz MD, Len Blalock   . Paroxysmal atrial fibrillation (HCC) 09/13/2014  . Pacemaker-Biotronik 2006  . Dementia   . GERD (gastroesophageal reflux disease)   . Arthritis   . Chronic diastolic CHF (congestive heart failure) (HCC)     a. 08/2014: EF 65-70% with Grade 1 DD     Past Surgical History  Procedure Laterality Date  . Abdominal hysterectomy    . Tonsillectomy    . Back operation    . Cholecystectomy    . Insert / replace / remove pacemaker      Patient Care Team: [name], DO as PCP - General (Internal Medicine)  Social History   Social History  . Marital Status: Widowed    Spouse Name: N/A  . Number of Children: N/A  . Years of Education: N/A   Occupational History  . Not on file.   Social History Main Topics  . Smoking status: Never Smoker   . Smokeless tobacco: Never Used  . Alcohol Use: No  . Drug Use: No  . Sexual Activity: Not on file   Other Topics Concern  . Not on file   Social History Narrative   Diet:      Do you drink/ eat things with caffeine? Coffee,soda      Marital status:  Widow                              What year were you married ?      Do you live in a house, apartment,assistred living, condo, trailer, etc.)?Yes, House      Is it one or more stories? 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                              Type & how often:      Do you have a living will? No      Do you have a DNR form?  No                      If not, do you want to discuss  one? No      Do you have signed POA?HPOA forms? Yes                If so, please bring to your        appointment           reports that she has never smoked. She has never used smokeless tobacco. She reports that she does not drink alcohol or use illicit drugs.  No Known Allergies  Medications: Patient's Medications  New Prescriptions   No medications on file  Previous Medications   ACETAMINOPHEN (TYLENOL) 325 MG TABLET    Take 650 mg by mouth every 6 (six) hours as needed for mild pain.   AMIODARONE (PACERONE) 200 MG TABLET    Take 1 tablet (200 mg total) by mouth 2 (two) times daily.   APIXABAN (ELIQUIS) 5 MG TABS TABLET    Take 1 tablet (5 mg total) by mouth 2 (two) times daily.   DILTIAZEM (CARDIZEM CD) 120 MG 24 HR CAPSULE    Take 1 capsule (120 mg total) by mouth daily.   FUROSEMIDE (LASIX) 20 MG TABLET    Take 20-40 mg by mouth daily. Take 20 mg twice daily on Mon / Wed / Fri   OMEPRAZOLE (PRILOSEC) 40 MG CAPSULE    Take 1 capsule (40 mg total) by mouth daily.   POTASSIUM CHLORIDE (K-DUR) 10 MEQ TABLET    Take 2 tablets (20 mEq total) by mouth 3 (three) times a week.  Modified Medications   No medications on file  Discontinued Medications   AMIODARONE (PACERONE) 400 MG TABLET    Take 1 tablet (400 mg total) by mouth 2 (two) times daily.    Review of Systems  Cardiovascular: Positive for leg swelling.  Gastrointestinal: Positive for abdominal  pain.  Musculoskeletal: Positive for arthralgias.  All other systems reviewed and are negative.   Filed Vitals:   04/28/15 0917  BP: 98/72  Pulse: 83  Temp: 97.8 F (36.6 C)  TempSrc: Oral  Resp: 20  Height: '4\' 11"'$  (1.499 m)  Weight: 176 lb 12.8 oz (80.196 kg)  SpO2: 99%   Body mass index is 35.69 kg/(m^2).  Physical Exam  Constitutional: She is oriented to person, place, and time. She appears well-developed and well-nourished.  HENT:  Mouth/Throat: Oropharynx is clear and moist. No oropharyngeal exudate.  Eyes: Pupils are equal, round, and reactive to light. No scleral icterus.  Neck: Neck supple. Carotid bruit is not present. No tracheal deviation present. No thyromegaly present.  Chronic flexion contracture  Cardiovascular: Normal rate, regular rhythm and intact distal pulses.  Exam reveals no gallop and no friction rub.   Murmur (1/6 SEM) heard. +1 pitting LE edema b/l. no calf TTP.   Pulmonary/Chest: Effort normal and breath sounds normal. No stridor. No respiratory distress. She has no wheezes. She has no rales.  Left ACW pacemaker palpable  Abdominal: Soft. Bowel sounds are normal. She exhibits no distension and no mass. There is no hepatomegaly. There is no tenderness. There is no rebound and no guarding.  Musculoskeletal: She exhibits edema and tenderness.  Lymphadenopathy:    She has no cervical adenopathy.  Neurological: She is alert and oriented to person, place, and time.  Skin: Skin is warm and dry. No rash noted.  Psychiatric: She has a normal mood and affect. Her behavior is normal. Judgment and thought content normal.     Labs reviewed: Admission on 04/17/2015, Discharged on 04/22/2015  Component Date Value  Ref Range Status  . Sodium 04/17/2015 142  135 - 145 mmol/L Final  . Potassium 04/17/2015 4.1  3.5 - 5.1 mmol/L Final  . Chloride 04/17/2015 107  101 - 111 mmol/L Final  . CO2 04/17/2015 24  22 - 32 mmol/L Final  . Glucose, Bld 04/17/2015 120* 65 -  99 mg/dL Final  . BUN 04/17/2015 12  6 - 20 mg/dL Final  . Creatinine, Ser 04/17/2015 0.75  0.44 - 1.00 mg/dL Final  . Calcium 04/17/2015 9.1  8.9 - 10.3 mg/dL Final  . GFR calc non Af Amer 04/17/2015 >60  >60 mL/min Final  . GFR calc Af Amer 04/17/2015 >60  >60 mL/min Final   Comment: (NOTE) The eGFR has been calculated using the CKD EPI equation. This calculation has not been validated in all clinical situations. eGFR's persistently <60 mL/min signify possible Chronic Kidney Disease.   . Anion gap 04/17/2015 11  5 - 15 Final  . WBC 04/17/2015 8.7  4.0 - 10.5 K/uL Final  . RBC 04/17/2015 4.00  3.87 - 5.11 MIL/uL Final  . Hemoglobin 04/17/2015 12.0  12.0 - 15.0 g/dL Final  . HCT 04/17/2015 36.7  36.0 - 46.0 % Final  . MCV 04/17/2015 91.8  78.0 - 100.0 fL Final  . MCH 04/17/2015 30.0  26.0 - 34.0 pg Final  . MCHC 04/17/2015 32.7  30.0 - 36.0 g/dL Final  . RDW 04/17/2015 14.9  11.5 - 15.5 % Final  . Platelets 04/17/2015 286  150 - 400 K/uL Final  . Troponin I 04/17/2015 <0.03  <0.031 ng/mL Final   Comment:        NO INDICATION OF MYOCARDIAL INJURY.   . B Natriuretic Peptide 04/17/2015 40.9  0.0 - 100.0 pg/mL Final  . Color, Urine 04/18/2015 YELLOW  YELLOW Final  . APPearance 04/18/2015 CLOUDY* CLEAR Final  . Specific Gravity, Urine 04/18/2015 1.017  1.005 - 1.030 Final  . pH 04/18/2015 5.5  5.0 - 8.0 Final  . Glucose, UA 04/18/2015 NEGATIVE  NEGATIVE mg/dL Final  . Hgb urine dipstick 04/18/2015 TRACE* NEGATIVE Final  . Bilirubin Urine 04/18/2015 NEGATIVE  NEGATIVE Final  . Ketones, ur 04/18/2015 NEGATIVE  NEGATIVE mg/dL Final  . Protein, ur 04/18/2015 NEGATIVE  NEGATIVE mg/dL Final  . Nitrite 04/18/2015 NEGATIVE  NEGATIVE Final  . Leukocytes, UA 04/18/2015 SMALL* NEGATIVE Final  . Troponin I 04/18/2015 <0.03  <0.031 ng/mL Final   Comment:        NO INDICATION OF MYOCARDIAL INJURY.   . Sodium 04/18/2015 142  135 - 145 mmol/L Final  . Potassium 04/18/2015 4.1  3.5 - 5.1  mmol/L Final  . Chloride 04/18/2015 107  101 - 111 mmol/L Final  . CO2 04/18/2015 27  22 - 32 mmol/L Final  . Glucose, Bld 04/18/2015 104* 65 - 99 mg/dL Final  . BUN 04/18/2015 12  6 - 20 mg/dL Final  . Creatinine, Ser 04/18/2015 0.77  0.44 - 1.00 mg/dL Final  . Calcium 04/18/2015 9.0  8.9 - 10.3 mg/dL Final  . GFR calc non Af Amer 04/18/2015 >60  >60 mL/min Final  . GFR calc Af Amer 04/18/2015 >60  >60 mL/min Final   Comment: (NOTE) The eGFR has been calculated using the CKD EPI equation. This calculation has not been validated in all clinical situations. eGFR's persistently <60 mL/min signify possible Chronic Kidney Disease.   . Anion gap 04/18/2015 8  5 - 15 Final  . WBC 04/18/2015 6.7  4.0 - 10.5 K/uL Final  .  RBC 04/18/2015 4.08  3.87 - 5.11 MIL/uL Final  . Hemoglobin 04/18/2015 12.4  12.0 - 15.0 g/dL Final  . HCT 04/18/2015 37.6  36.0 - 46.0 % Final  . MCV 04/18/2015 92.2  78.0 - 100.0 fL Final  . MCH 04/18/2015 30.4  26.0 - 34.0 pg Final  . MCHC 04/18/2015 33.0  30.0 - 36.0 g/dL Final  . RDW 04/18/2015 14.7  11.5 - 15.5 % Final  . Platelets 04/18/2015 276  150 - 400 K/uL Final  . Squamous Epithelial / LPF 04/18/2015 6-30* NONE SEEN Final  . WBC, UA 04/18/2015 0-5  0 - 5 WBC/hpf Final  . RBC / HPF 04/18/2015 0-5  0 - 5 RBC/hpf Final  . Bacteria, UA 04/18/2015 NONE SEEN  NONE SEEN Final  . Sodium 04/20/2015 139  135 - 145 mmol/L Final  . Potassium 04/20/2015 3.9  3.5 - 5.1 mmol/L Final  . Chloride 04/20/2015 105  101 - 111 mmol/L Final  . CO2 04/20/2015 27  22 - 32 mmol/L Final  . Glucose, Bld 04/20/2015 120* 65 - 99 mg/dL Final  . BUN 04/20/2015 18  6 - 20 mg/dL Final  . Creatinine, Ser 04/20/2015 0.87  0.44 - 1.00 mg/dL Final  . Calcium 04/20/2015 8.8* 8.9 - 10.3 mg/dL Final  . GFR calc non Af Amer 04/20/2015 58* >60 mL/min Final  . GFR calc Af Amer 04/20/2015 >60  >60 mL/min Final   Comment: (NOTE) The eGFR has been calculated using the CKD EPI equation. This  calculation has not been validated in all clinical situations. eGFR's persistently <60 mL/min signify possible Chronic Kidney Disease.   . Anion gap 04/20/2015 7  5 - 15 Final  . Sodium 04/21/2015 139  135 - 145 mmol/L Final  . Potassium 04/21/2015 3.9  3.5 - 5.1 mmol/L Final  . Chloride 04/21/2015 101  101 - 111 mmol/L Final  . CO2 04/21/2015 28  22 - 32 mmol/L Final  . Glucose, Bld 04/21/2015 113* 65 - 99 mg/dL Final  . BUN 04/21/2015 21* 6 - 20 mg/dL Final  . Creatinine, Ser 04/21/2015 0.92  0.44 - 1.00 mg/dL Final  . Calcium 04/21/2015 9.0  8.9 - 10.3 mg/dL Final  . GFR calc non Af Amer 04/21/2015 54* >60 mL/min Final  . GFR calc Af Amer 04/21/2015 >60  >60 mL/min Final   Comment: (NOTE) The eGFR has been calculated using the CKD EPI equation. This calculation has not been validated in all clinical situations. eGFR's persistently <60 mL/min signify possible Chronic Kidney Disease.   . Anion gap 04/21/2015 10  5 - 15 Final  . Sodium 04/22/2015 141  135 - 145 mmol/L Final  . Potassium 04/22/2015 4.1  3.5 - 5.1 mmol/L Final  . Chloride 04/22/2015 106  101 - 111 mmol/L Final  . CO2 04/22/2015 27  22 - 32 mmol/L Final  . Glucose, Bld 04/22/2015 105* 65 - 99 mg/dL Final  . BUN 04/22/2015 17  6 - 20 mg/dL Final  . Creatinine, Ser 04/22/2015 0.84  0.44 - 1.00 mg/dL Final  . Calcium 04/22/2015 8.9  8.9 - 10.3 mg/dL Final  . GFR calc non Af Amer 04/22/2015 >60  >60 mL/min Final  . GFR calc Af Amer 04/22/2015 >60  >60 mL/min Final   Comment: (NOTE) The eGFR has been calculated using the CKD EPI equation. This calculation has not been validated in all clinical situations. eGFR's persistently <60 mL/min signify possible Chronic Kidney Disease.   . Anion gap  04/22/2015 8  5 - 15 Final  Clinical Support on 03/14/2015  Component Date Value Ref Range Status  . Pulse Generator Manufacturer 03/14/2015 BITR   Preliminary  . Date Time Interrogation Session 03/14/2015 [id number]    Preliminary  . Pulse Gen Model 03/14/2015 Evia DR-T   Preliminary  . Pulse Gen Serial Number 03/14/2015 [id number]   Preliminary  . Implantable Pulse Generator Type 03/14/2015 Implantable Pulse Generator   Preliminary  . Implantable Pulse Generator Implan* 03/14/2015 [id number]   Preliminary  . Implantable Lead Manufacturer 03/14/2015 SJCR   Preliminary  . Implantable Lead Model 03/14/2015 Unknown   Preliminary  . Implantable Lead Serial Number 03/14/2015 unknown   Preliminary  . Implantable Lead Implant Date 03/14/2015 [id number]   Preliminary  . Implantable Lead Location 03/14/2015 [id number]   Preliminary  . Implantable Lead Manufacturer 03/14/2015 SJCR   Preliminary  . Implantable Lead Model 03/14/2015 Unknown   Preliminary  . Implantable Lead Serial Number 03/14/2015 unknown   Preliminary  . Implantable Lead Implant Date 03/14/2015 [id number]   Preliminary  . Implantable Lead Location 03/14/2015 [id number]   Preliminary  . Lead Channel Setting Sensing Sensi* 03/14/2015 2.5   Preliminary  . Lead Channel Setting Pacing Amplit* 03/14/2015 1.5   Preliminary  . Lead Channel Setting Pacing Pulse * 03/14/2015 0.4   Preliminary  . Lead Channel Setting Pacing Amplit* 03/14/2015 2.5   Preliminary  . Lead Channel Impedance Value 03/14/2015 488   Preliminary  . Lead Channel Sensing Intrinsic Amp* 03/14/2015 2.4   Preliminary  . Lead Channel Pacing Threshold Ampl* 03/14/2015 0.6   Preliminary  . Lead Channel Pacing Threshold Puls* 03/14/2015 0.4   Preliminary  . Lead Channel Impedance Value 03/14/2015 585   Preliminary  . Lead Channel Sensing Intrinsic Amp* 03/14/2015 14.6   Preliminary  . Battery Status 03/14/2015 OK   Preliminary  . Huston Foley Statistic RA Percent Paced 03/14/2015 27   Preliminary  . Huston Foley Statistic RV Percent Paced 03/14/2015 0   Preliminary  . Eval Rhythm 03/14/2015 SR   Preliminary  Office Visit on 03/01/2015  Component Date Value Ref Range Status  . Glucose 03/01/2015 119* 65 - 99  mg/dL Final  . BUN 65/16/8610 18  8 - 27 mg/dL Final  . Creatinine, Ser 03/01/2015 0.75  0.57 - 1.00 mg/dL Final  . GFR calc non Af Amer 03/01/2015 72  >59 mL/min/1.73 Final  . GFR calc Af Amer 03/01/2015 83  >59 mL/min/1.73 Final  . BUN/Creatinine Ratio 03/01/2015 24  11 - 26 Final  . Sodium 03/01/2015 139  136 - 144 mmol/L Final   Comment: **Effective March 13, 2015 the reference interval**   for Sodium, Serum will be changing to:                                             134 - 144   . Potassium 03/01/2015 3.9  3.5 - 5.2 mmol/L Final  . Chloride 03/01/2015 98  97 - 106 mmol/L Final   Comment: **Effective March 13, 2015 the reference interval**   for Chloride, Serum will be changing to:                                              96 -  106   . CO2 03/01/2015 24  18 - 29 mmol/L Final  . Calcium 03/01/2015 9.5  8.7 - 10.3 mg/dL Final    Dg Chest 2 View  04/17/2015  CLINICAL DATA:  Acute onset of central chest pain. Initial encounter. EXAM: CHEST  2 VIEW COMPARISON:  Chest radiograph performed 12/09/2008, and CT of the chest performed 01/26/2009 FINDINGS: Evaluation is somewhat suboptimal due to limitations in positioning. The patient's head obscures the lung apices. Mild vascular congestion is noted. Mild bilateral atelectasis is seen. No definite pleural effusion or pneumothorax is identified. The heart is mildly enlarged. A pacemaker is noted at the left chest wall, with leads ending at the right atrium and right ventricle. No acute osseous abnormalities are identified. IMPRESSION: Mild vascular congestion and mild cardiomegaly noted. Mild bilateral atelectasis seen. Electronically Signed   By: Garald Balding M.D.   On: 04/17/2015 21:55     Assessment/Plan   ICD-9-CM ICD-10-CM   1. Gastroesophageal reflux disease without esophagitis 530.81 K21.9 omeprazole (PRILOSEC) 40 MG capsule  2. Paroxysmal atrial fibrillation (HCC) 427.31 I48.0   3. Pacemaker V45.01 Z95.0   4. Cramp of  both lower extremities 729.82 R25.2 Magnesium  5. Edema, unspecified type 782.3 A21.3 Basic Metabolic Panel  6. Essential hypertension, benign 401.1 I10    Follow up with cardiology for afib management  Continue current medications as ordered  May eat 1 tsp yellow mustard for leg cramps prn  Follow up in 2 mos for routine visit  Denard Tuminello S. Perlie Gold  [facility] [address] [place], [place] [address] [phone] Cell (Monday-Friday 8 AM - 5 PM) [phone] After 5 PM and follow prompts

## 2015-04-29 LAB — BASIC METABOLIC PANEL
BUN/Creatinine Ratio: 13 (ref 11–26)
BUN: 10 mg/dL (ref 8–27)
CALCIUM: 9 mg/dL (ref 8.7–10.3)
CO2: 21 mmol/L (ref 18–29)
CREATININE: 0.76 mg/dL (ref 0.57–1.00)
Chloride: 100 mmol/L (ref 96–106)
GFR calc non Af Amer: 71 mL/min/{1.73_m2} (ref >59)
GFR, EST AFRICAN AMERICAN: 82 mL/min/{1.73_m2} (ref >59)
Glucose: 139 mg/dL — ABNORMAL HIGH (ref 65–99)
POTASSIUM: 4.1 mmol/L (ref 3.5–5.2)
Sodium: 138 mmol/L (ref 134–144)

## 2015-04-29 LAB — MAGNESIUM: Magnesium: 2.4 mg/dL — ABNORMAL HIGH (ref 1.6–2.3)

## 2015-05-02 ENCOUNTER — Telehealth: Payer: Self-pay | Admitting: Internal Medicine

## 2015-05-02 NOTE — Telephone Encounter (Signed)
Spoke w/ pt and he wanted to schedule 2 week post hosp with Dr. [name]. Phone call forwarded to triage.

## 2015-05-02 NOTE — Telephone Encounter (Signed)
Patient's son called frustrated that he has not been contacted to schedule 2 week appointment with Dr. [name]. Patient was discharged from the hospital 1/21 and per caller, "Dr. [name] said she'd call and schedule a 2-week appointment."  Informed him that Dr. [name]' nurse is not here today, but she will call Thursday with an appointment plan.  Caller agrees.

## 2015-05-02 NOTE — Telephone Encounter (Signed)
°  New Prob   Pts family calling requesting for pt to be seen sooner. Offered first available and he declined.

## 2015-05-05 NOTE — Telephone Encounter (Signed)
I spoke with patient's son Tara Hughes, informed of appointment opening up this Monday.  He is appreciative for this.

## 2015-05-05 NOTE — Telephone Encounter (Signed)
Scheduled patient to see Dr. [name] Monday, Feb 6 @ 9:45 am Left a message on voice mail for son Candace Cruise to call back.

## 2015-05-08 ENCOUNTER — Encounter: Payer: Self-pay | Admitting: Internal Medicine

## 2015-05-08 ENCOUNTER — Ambulatory Visit (INDEPENDENT_AMBULATORY_CARE_PROVIDER_SITE_OTHER): Payer: Medicare Other | Admitting: Internal Medicine

## 2015-05-08 VITALS — BP 138/86 | HR 76 | Ht 59.0 in | Wt 175.0 lb

## 2015-05-08 DIAGNOSIS — I5032 Chronic diastolic (congestive) heart failure: Secondary | ICD-10-CM | POA: Diagnosis not present

## 2015-05-08 DIAGNOSIS — I4891 Unspecified atrial fibrillation: Secondary | ICD-10-CM | POA: Diagnosis not present

## 2015-05-08 DIAGNOSIS — I10 Essential (primary) hypertension: Secondary | ICD-10-CM

## 2015-05-08 DIAGNOSIS — R600 Localized edema: Secondary | ICD-10-CM

## 2015-05-08 LAB — COMPREHENSIVE METABOLIC PANEL
ALK PHOS: 95 U/L (ref 33–130)
ALT: 13 U/L (ref 6–29)
AST: 15 U/L (ref 10–35)
Albumin: 3.9 g/dL (ref 3.6–5.1)
BILIRUBIN TOTAL: 0.3 mg/dL (ref 0.2–1.2)
BUN: 13 mg/dL (ref 7–25)
CO2: 21 mmol/L (ref 20–31)
CREATININE: 0.84 mg/dL (ref 0.60–0.88)
Calcium: 9.2 mg/dL (ref 8.6–10.4)
Chloride: 101 mmol/L (ref 98–110)
GLUCOSE: 114 mg/dL — AB (ref 65–99)
POTASSIUM: 4 mmol/L (ref 3.5–5.3)
Sodium: 137 mmol/L (ref 135–146)
TOTAL PROTEIN: 8.1 g/dL (ref 6.1–8.1)

## 2015-05-08 LAB — TSH: TSH: 4.7 m[IU]/L — AB

## 2015-05-08 LAB — CBC
HCT: 39.4 % (ref 36.0–46.0)
HEMOGLOBIN: 13.6 g/dL (ref 12.0–15.0)
MCH: 31.3 pg (ref 26.0–34.0)
MCHC: 34.5 g/dL (ref 30.0–36.0)
MCV: 90.6 fL (ref 78.0–100.0)
MPV: 10.7 fL (ref 8.6–12.4)
PLATELETS: 289 10*3/uL (ref 150–400)
RBC: 4.35 MIL/uL (ref 3.87–5.11)
RDW: 14.5 % (ref 11.5–15.5)
WBC: 7.8 10*3/uL (ref 4.0–10.5)

## 2015-05-08 LAB — BRAIN NATRIURETIC PEPTIDE: BRAIN NATRIURETIC PEPTIDE: 11.8 pg/mL (ref <100)

## 2015-05-08 NOTE — Progress Notes (Signed)
Cardiology Office Note   Date:  05/08/2015   ID:  DAMON BAISCH, DOB 1925-05-27, MRN [id number]  PCP:  [name], DO  Cardiologist:   Dietrich Pates, MD    F/U of atrial fib (PAF) and diastolic CHF     History of Present Illness: Tara Hughes is a 82 y.o. female with a history of PAF (on Eliquis), HTN, HLD, Tachy-brady syndrome (s/p PPM 08/2004) and GERD who presented to [facility] ED on 04/17/2015 for right sided-chest pain. Her initial EKG showed atrial fibrillation w/ RVR, HR of 127. She was in/out of afib  Eventually put on amiodarone before d/c  Since she left the hosp she has felt OK  No chest pressure  No palpitations  Appetite is good     Current Outpatient Prescriptions  Medication Sig Dispense Refill  . acetaminophen (TYLENOL) 325 MG tablet Take 650 mg by mouth every 6 (six) hours as needed for mild pain.    Marland Kitchen amiodarone (PACERONE) 200 MG tablet Take 1 tablet (200 mg total) by mouth 2 (two) times daily. 60 tablet 0  . apixaban (ELIQUIS) 5 MG TABS tablet Take 1 tablet (5 mg total) by mouth 2 (two) times daily. 60 tablet 12  . diltiazem (CARDIZEM CD) 120 MG 24 hr capsule Take 1 capsule (120 mg total) by mouth daily. 30 capsule 0  . furosemide (LASIX) 20 MG tablet Take 20-40 mg by mouth daily. Take 20 mg twice daily on Mon / Wed / Fri    . omeprazole (PRILOSEC) 40 MG capsule Take 1 capsule (40 mg total) by mouth daily. 90 capsule 3  . potassium chloride (K-DUR) 10 MEQ tablet Take 2 tablets (20 mEq total) by mouth 3 (three) times a week. 90 tablet 3   No current facility-administered medications for this visit.    Allergies:   Review of patient's allergies indicates no known allergies.   Past Medical History  Diagnosis Date  . High blood pressure   . High cholesterol   . PERIPHERAL EDEMA 09/13/2009    Qualifier: Diagnosis of  By: Jonny Ruiz MD, Len Blalock   . Paroxysmal atrial fibrillation (HCC) 09/13/2014  . Pacemaker-Biotronik 2006  . Dementia   . GERD  (gastroesophageal reflux disease)   . Arthritis   . Chronic diastolic CHF (congestive heart failure) (HCC)     a. 08/2014: EF 65-70% with Grade 1 DD     Past Surgical History  Procedure Laterality Date  . Abdominal hysterectomy    . Tonsillectomy    . Back operation    . Cholecystectomy    . Insert / replace / remove pacemaker       Social History:  The patient  reports that she has never smoked. She has never used smokeless tobacco. She reports that she does not drink alcohol or use illicit drugs.   Family History:  The patient's family history includes Cancer in her father; Diabetes in her brother; Diverticulitis in her sister; Heart disease in her sister.    ROS:  Please see the history of present illness. All other systems are reviewed and  Negative to the above problem except as noted.    PHYSICAL EXAM: VS:  BP 138/86 mmHg  Pulse 76  Ht  (1.499 m)  Wt 175 lb (79.379 kg)  BMI 35.33 kg/m2  SpO2 97%  GEN: Well nourished, well developed, in no acute distress HEENT: normal Neck: no JVD, carotid bruits, or masses Cardiac: RRR; no murmurs, rubs, or gallops,Tr  edema R greater than L   Respiratory:  clear to auscultation bilaterally, normal work of breathing GI: soft, nontender, nondistended, + BS  No hepatomegaly  MS: no deformity Moving all extremities   Skin: warm and dry, no rash Neuro:  Strength and sensation are intact Psych: euthymic mood, full affect   EKG:  EKG is not  ordered today.   Lipid Panel    Component Value Date/Time   CHOL 174 11/23/2014 1153   CHOL 186 03/08/2009 1034   TRIG 100 11/23/2014 1153   HDL 48 11/23/2014 1153   HDL 52.40 03/08/2009 1034   CHOLHDL 3.6 11/23/2014 1153   CHOLHDL 4 03/08/2009 1034   VLDL 15.8 03/08/2009 1034   LDLCALC 106* 11/23/2014 1153   LDLCALC 118* 03/08/2009 1034      Wt Readings from Last 3 Encounters:  05/08/15 175 lb (79.379 kg)  04/28/15 176 lb 12.8 oz (80.196 kg)  04/22/15 170 lb 3.2 oz (77.202  kg)      ASSESSMENT AND PLAN: 1  Atrial fib  Maintaining SR on current regimen  Keep on current dose of amiodarone for now  Check labs  Keep on anticoag  2.  Chronic diastolic CHF  Volume status is pretty good  Keep on same regimen    Labs :  CMET, TSH , CBC and BNP    Current medicines are reviewed at length with the patient today.  The patient does not have concerns regarding medicines.  The following changes have been made:   Labs/ tests ordered today include: No orders of the defined types were placed in this encounter.     Disposition:   FU with me in 6 wks    Signed, Dietrich Pates, MD  05/08/2015 9:32 AM    [facility] [address] [place], [place], [place]  [address] Phone: [phone]; Fax: [phone]

## 2015-05-08 NOTE — Patient Instructions (Signed)
Your physician recommends that you continue on your current medications as directed. Please refer to the Current Medication list given to you today. Your physician recommends that you return for lab work TODAY (CMET, TSH, CBC, BNP)  Your physician recommends that you schedule a follow-up appointment in: 6 WEEKS WITH DR. [name].  MARCH 27, 9:45 AM

## 2015-05-24 ENCOUNTER — Other Ambulatory Visit: Payer: Self-pay | Admitting: Internal Medicine

## 2015-05-24 ENCOUNTER — Other Ambulatory Visit: Payer: Self-pay | Admitting: *Deleted

## 2015-05-24 MED ORDER — AMIODARONE HCL 200 MG PO TABS: 200.0000 mg | ORAL_TABLET | Freq: Two times a day (BID) | ORAL | Status: DC

## 2015-05-24 MED ORDER — DILTIAZEM HCL ER COATED BEADS 120 MG PO CP24: 120.0000 mg | ORAL_CAPSULE | Freq: Every day | ORAL | Status: DC

## 2015-05-24 NOTE — Telephone Encounter (Signed)
DPR FOR DONNA MICHAEL AND RUSSELL MOSES.

## 2015-05-29 ENCOUNTER — Telehealth: Payer: Self-pay | Admitting: *Deleted

## 2015-05-29 NOTE — Telephone Encounter (Signed)
Received a phone call from son regarding his mother's left hip and knee being very swollen due to her retaining water he stated. He also stated that his mother has not had a bowel movement in 3-4 days, stool softener is not working at all. I informed the son that we did not have any opening here in the office today and I recommend that he take her to the emergency room to be evaluated.

## 2015-05-30 ENCOUNTER — Emergency Department (HOSPITAL_COMMUNITY)
Admission: EM | Admit: 2015-05-30 | Discharge: 2015-05-30 | Disposition: A | Discharge status: Home / Self Care | Payer: Medicare Other | Attending: Emergency Medicine | Admitting: Emergency Medicine

## 2015-05-30 ENCOUNTER — Encounter (HOSPITAL_COMMUNITY): Payer: Self-pay | Admitting: *Deleted

## 2015-05-30 ENCOUNTER — Emergency Department (HOSPITAL_COMMUNITY): Payer: Medicare Other

## 2015-05-30 DIAGNOSIS — F0390 Unspecified dementia without behavioral disturbance: Secondary | ICD-10-CM | POA: Insufficient documentation

## 2015-05-30 DIAGNOSIS — I10 Essential (primary) hypertension: Secondary | ICD-10-CM | POA: Insufficient documentation

## 2015-05-30 DIAGNOSIS — K219 Gastro-esophageal reflux disease without esophagitis: Secondary | ICD-10-CM | POA: Insufficient documentation

## 2015-05-30 DIAGNOSIS — Z79899 Other long term (current) drug therapy: Secondary | ICD-10-CM | POA: Diagnosis not present

## 2015-05-30 DIAGNOSIS — Z950 Presence of cardiac pacemaker: Secondary | ICD-10-CM | POA: Diagnosis not present

## 2015-05-30 DIAGNOSIS — M1990 Unspecified osteoarthritis, unspecified site: Secondary | ICD-10-CM | POA: Insufficient documentation

## 2015-05-30 DIAGNOSIS — K5900 Constipation, unspecified: Secondary | ICD-10-CM | POA: Diagnosis present

## 2015-05-30 DIAGNOSIS — Z8639 Personal history of other endocrine, nutritional and metabolic disease: Secondary | ICD-10-CM | POA: Insufficient documentation

## 2015-05-30 DIAGNOSIS — I5032 Chronic diastolic (congestive) heart failure: Secondary | ICD-10-CM | POA: Insufficient documentation

## 2015-05-30 DIAGNOSIS — I480 Paroxysmal atrial fibrillation: Secondary | ICD-10-CM | POA: Diagnosis not present

## 2015-05-30 DIAGNOSIS — Z7901 Long term (current) use of anticoagulants: Secondary | ICD-10-CM | POA: Diagnosis not present

## 2015-05-30 LAB — COMPREHENSIVE METABOLIC PANEL
ALBUMIN: 3.2 g/dL — AB (ref 3.5–5.0)
ALT: 14 U/L (ref 14–54)
AST: 17 U/L (ref 15–41)
Alkaline Phosphatase: 74 U/L (ref 38–126)
Anion gap: 10 (ref 5–15)
BILIRUBIN TOTAL: 0.2 mg/dL — AB (ref 0.3–1.2)
BUN: 8 mg/dL (ref 6–20)
CHLORIDE: 104 mmol/L (ref 101–111)
CO2: 26 mmol/L (ref 22–32)
CREATININE: 0.72 mg/dL (ref 0.44–1.00)
Calcium: 9.1 mg/dL (ref 8.9–10.3)
GFR calc Af Amer: >60 mL/min (ref >60)
GFR calc non Af Amer: >60 mL/min (ref >60)
GLUCOSE: 101 mg/dL — AB (ref 65–99)
POTASSIUM: 4 mmol/L (ref 3.5–5.1)
Sodium: 140 mmol/L (ref 135–145)
Total Protein: 7.7 g/dL (ref 6.5–8.1)

## 2015-05-30 LAB — CBC WITH DIFFERENTIAL/PLATELET
BASOS PCT: 1 %
Basophils Absolute: 0.1 10*3/uL (ref 0.0–0.1)
EOS PCT: 2 %
Eosinophils Absolute: 0.2 10*3/uL (ref 0.0–0.7)
HEMATOCRIT: 37.9 % (ref 36.0–46.0)
Hemoglobin: 12.8 g/dL (ref 12.0–15.0)
LYMPHS PCT: 30 %
Lymphs Abs: 2.5 10*3/uL (ref 0.7–4.0)
MCH: 31.1 pg (ref 26.0–34.0)
MCHC: 33.8 g/dL (ref 30.0–36.0)
MCV: 92.2 fL (ref 78.0–100.0)
MONO ABS: 0.7 10*3/uL (ref 0.1–1.0)
Monocytes Relative: 8 %
NEUTROS PCT: 59 %
Neutro Abs: 4.9 10*3/uL (ref 1.7–7.7)
PLATELETS: 269 10*3/uL (ref 150–400)
RBC: 4.11 MIL/uL (ref 3.87–5.11)
RDW: 15.2 % (ref 11.5–15.5)
WBC: 8.4 10*3/uL (ref 4.0–10.5)

## 2015-05-30 LAB — I-STAT CG4 LACTIC ACID, ED
LACTIC ACID, VENOUS: 1.66 mmol/L (ref 0.5–2.0)
Lactic Acid, Venous: 1.6 mmol/L (ref 0.5–2.0)

## 2015-05-30 LAB — LIPASE, BLOOD: Lipase: 23 U/L (ref 11–51)

## 2015-05-30 MED ORDER — FLEET ENEMA 7-19 GM/118ML RE ENEM
1.0000 | ENEMA | Freq: Once | RECTAL | Status: AC
  Administered 2015-05-30: 1 via RECTAL
  Filled 2015-05-30: qty 1

## 2015-05-30 MED ORDER — POLYETHYLENE GLYCOL 3350 17 GM/SCOOP PO POWD: 17.0000 g | Freq: Every day | ORAL | Status: DC

## 2015-05-30 MED ORDER — IOHEXOL 300 MG/ML  SOLN
100.0000 mL | Freq: Once | INTRAMUSCULAR | Status: AC | PRN
  Administered 2015-05-30: 100 mL via INTRAVENOUS

## 2015-05-30 MED ORDER — DOCUSATE SODIUM 100 MG PO CAPS: 100.0000 mg | ORAL_CAPSULE | Freq: Every day | ORAL | Status: DC

## 2015-05-30 NOTE — ED Notes (Signed)
Pt arrives from home via [facility] c/o constipation. Pt family states that she has not had a bowel movement in 5 days and they have tried some laxatives with no success. Denies nausea and vomiting.

## 2015-05-30 NOTE — Discharge Instructions (Signed)
Please follow-up closely with your primary care doctor for follow-up in 2-3 days. Return for worsening symptoms, including fever, abdominal pain, vomiting, or any other symptoms concerning to you.  Constipation, Adult Constipation is when a person has fewer than three bowel movements a week, has difficulty having a bowel movement, or has stools that are dry, hard, or larger than normal. As people grow older, constipation is more common. A low-fiber diet, not taking in enough fluids, and taking certain medicines may make constipation worse.  CAUSES   Certain medicines, such as antidepressants, pain medicine, iron supplements, antacids, and water pills.   Certain diseases, such as diabetes, irritable bowel syndrome (IBS), thyroid disease, or depression.   Not drinking enough water.   Not eating enough fiber-rich foods.   Stress or travel.   Lack of physical activity or exercise.   Ignoring the urge to have a bowel movement.   Using laxatives too much.  SIGNS AND SYMPTOMS   Having fewer than three bowel movements a week.   Straining to have a bowel movement.   Having stools that are hard, dry, or larger than normal.   Feeling full or bloated.   Pain in the lower abdomen.   Not feeling relief after having a bowel movement.  DIAGNOSIS  Your health care provider will take a medical history and perform a physical exam. Further testing may be done for severe constipation. Some tests may include:  A barium enema X-ray to examine your rectum, colon, and, sometimes, your small intestine.   A sigmoidoscopy to examine your lower colon.   A colonoscopy to examine your entire colon. TREATMENT  Treatment will depend on the severity of your constipation and what is causing it. Some dietary treatments include drinking more fluids and eating more fiber-rich foods. Lifestyle treatments may include regular exercise. If these diet and lifestyle recommendations do not help, your  health care provider may recommend taking over-the-counter laxative medicines to help you have bowel movements. Prescription medicines may be prescribed if over-the-counter medicines do not work.  HOME CARE INSTRUCTIONS   Eat foods that have a lot of fiber, such as fruits, vegetables, whole grains, and beans.  Limit foods high in fat and processed sugars, such as french fries, hamburgers, cookies, candies, and soda.   A fiber supplement may be added to your diet if you cannot get enough fiber from foods.   Drink enough fluids to keep your urine clear or pale yellow.   Exercise regularly or as directed by your health care provider.   Go to the restroom when you have the urge to go. Do not hold it.   Only take over-the-counter or prescription medicines as directed by your health care provider. Do not take other medicines for constipation without talking to your health care provider first.  SEEK IMMEDIATE MEDICAL CARE IF:   You have bright red blood in your stool.   Your constipation lasts for more than 4 days or gets worse.   You have abdominal or rectal pain.   You have thin, pencil-like stools.   You have unexplained weight loss. MAKE SURE YOU:   Understand these instructions.  Will watch your condition.  Will get help right away if you are not doing well or get worse.   This information is not intended to replace advice given to you by your health care provider. Make sure you discuss any questions you have with your health care provider.   Document Released: 12/15/2003 Document Revised: 04/08/2014 Document  Reviewed: 12/28/2012 Elsevier Interactive Patient Education 2016 Elsevier Inc.  High-Fiber Diet Fiber, also called dietary fiber, is a type of carbohydrate found in fruits, vegetables, whole grains, and beans. A high-fiber diet can have many health benefits. Your health care provider may recommend a high-fiber diet to help:  Prevent constipation. Fiber can  make your bowel movements more regular.  Lower your cholesterol.  Relieve hemorrhoids, uncomplicated diverticulosis, or irritable bowel syndrome.  Prevent overeating as part of a weight-loss plan.  Prevent heart disease, type 2 diabetes, and certain cancers. WHAT IS MY PLAN? The recommended daily intake of fiber includes:  38 grams for men under age 54.  30 grams for men over age 28.  25 grams for women under age 65.  21 grams for women over age 30. You can get the recommended daily intake of dietary fiber by eating a variety of fruits, vegetables, grains, and beans. Your health care provider may also recommend a fiber supplement if it is not possible to get enough fiber through your diet. WHAT DO I NEED TO KNOW ABOUT A HIGH-FIBER DIET?  Fiber supplements have not been widely studied for their effectiveness, so it is better to get fiber through food sources.  Always check the fiber content on thenutrition facts label of any prepackaged food. Look for foods that contain at least 5 grams of fiber per serving.  Ask your dietitian if you have questions about specific foods that are related to your condition, especially if those foods are not listed in the following section.  Increase your daily fiber consumption gradually. Increasing your intake of dietary fiber too quickly may cause bloating, cramping, or gas.  Drink plenty of water. Water helps you to digest fiber. WHAT FOODS CAN I EAT? Grains Whole-grain breads. Multigrain cereal. Oats and oatmeal. Brown rice. Barley. Bulgur wheat. Millet. Bran muffins. Popcorn. Rye wafer crackers. Vegetables Sweet potatoes. Spinach. Kale. Artichokes. Cabbage. Broccoli. Green peas. Carrots. Squash. Fruits Berries. Pears. Apples. Oranges. Avocados. Prunes and raisins. Dried figs. Meats and Other Protein Sources Navy, kidney, pinto, and soy beans. Split peas. Lentils. Nuts and seeds. Dairy Fiber-fortified yogurt. Beverages Fiber-fortified soy  milk. Fiber-fortified orange juice. Other Fiber bars. The items listed above may not be a complete list of recommended foods or beverages. Contact your dietitian for more options. WHAT FOODS ARE NOT RECOMMENDED? Grains White bread. Pasta made with refined flour. White rice. Vegetables Fried potatoes. Canned vegetables. Well-cooked vegetables.  Fruits Fruit juice. Cooked, strained fruit. Meats and Other Protein Sources Fatty cuts of meat. Fried Environmental education officer or fried fish. Dairy Milk. Yogurt. Cream cheese. Sour cream. Beverages Soft drinks. Other Cakes and pastries. Butter and oils. The items listed above may not be a complete list of foods and beverages to avoid. Contact your dietitian for more information. WHAT ARE SOME TIPS FOR INCLUDING HIGH-FIBER FOODS IN MY DIET?  Eat a wide variety of high-fiber foods.  Make sure that half of all grains consumed each day are whole grains.  Replace breads and cereals made from refined flour or white flour with whole-grain breads and cereals.  Replace white rice with brown rice, bulgur wheat, or millet.  Start the day with a breakfast that is high in fiber, such as a cereal that contains at least 5 grams of fiber per serving.  Use beans in place of meat in soups, salads, or pasta.  Eat high-fiber snacks, such as berries, raw vegetables, nuts, or popcorn.   This information is not intended to replace advice given  to you by your health care provider. Make sure you discuss any questions you have with your health care provider.   Document Released: 03/18/2005 Document Revised: 04/08/2014 Document Reviewed: 08/31/2013 Elsevier Interactive Patient Education [employer].

## 2015-05-30 NOTE — ED Notes (Signed)
Pt here with family member advises pt hasn't had a bowel movement in 5 days. Pt has distended abdomen c/o "discomfort".

## 2015-05-30 NOTE — ED Provider Notes (Signed)
CSN: [id number]     Arrival date & time 05/30/15  4098 History   First MD Initiated Contact with Patient 05/30/15 1012     Chief Complaint  Patient presents with  . Constipation     (Consider location/radiation/quality/duration/timing/severity/associated sxs/prior Treatment) HPI 82 year old female who presents with constipation. History of hypertension, hyperlipidemia, paroxysmal atrial fibrillation with permanent pacemaker, abdominal hysterectomy and cholecystectomy. Lives with her son. States that she has not had a bowel movement in about 5 days. Has had constipation in the past that has been relieved with over-the-counter medications, and she has not tried any medications this week. Has noted increasing abdominal distention with intermittent abdominal pain. Has nausea after eating but no vomiting. No fevers, dysuria, or urinary frequency. States that she still feels that she is passing gas. Past Medical History  Diagnosis Date  . High blood pressure   . High cholesterol   . PERIPHERAL EDEMA 09/13/2009    Qualifier: Diagnosis of  By: Jonny Ruiz MD, Len Blalock   . Paroxysmal atrial fibrillation (HCC) 09/13/2014  . Pacemaker-Biotronik 2006  . Dementia   . GERD (gastroesophageal reflux disease)   . Arthritis   . Chronic diastolic CHF (congestive heart failure) (HCC)     a. 08/2014: EF 65-70% with Grade 1 DD    Past Surgical History  Procedure Laterality Date  . Abdominal hysterectomy    . Tonsillectomy    . Back operation    . Cholecystectomy    . Insert / replace / remove pacemaker     Family History  Problem Relation Age of Onset  . Cancer Father   . Diabetes Brother   . Diverticulitis Sister   . Heart disease Sister    Social History  Substance Use Topics  . Smoking status: Never Smoker   . Smokeless tobacco: Never Used  . Alcohol Use: No   OB History    No data available     Review of Systems 10/14 systems reviewed and are negative other than those stated in the  HPI   Allergies  Review of patient's allergies indicates no known allergies.  Home Medications   Prior to Admission medications   Medication Sig Start Date End Date Taking? Authorizing Provider  acetaminophen (TYLENOL) 325 MG tablet Take 650 mg by mouth every 6 (six) hours as needed for mild pain.   Yes Historical Provider, MD  amiodarone (PACERONE) 200 MG tablet Take 1 tablet (200 mg total) by mouth 2 (two) times daily. 05/24/15  Yes Pricilla Riffle, MD  apixaban (ELIQUIS) 5 MG TABS tablet Take 1 tablet (5 mg total) by mouth 2 (two) times daily. 09/29/14  Yes Pricilla Riffle, MD  diltiazem (CARDIZEM CD) 120 MG 24 hr capsule Take 1 capsule (120 mg total) by mouth daily. 05/24/15  Yes Pricilla Riffle, MD  furosemide (LASIX) 20 MG tablet Take 20-40 mg by mouth daily. Take 20 mg twice daily on Mon / Wed / Fri   Yes Historical Provider, MD  omeprazole (PRILOSEC) 40 MG capsule Take 1 capsule (40 mg total) by mouth daily. 04/28/15  Yes [name], DO  potassium chloride (K-DUR) 10 MEQ tablet Take 2 tablets (20 mEq total) by mouth 3 (three) times a week. 10/14/14  Yes Pricilla Riffle, MD  docusate sodium (COLACE) 100 MG capsule Take 1 capsule (100 mg total) by mouth daily. 05/30/15   Lavera Guise, MD  polyethylene glycol powder (GLYCOLAX/MIRALAX) powder Take 17 g by mouth daily. Dissolve on capful of powder  into any fluid and drink once daily. May increase to two times daily if no effect in 3 days. 05/30/15   Lavera Guise, MD   BP 128/60 mmHg  Pulse 74  Temp(Src) 97.7 F (36.5 C) (Oral)  Resp 18  SpO2 99% Physical Exam Physical Exam  Nursing note and vitals reviewed. Constitutional: elderly, well developed, well nourished, non-toxic, and in no acute distress Head: Normocephalic and atraumatic.  Mouth/Throat: Oropharynx is clear and moist.  Neck: Normal range of motion. Neck supple.  Cardiovascular: Normal rate and regular rhythm.   Pulmonary/Chest: Effort normal and breath sounds normal.  Abdominal:  Soft. distended There is no tenderness. There is no rebound and no guarding.  Rectum: Soft brown stool in rectum without impaction.  Musculoskeletal: Normal range of motion.  Neurological: Alert, no facial droop, fluent speech, moves all extremities symmetrically Skin: Skin is warm and dry.  Psychiatric: Cooperative   ED Course  Procedures (including critical care time) Labs Review Labs Reviewed  COMPREHENSIVE METABOLIC PANEL - Abnormal; Notable for the following:    Glucose, Bld 101 (*)    Albumin 3.2 (*)    Total Bilirubin 0.2 (*)    All other components within normal limits  CBC WITH DIFFERENTIAL/PLATELET  LIPASE, BLOOD  I-STAT CG4 LACTIC ACID, ED  I-STAT CG4 LACTIC ACID, ED    Imaging Review Ct Abdomen Pelvis W Contrast  05/30/2015  CLINICAL DATA:  Abdominal distension, discomfort, constipation for last 5 days, status postcholecystectomy, post hysterectomy, history of GERD EXAM: CT ABDOMEN AND PELVIS WITH CONTRAST TECHNIQUE: Multidetector CT imaging of the abdomen and pelvis was performed using the standard protocol following bolus administration of intravenous contrast. CONTRAST:  OMNIPAQUE IOHEXOL 300 MG/ML  SOLN COMPARISON:  Abdominal x-ray 06/29/2009 FINDINGS: The lung bases are unremarkable. Partially visualized cardiac pacemaker leads in right atrium and right ventricle. Sagittal images of the spine shows mild degenerative changes thoracolumbar spine. There is diffuse osteopenia. There is about 6 mm anterolisthesis L4 on L5 vertebral body. The patient is status postcholecystectomy. There is minimal fatty infiltration of the liver. There is a cyst in inferior posterior aspect of the right hepatic lobe measures 1.5 cm. CBD measures 8.4 mm in diameter probable postcholecystectomy. Enhanced pancreas spleen and adrenal glands are unremarkable. Enhanced kidneys are symmetrical in size. No hydronephrosis or hydroureter. There is a exophytic cyst in upper pole of the left kidney  measures 3.3 cm. Delayed renal images shows bilateral renal symmetrical excretion. 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 There is a small umbilical hernia containing fat without evidence of acute complication. Atherosclerotic calcifications are noted bilateral renal artery origin. Mild atherosclerotic calcification bilateral common iliac artery. There is no evidence of aortic aneurysm. No small bowel obstruction.  No ascites or free air.  No adenopathy. Moderate stool noted in right colon and cecum. There is no pericecal inflammation. The terminal ileum is unremarkable. The appendix is not identified. There is moderate to abundant stool in redundant transverse colon. Abundant stool noted in splenic flexure of the colon. Moderate stool noted in descending colon. Some colonic stool noted in proximal sigmoid colon. There is redundant mid sigmoid colon with some colonic gas. There is no evidence of distal colonic obstruction. Moderate stool noted within rectum. Few colonic diverticula are noted proximal sigmoid colon without evidence of acute diverticulitis. The patient is status post hysterectomy. There is a small right posterior bladder diverticulum measures 1.2 cm. No pelvic ascites or adenopathy. No inguinal adenopathy. IMPRESSION: 1. The patient  is status post cholecystectomy. Minimal fatty infiltration of the liver. There is a cyst in inferior aspect of right hepatic lobe posteriorly measures 1.5 cm. 2. No hydronephrosis. There is a cyst in upper pole of the left kidney measures 3.3 cm. 3. Atherosclerotic calcifications are noted bilateral renal artery origin. Mild atherosclerotic calcifications bilateral common iliac arteries. 4. Small umbilical hernia containing fat without evidence of acute complication. 5. Moderate to abundant stool throughout the colon as described above probable constipation. There is no evidence of acute colitis or diverticulitis. No distal colonic obstruction. 6. No  pericecal inflammation.  The appendix is not identified. 7. 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 8. Status post hysterectomy. 9. There is about 6 mm anterolisthesis L4 on L5 vertebral body. Electronically Signed   By: Natasha Mead M.D.   On: 05/30/2015 12:50   I have personally reviewed and evaluated these images and lab results as part of my medical decision-making.   EKG Interpretation None      MDM   Final diagnoses:  Constipation, unspecified constipation type    82 year old female with history of abdominal hysterectomy and cholecystectomy who presents with abdominal distension and constipation. VS non-concerning on arrival. Has distended abdomen, overall soft and benign without significant tenderness. Unremarkable blood work, including CBC, CMP, lipase, and lactic. CT without obstruction or other acute infectious or surgical process. No fecal impaction on rectal exam. Given enema in the ED with large BM. Will start on miralax and colace. She will follow-up closely with PCP. Strict return and follow-up instructions reviewed. She expressed understanding of all discharge instructions and felt comfortable with the plan of care.     Lavera Guise, MD 05/30/15 [phone]

## 2015-05-31 ENCOUNTER — Telehealth: Payer: Self-pay

## 2015-05-31 NOTE — Telephone Encounter (Signed)
Patients son called to check what patients weight was and to let us know that she has been placed on colace to help move her bowels. But she has not had any results yet.

## 2015-06-01 NOTE — Progress Notes (Signed)
Cardiology Office Note   Date:  06/02/2015   ID:  Tara Hughes, DOB 1925/11/16, MRN [id number]  PCP:  [name], DO  Cardiologist:   Dietrich Pates, MD    F/U of atrial fib      History of Present Illness: Tara Hughes is a 82 y.o. female with a history ofPAF (on Eliquis), HTN, HLD, Tachy-brady syndrome (s/p PPM 08/2004) and GERD who presented to [facility] ED on 04/17/2015 for right sided-chest pain. Her initial EKG showed atrial fibrillation w/ RVR, HR of 127. She was in/out of afib Eventually put on amiodarone before d/c I saw the pt in early Feb   Since seen her breathing has been OK Her son says that her thighs have been swollen  COncerned  No history of fall or injury  Son does say that pt is constipated     Outpatient Prescriptions Prior to Visit  Medication Sig Dispense Refill  . acetaminophen (TYLENOL) 325 MG tablet Take 650 mg by mouth every 6 (six) hours as needed for mild pain.    Marland Kitchen amiodarone (PACERONE) 200 MG tablet Take 1 tablet (200 mg total) by mouth 2 (two) times daily. 60 tablet 1  . apixaban (ELIQUIS) 5 MG TABS tablet Take 1 tablet (5 mg total) by mouth 2 (two) times daily. 60 tablet 12  . diltiazem (CARDIZEM CD) 120 MG 24 hr capsule Take 1 capsule (120 mg total) by mouth daily. 30 capsule 1  . docusate sodium (COLACE) 100 MG capsule Take 1 capsule (100 mg total) by mouth daily. 60 capsule 0  . furosemide (LASIX) 20 MG tablet Take 20-40 mg by mouth daily. Take 20 mg twice daily on Mon / Wed / Fri    . omeprazole (PRILOSEC) 40 MG capsule Take 1 capsule (40 mg total) by mouth daily. 90 capsule 3  . polyethylene glycol powder (GLYCOLAX/MIRALAX) powder Take 17 g by mouth daily. Dissolve on capful of powder into any fluid and drink once daily. May increase to two times daily if no effect in 3 days. 255 g 0  . potassium chloride (K-DUR) 10 MEQ tablet Take 2 tablets (20 mEq total) by mouth 3 (three) times a week. 90 tablet 3   No facility-administered  medications prior to visit.     Allergies:   Review of patient's allergies indicates no known allergies.   Past Medical History  Diagnosis Date  . High blood pressure   . High cholesterol   . PERIPHERAL EDEMA 09/13/2009    Qualifier: Diagnosis of  By: Jonny Ruiz MD, Len Blalock   . Paroxysmal atrial fibrillation (HCC) 09/13/2014  . Pacemaker-Biotronik 2006  . Dementia   . GERD (gastroesophageal reflux disease)   . Arthritis   . Chronic diastolic CHF (congestive heart failure) (HCC)     a. 08/2014: EF 65-70% with Grade 1 DD     Past Surgical History  Procedure Laterality Date  . Abdominal hysterectomy    . Tonsillectomy    . Back operation    . Cholecystectomy    . Insert / replace / remove pacemaker       Social History:  The patient  reports that she has never smoked. She has never used smokeless tobacco. She reports that she does not drink alcohol or use illicit drugs.   Family History:  The patient's family history includes Cancer in her father; Diabetes in her brother; Diverticulitis in her sister; Heart disease in her sister.    ROS:  Please see  the history of present illness. All other systems are reviewed and  Negative to the above problem except as noted.    PHYSICAL EXAM: VS:  BP 124/68 mmHg  Pulse 74  Ht  (1.499 m)  Wt 174 lb (78.926 kg)  BMI 35.13 kg/m2  SpO2 99%  GEN: Morbidly obese 82 yo, in no acute distressExamined in chair   HEENT: normal Neck: no JVD, carotid bruits, or masses Cardiac: RRR; no murmurs, rubs, or gallops,Tr  edema calves  L thigh swollen, knee swollen  Respiratory:  clear to auscultation bilaterally, normal work of breathing GI: soft, nontender, nondistended, + BS  No hepatomegaly  MS: no deformity Moving all extremities   Skin: warm and dry, no rash Neuro:  Strength and sensation are intact Psych: euthymic mood, full affect   EKG:  EKG is not ordered today.   Lipid Panel    Component Value Date/Time   CHOL 174 11/23/2014 1153     CHOL 186 03/08/2009 1034   TRIG 100 11/23/2014 1153   HDL 48 11/23/2014 1153   HDL 52.40 03/08/2009 1034   CHOLHDL 3.6 11/23/2014 1153   CHOLHDL 4 03/08/2009 1034   VLDL 15.8 03/08/2009 1034   LDLCALC 106* 11/23/2014 1153   LDLCALC 118* 03/08/2009 1034      Wt Readings from Last 3 Encounters:  06/02/15 174 lb (78.926 kg)  05/08/15 175 lb (79.379 kg)  04/28/15 176 lb 12.8 oz (80.196 kg)      ASSESSMENT AND PLAN:  1  PAF Appears to be in SR on exam  With constipation I would stop dilt and swtich to toprol XL.Keep other meds   2.  Diastolic CHF  I dont think that her volume status is that bad  No signif edema in calves.  Thighs are swollen  I am not sure it is fluid   WIll check labs.  Keep on same regmmen     Current medicines are reviewed at length with the patient today.  The patient does not have concerns regarding medicines.  The following changes have been made:   Labs/ tests ordered today include: No orders of the defined types were placed in this encounter.     Disposition:   FU with  in   Signed, Dietrich Pates, MD  06/02/2015 9:32 AM    [facility] [address] [place], [place], [place]  [address] Phone: [phone]; Fax: [phone]

## 2015-06-02 ENCOUNTER — Telehealth: Payer: Self-pay | Admitting: *Deleted

## 2015-06-02 ENCOUNTER — Encounter: Payer: Self-pay | Admitting: Internal Medicine

## 2015-06-02 ENCOUNTER — Other Ambulatory Visit: Payer: Self-pay | Admitting: Internal Medicine

## 2015-06-02 ENCOUNTER — Ambulatory Visit (INDEPENDENT_AMBULATORY_CARE_PROVIDER_SITE_OTHER): Payer: Medicare Other | Admitting: Internal Medicine

## 2015-06-02 ENCOUNTER — Ambulatory Visit: Payer: Medicare Other | Admitting: Internal Medicine

## 2015-06-02 ENCOUNTER — Ambulatory Visit (HOSPITAL_COMMUNITY)
Admission: RE | Admit: 2015-06-02 | Discharge: 2015-06-02 | Disposition: A | Discharge status: Home / Self Care | Payer: Medicare Other | Source: Ambulatory Visit | Attending: Internal Medicine | Admitting: Internal Medicine

## 2015-06-02 VITALS — BP 138/88 | HR 74 | Temp 97.6°F | Resp 20

## 2015-06-02 VITALS — BP 124/68 | HR 74 | Ht 59.0 in | Wt 174.0 lb

## 2015-06-02 DIAGNOSIS — M719 Bursopathy, unspecified: Secondary | ICD-10-CM

## 2015-06-02 DIAGNOSIS — M79604 Pain in right leg: Secondary | ICD-10-CM | POA: Diagnosis not present

## 2015-06-02 DIAGNOSIS — K219 Gastro-esophageal reflux disease without esophagitis: Secondary | ICD-10-CM

## 2015-06-02 DIAGNOSIS — I480 Paroxysmal atrial fibrillation: Secondary | ICD-10-CM | POA: Diagnosis not present

## 2015-06-02 DIAGNOSIS — I10 Essential (primary) hypertension: Secondary | ICD-10-CM | POA: Diagnosis not present

## 2015-06-02 DIAGNOSIS — R609 Edema, unspecified: Secondary | ICD-10-CM | POA: Insufficient documentation

## 2015-06-02 DIAGNOSIS — R600 Localized edema: Secondary | ICD-10-CM

## 2015-06-02 DIAGNOSIS — M7989 Other specified soft tissue disorders: Secondary | ICD-10-CM | POA: Diagnosis not present

## 2015-06-02 DIAGNOSIS — M79605 Pain in left leg: Secondary | ICD-10-CM | POA: Insufficient documentation

## 2015-06-02 DIAGNOSIS — M25552 Pain in left hip: Secondary | ICD-10-CM | POA: Diagnosis not present

## 2015-06-02 LAB — BASIC METABOLIC PANEL
BUN: 12 mg/dL (ref 7–25)
CHLORIDE: 101 mmol/L (ref 98–110)
CO2: 24 mmol/L (ref 20–31)
Calcium: 9.3 mg/dL (ref 8.6–10.4)
Creat: 0.8 mg/dL (ref 0.60–0.88)
Glucose, Bld: 124 mg/dL — ABNORMAL HIGH (ref 65–99)
POTASSIUM: 4 mmol/L (ref 3.5–5.3)
SODIUM: 138 mmol/L (ref 135–146)

## 2015-06-02 LAB — BRAIN NATRIURETIC PEPTIDE: Brain Natriuretic Peptide: 9.1 pg/mL (ref <100)

## 2015-06-02 MED ORDER — POTASSIUM CHLORIDE ER 10 MEQ PO TBCR: 20.0000 meq | EXTENDED_RELEASE_TABLET | ORAL | Status: DC

## 2015-06-02 MED ORDER — FUROSEMIDE 20 MG PO TABS: 20.0000 mg | ORAL_TABLET | Freq: Every day | ORAL | Status: DC

## 2015-06-02 MED ORDER — OMEPRAZOLE 40 MG PO CPDR: 40.0000 mg | DELAYED_RELEASE_CAPSULE | Freq: Every day | ORAL | Status: AC

## 2015-06-02 MED ORDER — AMIODARONE HCL 200 MG PO TABS: 200.0000 mg | ORAL_TABLET | Freq: Two times a day (BID) | ORAL | Status: AC

## 2015-06-02 MED ORDER — APIXABAN 5 MG PO TABS: 5.0000 mg | ORAL_TABLET | Freq: Two times a day (BID) | ORAL | Status: AC

## 2015-06-02 MED ORDER — METOPROLOL SUCCINATE ER 25 MG PO TB24: 25.0000 mg | ORAL_TABLET | Freq: Every day | ORAL | Status: DC

## 2015-06-02 NOTE — Progress Notes (Signed)
Patient ID: Tara Hughes, female   DOB: 09/24/1925, 82 y.o.   MRN: [id number]    Location:    [facility]    Place of Service:  OFFICE   Chief Complaint  Patient presents with  . Acute Visit    Patients c/o Has swelling and pain in her left hip down to her knee and right knee has some swelling  also  . OTHER    Son in room with patient    HPI:  82 yo female seen today for thigh swelling x 1 week. She reports SOB with exertion but that is chronic. No CP. No N/V. No f/c. She saw cardio this AM and had labs (BMP and BNP) drawn. cardizem changed to toprol. She is on eliquis for PAF and has a pacemaker. She has a hx edema. She is a poor historian due to dementia. Hx obtained from son  Past Medical History  Diagnosis Date  . High blood pressure   . High cholesterol   . PERIPHERAL EDEMA 09/13/2009    Qualifier: Diagnosis of  By: Jenny Reichmann MD, Hunt Oris   . Paroxysmal atrial fibrillation ([place]) 09/13/2014  . Pacemaker-Biotronik 2006  . Dementia   . GERD (gastroesophageal reflux disease)   . Arthritis   . Chronic diastolic CHF (congestive heart failure) ([place])     a. 08/2014: EF 65-70% with Grade 1 DD     Past Surgical History  Procedure Laterality Date  . Abdominal hysterectomy    . Tonsillectomy    . Back operation    . Cholecystectomy    . Insert / replace / remove pacemaker      Patient Care Team: Gildardo Cranker, DO as PCP - General (Internal Medicine)  Social History   Social History  . Marital Status: Widowed    Spouse Name: N/A  . Number of Children: N/A  . Years of Education: N/A   Occupational History  . Not on file.   Social History Main Topics  . Smoking status: Never Smoker   . Smokeless tobacco: Never Used  . Alcohol Use: No  . Drug Use: No  . Sexual Activity: Not on file   Other Topics Concern  . Not on file   Social History Narrative   Diet:      Do you drink/ eat things with caffeine? Coffee,soda      Marital status: Widow                               What year were you married ?      Do you live in a house, apartment,assistred living, condo, trailer, etc.)?Yes, House      Is it one or more stories? 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                              Type & how often:      Do you have a living will? No      Do you have a DNR form?  No  If not, do you want to discuss one? No      Do you have signed POA?HPOA forms? Yes                If so, please bring to your        appointment           reports that she has never smoked. She has never used smokeless tobacco. She reports that she does not drink alcohol or use illicit drugs.  No Known Allergies  Medications: Patient's Medications  New Prescriptions   No medications on file  Previous Medications   ACETAMINOPHEN (TYLENOL) 325 MG TABLET    Take 650 mg by mouth every 6 (six) hours as needed for mild pain.   AMIODARONE (PACERONE) 200 MG TABLET    Take 1 tablet (200 mg total) by mouth 2 (two) times daily.   APIXABAN (ELIQUIS) 5 MG TABS TABLET    Take 1 tablet (5 mg total) by mouth 2 (two) times daily.   DOCUSATE SODIUM (COLACE) 100 MG CAPSULE    Take 1 capsule (100 mg total) by mouth daily.   FUROSEMIDE (LASIX) 20 MG TABLET    Take 1-2 tablets (20-40 mg total) by mouth daily. Take 20 mg twice daily on Mon / Wed / Fri   METOPROLOL SUCCINATE (TOPROL-XL) 25 MG 24 HR TABLET    Take 1 tablet (25 mg total) by mouth daily.   OMEPRAZOLE (PRILOSEC) 40 MG CAPSULE    Take 1 capsule (40 mg total) by mouth daily.   POLYETHYLENE GLYCOL POWDER (GLYCOLAX/MIRALAX) POWDER    Take 17 g by mouth daily. Dissolve on capful of powder into any fluid and drink once daily. May increase to two times daily if no effect in 3 days.   POTASSIUM CHLORIDE (K-DUR) 10 MEQ TABLET    Take 2 tablets (20 mEq total) by mouth 3 (three) times a week.    Modified Medications   No medications on file  Discontinued Medications   No medications on file    Review of Systems  Unable to perform ROS: Dementia    Filed Vitals:   06/02/15 1040  BP: 138/88  Pulse: 74  Temp: 97.6 F (36.4 C)  TempSrc: Oral  Resp: 20  SpO2: 95%   There is no weight on file to calculate BMI.  Physical Exam  Constitutional: She appears well-developed and well-nourished. No distress.  Sitting in w/c in NAD  Cardiovascular: Normal rate, regular rhythm and intact distal pulses.  Exam reveals no gallop and no friction rub.   Murmur (1/6 SEM) heard. Trace distal LE edema. L>R +1 pitting thigh edema. No calf TTP. No palpable cords  Pulmonary/Chest: Effort normal and breath sounds normal. No respiratory distress. She has no wheezes. She has no rales. She exhibits no tenderness.  Musculoskeletal: She exhibits edema and tenderness.  Neck flexion contracture. Left greater trochanteric TTP with iliotibial band hypertrophy and ropy tissue texture changes. Gait unsteady. No piriformis TTP  Neurological: She is alert.  Skin: Skin is warm and dry. No rash noted.  Psychiatric: She has a normal mood and affect. Her behavior is normal.     Labs reviewed: Admission on 05/30/2015, Discharged on 05/30/2015  Component Date Value Ref Range Status  . WBC 05/30/2015 8.4  4.0 - 10.5 K/uL Final  . RBC 05/30/2015 4.11  3.87 - 5.11 MIL/uL Final  . Hemoglobin 05/30/2015 12.8  12.0 - 15.0 g/dL Final  . HCT 05/30/2015 37.9  36.0 - 46.0 %  Final  . MCV 05/30/2015 92.2  78.0 - 100.0 fL Final  . MCH 05/30/2015 31.1  26.0 - 34.0 pg Final  . MCHC 05/30/2015 33.8  30.0 - 36.0 g/dL Final  . RDW 05/30/2015 15.2  11.5 - 15.5 % Final  . Platelets 05/30/2015 269  150 - 400 K/uL Final  . Neutrophils Relative % 05/30/2015 59   Final  . Lymphocytes Relative 05/30/2015 30   Final  . Monocytes Relative 05/30/2015 8   Final  . Eosinophils Relative 05/30/2015 2   Final  . Basophils Relative  05/30/2015 1   Final  . Neutro Abs 05/30/2015 4.9  1.7 - 7.7 K/uL Final  . Lymphs Abs 05/30/2015 2.5  0.7 - 4.0 K/uL Final  . Monocytes Absolute 05/30/2015 0.7  0.1 - 1.0 K/uL Final  . Eosinophils Absolute 05/30/2015 0.2  0.0 - 0.7 K/uL Final  . Basophils Absolute 05/30/2015 0.1  0.0 - 0.1 K/uL Final  . WBC Morphology 05/30/2015 ATYPICAL LYMPHOCYTES   Final  . Smear Review 05/30/2015 LARGE PLATELETS PRESENT   Final  . Sodium 05/30/2015 140  135 - 145 mmol/L Final  . Potassium 05/30/2015 4.0  3.5 - 5.1 mmol/L Final  . Chloride 05/30/2015 104  101 - 111 mmol/L Final  . CO2 05/30/2015 26  22 - 32 mmol/L Final  . Glucose, Bld 05/30/2015 101* 65 - 99 mg/dL Final  . BUN 05/30/2015 8  6 - 20 mg/dL Final  . Creatinine, Ser 05/30/2015 0.72  0.44 - 1.00 mg/dL Final  . Calcium 05/30/2015 9.1  8.9 - 10.3 mg/dL Final  . Total Protein 05/30/2015 7.7  6.5 - 8.1 g/dL Final  . Albumin 05/30/2015 3.2* 3.5 - 5.0 g/dL Final  . AST 05/30/2015 17  15 - 41 U/L Final  . ALT 05/30/2015 14  14 - 54 U/L Final  . Alkaline Phosphatase 05/30/2015 74  38 - 126 U/L Final  . Total Bilirubin 05/30/2015 0.2* 0.3 - 1.2 mg/dL Final  . GFR calc non Af Amer 05/30/2015 >60  >60 mL/min Final  . GFR calc Af Amer 05/30/2015 >60  >60 mL/min Final   Comment: (NOTE) The eGFR has been calculated using the CKD EPI equation. This calculation has not been validated in all clinical situations. eGFR's persistently <60 mL/min signify possible Chronic Kidney Disease.   . Anion gap 05/30/2015 10  5 - 15 Final  . Lipase 05/30/2015 23  11 - 51 U/L Final  . Lactic Acid, Venous 05/30/2015 1.60  0.5 - 2.0 mmol/L Final  . Lactic Acid, Venous 05/30/2015 1.66  0.5 - 2.0 mmol/L Final  Office Visit on 05/08/2015  Component Date Value Ref Range Status  . TSH 05/08/2015 4.70*  Final   Comment:   Reference Range   > or = 20 Years  0.40-4.50   Pregnancy Range First trimester  0.26-2.66 Second trimester 0.55-2.73 Third trimester   0.43-2.91   ** Please note change in unit of measure and reference range(s). **     . WBC 05/08/2015 7.8  4.0 - 10.5 K/uL Final  . RBC 05/08/2015 4.35  3.87 - 5.11 MIL/uL Final  . Hemoglobin 05/08/2015 13.6  12.0 - 15.0 g/dL Final  . HCT 05/08/2015 39.4  36.0 - 46.0 % Final  . MCV 05/08/2015 90.6  78.0 - 100.0 fL Final  . MCH 05/08/2015 31.3  26.0 - 34.0 pg Final  . MCHC 05/08/2015 34.5  30.0 - 36.0 g/dL Final  . RDW 05/08/2015 14.5  11.5 -  15.5 % Final  . Platelets 05/08/2015 289  150 - 400 K/uL Final  . MPV 05/08/2015 10.7  8.6 - 12.4 fL Final  . Brain Natriuretic Peptide 05/08/2015 11.8  <100 pg/mL Final   Comment:   BNP levels increase with age in the general population with the highest values seen in individuals greater than 24 years of age. Reference: Joellyn Rued Cardiol 2002; 85:631-49.   ** Please note change in reference range(s). **     . Sodium 05/08/2015 137  135 - 146 mmol/L Final  . Potassium 05/08/2015 4.0  3.5 - 5.3 mmol/L Final  . Chloride 05/08/2015 101  98 - 110 mmol/L Final  . CO2 05/08/2015 21  20 - 31 mmol/L Final  . Glucose, Bld 05/08/2015 114* 65 - 99 mg/dL Final  . BUN 05/08/2015 13  7 - 25 mg/dL Final  . Creat 05/08/2015 0.84  0.60 - 0.88 mg/dL Final  . Total Bilirubin 05/08/2015 0.3  0.2 - 1.2 mg/dL Final  . Alkaline Phosphatase 05/08/2015 95  33 - 130 U/L Final  . AST 05/08/2015 15  10 - 35 U/L Final  . ALT 05/08/2015 13  6 - 29 U/L Final  . Total Protein 05/08/2015 8.1  6.1 - 8.1 g/dL Final  . Albumin 05/08/2015 3.9  3.6 - 5.1 g/dL Final  . Calcium 05/08/2015 9.2  8.6 - 10.4 mg/dL Final  Office Visit on 04/28/2015  Component Date Value Ref Range Status  . Glucose 04/28/2015 139* 65 - 99 mg/dL Final  . BUN 04/28/2015 10  8 - 27 mg/dL Final  . Creatinine, Ser 04/28/2015 0.76  0.57 - 1.00 mg/dL Final  . GFR calc non Af Amer 04/28/2015 71  >59 mL/min/1.73 Final  . GFR calc Af Amer 04/28/2015 82  >59 mL/min/1.73 Final  . BUN/Creatinine Ratio  04/28/2015 13  11 - 26 Final  . Sodium 04/28/2015 138  134 - 144 mmol/L Final  . Potassium 04/28/2015 4.1  3.5 - 5.2 mmol/L Final  . Chloride 04/28/2015 100  96 - 106 mmol/L Final  . CO2 04/28/2015 21  18 - 29 mmol/L Final  . Calcium 04/28/2015 9.0  8.7 - 10.3 mg/dL Final  . Magnesium 04/28/2015 2.4* 1.6 - 2.3 mg/dL Final  Admission on 04/17/2015, Discharged on 04/22/2015  Component Date Value Ref Range Status  . Sodium 04/17/2015 142  135 - 145 mmol/L Final  . Potassium 04/17/2015 4.1  3.5 - 5.1 mmol/L Final  . Chloride 04/17/2015 107  101 - 111 mmol/L Final  . CO2 04/17/2015 24  22 - 32 mmol/L Final  . Glucose, Bld 04/17/2015 120* 65 - 99 mg/dL Final  . BUN 04/17/2015 12  6 - 20 mg/dL Final  . Creatinine, Ser 04/17/2015 0.75  0.44 - 1.00 mg/dL Final  . Calcium 04/17/2015 9.1  8.9 - 10.3 mg/dL Final  . GFR calc non Af Amer 04/17/2015 >60  >60 mL/min Final  . GFR calc Af Amer 04/17/2015 >60  >60 mL/min Final   Comment: (NOTE) The eGFR has been calculated using the CKD EPI equation. This calculation has not been validated in all clinical situations. eGFR's persistently <60 mL/min signify possible Chronic Kidney Disease.   . Anion gap 04/17/2015 11  5 - 15 Final  . WBC 04/17/2015 8.7  4.0 - 10.5 K/uL Final  . RBC 04/17/2015 4.00  3.87 - 5.11 MIL/uL Final  . Hemoglobin 04/17/2015 12.0  12.0 - 15.0 g/dL Final  . HCT 04/17/2015 36.7  36.0 -  46.0 % Final  . MCV 04/17/2015 91.8  78.0 - 100.0 fL Final  . MCH 04/17/2015 30.0  26.0 - 34.0 pg Final  . MCHC 04/17/2015 32.7  30.0 - 36.0 g/dL Final  . RDW 12/46/1700 14.9  11.5 - 15.5 % Final  . Platelets 04/17/2015 286  150 - 400 K/uL Final  . Troponin I 04/17/2015 <0.03  <0.031 ng/mL Final   Comment:        NO INDICATION OF MYOCARDIAL INJURY.   . B Natriuretic Peptide 04/17/2015 40.9  0.0 - 100.0 pg/mL Final  . Color, Urine 04/18/2015 YELLOW  YELLOW Final  . APPearance 04/18/2015 CLOUDY* CLEAR Final  . Specific Gravity, Urine 04/18/2015  1.017  1.005 - 1.030 Final  . pH 04/18/2015 5.5  5.0 - 8.0 Final  . Glucose, UA 04/18/2015 NEGATIVE  NEGATIVE mg/dL Final  . Hgb urine dipstick 04/18/2015 TRACE* NEGATIVE Final  . Bilirubin Urine 04/18/2015 NEGATIVE  NEGATIVE Final  . Ketones, ur 04/18/2015 NEGATIVE  NEGATIVE mg/dL Final  . Protein, ur 14/69/1175 NEGATIVE  NEGATIVE mg/dL Final  . Nitrite 54/19/1641 NEGATIVE  NEGATIVE Final  . Leukocytes, UA 04/18/2015 SMALL* NEGATIVE Final  . Troponin I 04/18/2015 <0.03  <0.031 ng/mL Final   Comment:        NO INDICATION OF MYOCARDIAL INJURY.   . Sodium 04/18/2015 142  135 - 145 mmol/L Final  . Potassium 04/18/2015 4.1  3.5 - 5.1 mmol/L Final  . Chloride 04/18/2015 107  101 - 111 mmol/L Final  . CO2 04/18/2015 27  22 - 32 mmol/L Final  . Glucose, Bld 04/18/2015 104* 65 - 99 mg/dL Final  . BUN 35/64/6839 12  6 - 20 mg/dL Final  . Creatinine, Ser 04/18/2015 0.77  0.44 - 1.00 mg/dL Final  . Calcium 72/79/8223 9.0  8.9 - 10.3 mg/dL Final  . GFR calc non Af Amer 04/18/2015 >60  >60 mL/min Final  . GFR calc Af Amer 04/18/2015 >60  >60 mL/min Final   Comment: (NOTE) The eGFR has been calculated using the CKD EPI equation. This calculation has not been validated in all clinical situations. eGFR's persistently <60 mL/min signify possible Chronic Kidney Disease.   . Anion gap 04/18/2015 8  5 - 15 Final  . WBC 04/18/2015 6.7  4.0 - 10.5 K/uL Final  . RBC 04/18/2015 4.08  3.87 - 5.11 MIL/uL Final  . Hemoglobin 04/18/2015 12.4  12.0 - 15.0 g/dL Final  . HCT 31/69/6972 37.6  36.0 - 46.0 % Final  . MCV 04/18/2015 92.2  78.0 - 100.0 fL Final  . MCH 04/18/2015 30.4  26.0 - 34.0 pg Final  . MCHC 04/18/2015 33.0  30.0 - 36.0 g/dL Final  . RDW 96/29/1558 14.7  11.5 - 15.5 % Final  . Platelets 04/18/2015 276  150 - 400 K/uL Final  . Squamous Epithelial / LPF 04/18/2015 6-30* NONE SEEN Final  . WBC, UA 04/18/2015 0-5  0 - 5 WBC/hpf Final  . RBC / HPF 04/18/2015 0-5  0 - 5 RBC/hpf Final  .  Bacteria, UA 04/18/2015 NONE SEEN  NONE SEEN Final  . Sodium 04/20/2015 139  135 - 145 mmol/L Final  . Potassium 04/20/2015 3.9  3.5 - 5.1 mmol/L Final  . Chloride 04/20/2015 105  101 - 111 mmol/L Final  . CO2 04/20/2015 27  22 - 32 mmol/L Final  . Glucose, Bld 04/20/2015 120* 65 - 99 mg/dL Final  . BUN 07/63/5756 18  6 - 20 mg/dL Final  .  Creatinine, Ser 04/20/2015 0.87  0.44 - 1.00 mg/dL Final  . Calcium 04/20/2015 8.8* 8.9 - 10.3 mg/dL Final  . GFR calc non Af Amer 04/20/2015 58* >60 mL/min Final  . GFR calc Af Amer 04/20/2015 >60  >60 mL/min Final   Comment: (NOTE) The eGFR has been calculated using the CKD EPI equation. This calculation has not been validated in all clinical situations. eGFR's persistently <60 mL/min signify possible Chronic Kidney Disease.   . Anion gap 04/20/2015 7  5 - 15 Final  . Sodium 04/21/2015 139  135 - 145 mmol/L Final  . Potassium 04/21/2015 3.9  3.5 - 5.1 mmol/L Final  . Chloride 04/21/2015 101  101 - 111 mmol/L Final  . CO2 04/21/2015 28  22 - 32 mmol/L Final  . Glucose, Bld 04/21/2015 113* 65 - 99 mg/dL Final  . BUN 04/21/2015 21* 6 - 20 mg/dL Final  . Creatinine, Ser 04/21/2015 0.92  0.44 - 1.00 mg/dL Final  . Calcium 04/21/2015 9.0  8.9 - 10.3 mg/dL Final  . GFR calc non Af Amer 04/21/2015 54* >60 mL/min Final  . GFR calc Af Amer 04/21/2015 >60  >60 mL/min Final   Comment: (NOTE) The eGFR has been calculated using the CKD EPI equation. This calculation has not been validated in all clinical situations. eGFR's persistently <60 mL/min signify possible Chronic Kidney Disease.   . Anion gap 04/21/2015 10  5 - 15 Final  . Sodium 04/22/2015 141  135 - 145 mmol/L Final  . Potassium 04/22/2015 4.1  3.5 - 5.1 mmol/L Final  . Chloride 04/22/2015 106  101 - 111 mmol/L Final  . CO2 04/22/2015 27  22 - 32 mmol/L Final  . Glucose, Bld 04/22/2015 105* 65 - 99 mg/dL Final  . BUN 04/22/2015 17  6 - 20 mg/dL Final  . Creatinine, Ser 04/22/2015 0.84  0.44 -  1.00 mg/dL Final  . Calcium 04/22/2015 8.9  8.9 - 10.3 mg/dL Final  . GFR calc non Af Amer 04/22/2015 >60  >60 mL/min Final  . GFR calc Af Amer 04/22/2015 >60  >60 mL/min Final   Comment: (NOTE) The eGFR has been calculated using the CKD EPI equation. This calculation has not been validated in all clinical situations. eGFR's persistently <60 mL/min signify possible Chronic Kidney Disease.   . Anion gap 04/22/2015 8  5 - 15 Final  Clinical Support on 03/14/2015  Component Date Value Ref Range Status  . Pulse Generator Manufacturer 03/14/2015 BITR   Preliminary  . Date Time Interrogation Session 03/14/2015 [id number]   Preliminary  . Pulse Gen Model 03/14/2015 Evia DR-T   Preliminary  . Pulse Gen Serial Number 03/14/2015 [id number]   Preliminary  . Implantable Pulse Generator Type 03/14/2015 Implantable Pulse Generator   Preliminary  . Implantable Pulse Generator Implan* 03/14/2015 [id number]   Preliminary  . Implantable Lead Manufacturer 03/14/2015 SJCR   Preliminary  . Implantable Lead Model 03/14/2015 Unknown   Preliminary  . Implantable Lead Serial Number 03/14/2015 unknown   Preliminary  . Implantable Lead Implant Date 03/14/2015 [id number]   Preliminary  . Implantable Lead Location 03/14/2015 [id number]   Preliminary  . Implantable Lead Manufacturer 03/14/2015 SJCR   Preliminary  . Implantable Lead Model 03/14/2015 Unknown   Preliminary  . Implantable Lead Serial Number 03/14/2015 unknown   Preliminary  . Implantable Lead Implant Date 03/14/2015 [id number]   Preliminary  . Implantable Lead Location 03/14/2015 [id number]   Preliminary  . Lead Channel Setting Sensing Sensi* 03/14/2015 2.5  Preliminary  . Lead Channel Setting Pacing Amplit* 03/14/2015 1.5   Preliminary  . Lead Channel Setting Pacing Pulse * 03/14/2015 0.4   Preliminary  . Lead Channel Setting Pacing Amplit* 03/14/2015 2.5   Preliminary  . Lead Channel Impedance Value 03/14/2015 488   Preliminary  . Lead Channel  Sensing Intrinsic Amp* 03/14/2015 2.4   Preliminary  . Lead Channel Pacing Threshold Ampl* 03/14/2015 0.6   Preliminary  . Lead Channel Pacing Threshold Puls* 03/14/2015 0.4   Preliminary  . Lead Channel Impedance Value 03/14/2015 585   Preliminary  . Lead Channel Sensing Intrinsic Amp* 03/14/2015 14.6   Preliminary  . Battery Status 03/14/2015 OK   Preliminary  . Loletha Grayer Statistic RA Percent Paced 03/14/2015 27   Preliminary  . Loletha Grayer Statistic RV Percent Paced 03/14/2015 0   Preliminary  . Eval Rhythm 03/14/2015 SR   Preliminary    Ct Abdomen Pelvis W Contrast  05/30/2015  CLINICAL DATA:  Abdominal distension, discomfort, constipation for last 5 days, status postcholecystectomy, post hysterectomy, history of GERD EXAM: CT ABDOMEN AND PELVIS WITH CONTRAST TECHNIQUE: Multidetector CT imaging of the abdomen and pelvis was performed using the standard protocol following bolus administration of intravenous contrast. CONTRAST:  122m OMNIPAQUE IOHEXOL 300 MG/ML  SOLN COMPARISON:  Abdominal x-ray 06/29/2009 FINDINGS: The lung bases are unremarkable. Partially visualized cardiac pacemaker leads in right atrium and right ventricle. Sagittal images of the spine shows mild degenerative changes thoracolumbar spine. There is diffuse osteopenia. There is about 6 mm anterolisthesis L4 on L5 vertebral body. The patient is status postcholecystectomy. There is minimal fatty infiltration of the liver. There is a cyst in inferior posterior aspect of the right hepatic lobe measures 1.5 cm. CBD measures 8.4 mm in diameter probable postcholecystectomy. Enhanced pancreas spleen and adrenal glands are unremarkable. Enhanced kidneys are symmetrical in size. No hydronephrosis or hydroureter. There is a exophytic cyst in upper pole of the left kidney measures 3.3 cm. Delayed renal images shows bilateral renal symmetrical excretion. 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 There is a small umbilical hernia containing  fat without evidence of acute complication. Atherosclerotic calcifications are noted bilateral renal artery origin. Mild atherosclerotic calcification bilateral common iliac artery. There is no evidence of aortic aneurysm. No small bowel obstruction.  No ascites or free air.  No adenopathy. Moderate stool noted in right colon and cecum. There is no pericecal inflammation. The terminal ileum is unremarkable. The appendix is not identified. There is moderate to abundant stool in redundant transverse colon. Abundant stool noted in splenic flexure of the colon. Moderate stool noted in descending colon. Some colonic stool noted in proximal sigmoid colon. There is redundant mid sigmoid colon with some colonic gas. There is no evidence of distal colonic obstruction. Moderate stool noted within rectum. Few colonic diverticula are noted proximal sigmoid colon without evidence of acute diverticulitis. The patient is status post hysterectomy. There is a small right posterior bladder diverticulum measures 1.2 cm. No pelvic ascites or adenopathy. No inguinal adenopathy. IMPRESSION: 1. The patient is status post cholecystectomy. Minimal fatty infiltration of the liver. There is a cyst in inferior aspect of right hepatic lobe posteriorly measures 1.5 cm. 2. No hydronephrosis. There is a cyst in upper pole of the left kidney measures 3.3 cm. 3. Atherosclerotic calcifications are noted bilateral renal artery origin. Mild atherosclerotic calcifications bilateral common iliac arteries. 4. Small umbilical hernia containing fat without evidence of acute complication. 5. Moderate to abundant stool throughout the colon as described  above probable constipation. There is no evidence of acute colitis or diverticulitis. No distal colonic obstruction. 6. No pericecal inflammation.  The appendix is not identified. 7. 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 8. Status post hysterectomy. 9. There is about 6 mm anterolisthesis L4  on L5 vertebral body. Electronically Signed   By: Lahoma Crocker M.D.   On: 05/30/2015 12:50     Assessment/Plan   ICD-9-CM ICD-10-CM   1. Swelling of thigh 729.81 M79.89 Ultrasound doppler venous legs bilat   L>R; r/o DVT  2. Paroxysmal atrial fibrillation (HCC) 427.31 I48.0   3. Left hip pain 719.45 M25.552 Ultrasound doppler venous legs bilat   with probable bursitis  4. Essential hypertension, benign 401.1 I10    Will call with [place] results. Further recommendations to follow  Continue current medications as ordered  Follow up as scheduled  Bristyl Mclees S. Perlie Gold  [facility] [address] [place], [place] [address] [phone] Cell (Monday-Friday 8 AM - 5 PM) [phone] After 5 PM and follow prompts

## 2015-06-02 NOTE — Progress Notes (Signed)
VASCULAR LAB PRELIMINARY  PRELIMINARY  PRELIMINARY  PRELIMINARY  Bilateral lower extremity venous duplex completed.    Preliminary report:  Bilateral:  No evidence of DVT, superficial thrombosis, or Baker's Cyst.   Averlee Swartz, RVS 06/02/2015, 3:41 PM

## 2015-06-02 NOTE — Telephone Encounter (Signed)
Noted. Refer to Ortho for left hip pain and probable bursitis

## 2015-06-02 NOTE — Telephone Encounter (Signed)
[facility] lab called and stated that patient's test for DVT came back completely normal. Patient sent home.

## 2015-06-02 NOTE — Patient Instructions (Addendum)
Will call with US results. Further recommendations to follow  Continue current medications as ordered  Follow up as scheduled

## 2015-06-02 NOTE — Patient Instructions (Signed)
Your physician has recommended you make the following change in your medication:  1.) STOP DILTIAZEM 2.) TOPROL XL 25 MG ONCE DAILY  Your physician recommends that you return for lab work in: TODAY (bmet, bnp) .

## 2015-06-05 NOTE — Telephone Encounter (Signed)
Order placed

## 2015-06-06 ENCOUNTER — Other Ambulatory Visit: Payer: Self-pay | Admitting: Internal Medicine

## 2015-06-07 ENCOUNTER — Telehealth: Payer: Self-pay | Admitting: Internal Medicine

## 2015-06-07 NOTE — Telephone Encounter (Signed)
Follow up ° ° ° ° ° °Returned call to the nurse °

## 2015-06-07 NOTE — Telephone Encounter (Signed)
Informed patient of results and verbal understanding expressed.  

## 2015-06-07 NOTE — Telephone Encounter (Signed)
-----   Message from Pricilla RifflePaula Ross V, MD sent at 06/02/2015 11:41 PM EST ----- Electrolytes and kidney function OK Fluid number normal I would keep on same meds

## 2015-06-07 NOTE — Telephone Encounter (Signed)
Returning call,thinks it was about lab results.

## 2015-06-07 NOTE — Telephone Encounter (Signed)
Left message to call back  

## 2015-06-13 ENCOUNTER — Ambulatory Visit (INDEPENDENT_AMBULATORY_CARE_PROVIDER_SITE_OTHER): Payer: Medicare Other | Admitting: *Deleted

## 2015-06-13 DIAGNOSIS — I480 Paroxysmal atrial fibrillation: Secondary | ICD-10-CM

## 2015-06-13 DIAGNOSIS — Z950 Presence of cardiac pacemaker: Secondary | ICD-10-CM

## 2015-06-13 LAB — CUP PACEART REMOTE DEVICE CHECK
Date Time Interrogation Session: 20170609144616
Implantable Lead Implant Date: 20131028
Implantable Lead Location: 753859
Implantable Lead Serial Number: 12345
Lead Channel Pacing Threshold Amplitude: 0.7 V
Lead Channel Pacing Threshold Pulse Width: 0.4 ms
Lead Channel Sensing Intrinsic Amplitude: 14.5 mV
Lead Channel Sensing Intrinsic Amplitude: 2.3 mV
Lead Channel Setting Pacing Pulse Width: 0.4 ms
MDC IDC LEAD IMPLANT DT: 20131028
MDC IDC LEAD LOCATION: 753860
MDC IDC LEAD SERIAL: 12345
MDC IDC MSMT LEADCHNL RA IMPEDANCE VALUE: 468 Ohm
MDC IDC MSMT LEADCHNL RV IMPEDANCE VALUE: 566 Ohm
MDC IDC SET LEADCHNL RA PACING AMPLITUDE: 1.5 V
MDC IDC SET LEADCHNL RV PACING AMPLITUDE: 2.5 V
MDC IDC SET LEADCHNL RV SENSING SENSITIVITY: 2.5 mV
MDC IDC STAT BRADY RA PERCENT PACED: 45 %
MDC IDC STAT BRADY RV PERCENT PACED: 0 %
Pulse Gen Serial Number: 66383162

## 2015-06-13 NOTE — Progress Notes (Signed)
Remote pacemaker transmission.   

## 2015-06-20 ENCOUNTER — Telehealth: Payer: Self-pay

## 2015-06-20 NOTE — Telephone Encounter (Signed)
Patient's son called to report patient with no BM x 3 days. Patient w/o abdominal pain. Patient denies fever.  Last OV 06/02/15, pending OV 06/28/15. Pharmacy on file confirmed. Please advise

## 2015-06-20 NOTE — Telephone Encounter (Signed)
Per Dr. Gean Quintarter--Ducolax Supp. As Directed on box. Continue with Miralax and Colace. Patient son notified and agreed.

## 2015-06-23 ENCOUNTER — Emergency Department (HOSPITAL_COMMUNITY): Payer: Medicare Other

## 2015-06-23 ENCOUNTER — Emergency Department (HOSPITAL_COMMUNITY)
Admission: EM | Admit: 2015-06-23 | Discharge: 2015-06-24 | Disposition: A | Discharge status: Home / Self Care | Payer: Medicare Other | Attending: Emergency Medicine | Admitting: Emergency Medicine

## 2015-06-23 ENCOUNTER — Encounter (HOSPITAL_COMMUNITY): Payer: Self-pay | Admitting: Emergency Medicine

## 2015-06-23 DIAGNOSIS — Y9389 Activity, other specified: Secondary | ICD-10-CM | POA: Diagnosis not present

## 2015-06-23 DIAGNOSIS — S3992XA Unspecified injury of lower back, initial encounter: Secondary | ICD-10-CM | POA: Insufficient documentation

## 2015-06-23 DIAGNOSIS — Z79899 Other long term (current) drug therapy: Secondary | ICD-10-CM | POA: Diagnosis not present

## 2015-06-23 DIAGNOSIS — W19XXXA Unspecified fall, initial encounter: Secondary | ICD-10-CM

## 2015-06-23 DIAGNOSIS — Y998 Other external cause status: Secondary | ICD-10-CM | POA: Insufficient documentation

## 2015-06-23 DIAGNOSIS — I5032 Chronic diastolic (congestive) heart failure: Secondary | ICD-10-CM | POA: Diagnosis not present

## 2015-06-23 DIAGNOSIS — Z7901 Long term (current) use of anticoagulants: Secondary | ICD-10-CM | POA: Diagnosis not present

## 2015-06-23 DIAGNOSIS — K219 Gastro-esophageal reflux disease without esophagitis: Secondary | ICD-10-CM | POA: Insufficient documentation

## 2015-06-23 DIAGNOSIS — Z8639 Personal history of other endocrine, nutritional and metabolic disease: Secondary | ICD-10-CM | POA: Insufficient documentation

## 2015-06-23 DIAGNOSIS — S199XXA Unspecified injury of neck, initial encounter: Secondary | ICD-10-CM | POA: Diagnosis not present

## 2015-06-23 DIAGNOSIS — M40202 Unspecified kyphosis, cervical region: Secondary | ICD-10-CM | POA: Insufficient documentation

## 2015-06-23 DIAGNOSIS — F0390 Unspecified dementia without behavioral disturbance: Secondary | ICD-10-CM | POA: Insufficient documentation

## 2015-06-23 DIAGNOSIS — I480 Paroxysmal atrial fibrillation: Secondary | ICD-10-CM | POA: Insufficient documentation

## 2015-06-23 DIAGNOSIS — Y9289 Other specified places as the place of occurrence of the external cause: Secondary | ICD-10-CM | POA: Diagnosis not present

## 2015-06-23 DIAGNOSIS — M1990 Unspecified osteoarthritis, unspecified site: Secondary | ICD-10-CM | POA: Diagnosis not present

## 2015-06-23 DIAGNOSIS — Z950 Presence of cardiac pacemaker: Secondary | ICD-10-CM | POA: Insufficient documentation

## 2015-06-23 DIAGNOSIS — W1839XA Other fall on same level, initial encounter: Secondary | ICD-10-CM | POA: Insufficient documentation

## 2015-06-23 DIAGNOSIS — R531 Weakness: Secondary | ICD-10-CM | POA: Diagnosis present

## 2015-06-23 LAB — CBC
HCT: 36.7 % (ref 36.0–46.0)
Hemoglobin: 12.2 g/dL (ref 12.0–15.0)
MCH: 29.9 pg (ref 26.0–34.0)
MCHC: 33.2 g/dL (ref 30.0–36.0)
MCV: 90 fL (ref 78.0–100.0)
PLATELETS: 246 10*3/uL (ref 150–400)
RBC: 4.08 MIL/uL (ref 3.87–5.11)
RDW: 14.7 % (ref 11.5–15.5)
WBC: 7.6 10*3/uL (ref 4.0–10.5)

## 2015-06-23 LAB — BASIC METABOLIC PANEL
Anion gap: 10 (ref 5–15)
BUN: 15 mg/dL (ref 6–20)
CALCIUM: 8.7 mg/dL — AB (ref 8.9–10.3)
CO2: 24 mmol/L (ref 22–32)
CREATININE: 0.82 mg/dL (ref 0.44–1.00)
Chloride: 105 mmol/L (ref 101–111)
GFR calc non Af Amer: >60 mL/min (ref >60)
Glucose, Bld: 123 mg/dL — ABNORMAL HIGH (ref 65–99)
Potassium: 3.7 mmol/L (ref 3.5–5.1)
Sodium: 139 mmol/L (ref 135–145)

## 2015-06-23 NOTE — ED Notes (Signed)
Dr. Pickering at the bedside.  

## 2015-06-23 NOTE — ED Notes (Signed)
Per EMS, the patient had witnessed fall while using walker. Fell backwards from weakness in legs. No loc. Complains of back and shoulder pain that has resolved. Patient takes eliquis, afilb, previous stroke. BP w 159/88, pacer at 74. Patient comes from home.

## 2015-06-23 NOTE — ED Provider Notes (Signed)
CSN: [id number]     Arrival date & time 06/23/15  2016 History   First MD Initiated Contact with Patient 06/23/15 2022     Chief Complaint  Patient presents with  . Fall  . Weakness     Patient is a 82 y.o. female presenting with fall and weakness. The history is provided by the patient.  Fall Pertinent negatives include no chest pain, no abdominal pain and no shortness of breath.  Weakness Pertinent negatives include no chest pain, no abdominal pain and no shortness of breath.  Patient presents after fall. Reportedly has been weaker than normal. Fell backwards while using a walker. Complaining of pain in her neck and back. Somewhat poor historian and has a history of dementia. Denies dysuria. Denies fever or chills. Denies cough. Denies headache. Denies confusion. Patient is on Eliquis for atrial fibrillation.  Past Medical History  Diagnosis Date  . High blood pressure   . High cholesterol   . PERIPHERAL EDEMA 09/13/2009    Qualifier: Diagnosis of  By: Jonny Ruiz MD, Len Blalock   . Paroxysmal atrial fibrillation (HCC) 09/13/2014  . Pacemaker-Biotronik 2006  . Dementia   . GERD (gastroesophageal reflux disease)   . Arthritis   . Chronic diastolic CHF (congestive heart failure) (HCC)     a. 08/2014: EF 65-70% with Grade 1 DD    Past Surgical History  Procedure Laterality Date  . Abdominal hysterectomy    . Tonsillectomy    . Back operation    . Cholecystectomy    . Insert / replace / remove pacemaker     Family History  Problem Relation Age of Onset  . Cancer Father   . Diabetes Brother   . Diverticulitis Sister   . Heart disease Sister    Social History  Substance Use Topics  . Smoking status: Never Smoker   . Smokeless tobacco: Never Used  . Alcohol Use: No   OB History    No data available     Review of Systems  Constitutional: Negative for fever and appetite change.  Respiratory: Negative for shortness of breath.   Cardiovascular: Negative for chest pain.   Gastrointestinal: Negative for abdominal pain.  Genitourinary: Negative for flank pain.  Musculoskeletal: Positive for back pain and neck pain.  Neurological: Positive for weakness.      Allergies  Review of patient's allergies indicates no known allergies.  Home Medications   Prior to Admission medications   Medication Sig Start Date End Date Taking? Authorizing Provider  acetaminophen (TYLENOL) 500 MG tablet Take 1,000 mg by mouth every 6 (six) hours as needed for moderate pain.   Yes Historical Provider, MD  amiodarone (PACERONE) 200 MG tablet Take 1 tablet (200 mg total) by mouth 2 (two) times daily. 06/02/15  Yes Pricilla Riffle, MD  apixaban (ELIQUIS) 5 MG TABS tablet Take 1 tablet (5 mg total) by mouth 2 (two) times daily. 06/02/15  Yes Pricilla Riffle, MD  docusate sodium (COLACE) 100 MG capsule Take 1 capsule (100 mg total) by mouth daily. 05/30/15  Yes Lavera Guise, MD  furosemide (LASIX) 20 MG tablet Take 1-2 tablets (20-40 mg total) by mouth daily. Take 20 mg twice daily on Mon / Wed / Fri 06/02/15  Yes Pricilla Riffle, MD  metoprolol succinate (TOPROL-XL) 25 MG 24 hr tablet Take 1 tablet (25 mg total) by mouth daily. 06/02/15  Yes Pricilla Riffle, MD  omeprazole (PRILOSEC) 40 MG capsule Take 1 capsule (40 mg total)  by mouth daily. 06/02/15  Yes Pricilla RifflePaula Ross V, MD  polyethylene glycol powder (GLYCOLAX/MIRALAX) powder Take 17 g by mouth daily. Dissolve on capful of powder into any fluid and drink once daily. May increase to two times daily if no effect in 3 days. 05/30/15  Yes Lavera Guiseana Duo Liu, MD  potassium chloride (K-DUR) 10 MEQ tablet Take 2 tablets (20 mEq total) by mouth 3 (three) times a week. 06/02/15  Yes Pricilla RifflePaula Ross V, MD   BP 134/80 mmHg  Pulse 78  Temp(Src) 98.4 F (36.9 C) (Oral)  Resp 15  Ht 4\' 11"  (1.499 m)  Wt 175 lb (79.379 kg)  BMI 35.33 kg/m2  SpO2 100% Physical Exam  Constitutional: She appears well-developed.  HENT:  Head: Atraumatic.  Eyes: EOM are normal.  Neck:  Patient  has kyphosis of her neck with chronic limited range of motion.  Cardiovascular: Normal rate.   Pulmonary/Chest: Effort normal.  Pacemaker to left anterior chest wall.  Abdominal: There is no tenderness.  Musculoskeletal: She exhibits edema.  edema to bilateral lower extremities.  Neurological: She is alert.  Skin: Skin is warm.    ED Course  Procedures (including critical care time) Labs Review Labs Reviewed  CBC  BASIC METABOLIC PANEL  URINALYSIS, ROUTINE W REFLEX MICROSCOPIC (NOT AT [facility])  CBG MONITORING, ED    Imaging Review Dg Chest 1 View  06/23/2015  CLINICAL DATA:  82 year old female with fall EXAM: CHEST 1 VIEW COMPARISON:  Chest radiograph dated 04/17/2015 FINDINGS: Single-view of the chest demonstrates stable cardiomegaly. There is silhouetting of the left hemidiaphragm likely related to cardiomegaly. Left lung base consolidative changes is not excluded. The right lung is clear. There is no pneumothorax. There is degenerative changes of the spine and shoulders. No definite acute osseous pathology identified. IMPRESSION: No definite acute/ traumatic intrathoracic pathology identified. Stable cardiomegaly. Electronically Signed   By: Elgie CollardArash  Radparvar M.D.   On: 06/23/2015 21:55   Dg Thoracic Spine 2 View  06/23/2015  CLINICAL DATA:  Mid to low back pain after a fall today. EXAM: THORACIC SPINE 2 VIEWS COMPARISON:  Two-view chest 04/17/2015.  CT chest 01/26/2009 FINDINGS: Examination is technically limited due to diffuse bone demineralization. Normal alignment of the thoracic spine. Diffuse degenerative changes with narrowed interspaces and endplate hypertrophic changes. No vertebral compression deformities identified. No paraspinal soft tissue swelling. IMPRESSION: Degenerative changes throughout the thoracic spine. No acute displaced fractures identified. Electronically Signed   By: Burman NievesWilliam  Stevens M.D.   On: 06/23/2015 21:55   Dg Lumbar Spine Complete  06/23/2015  CLINICAL  DATA:  82 year old female with fall and low back pain. EXAM: LUMBAR SPINE - COMPLETE 4+ VIEW COMPARISON:  CT dated 05/30/2015 FINDINGS: There is no acute fracture or subluxation of the lumbar spine. There is osteopenia with multilevel degenerative changes. Stable appearing grade 1 L4-L5 anterolisthesis. The vertebral body heights are maintained. All stop the visualized transverse and spinous processes appear intact. Right upper quadrant cholecystectomy clips noted. Moderate stool throughout the colon. IMPRESSION: No acute/traumatic lumbar spine pathology. Electronically Signed   By: Elgie CollardArash  Radparvar M.D.   On: 06/23/2015 21:54   Ct Head Wo Contrast  06/23/2015  CLINICAL DATA:  Fall hitting the back of the head. Patient on Eliquis. Complaining of headache. EXAM: CT HEAD WITHOUT CONTRAST CT CERVICAL SPINE WITHOUT CONTRAST TECHNIQUE: Multidetector CT imaging of the head and cervical spine was performed following the standard protocol without intravenous contrast. Multiplanar CT image reconstructions of the cervical spine were also generated. COMPARISON:  None. FINDINGS: CT HEAD FINDINGS The ventricles are normal in size, for this patient's age, and normal in configuration. There are no parenchymal masses or mass effect. There is no evidence of a cortical infarct. Mild patchy white matter hypoattenuation is noted consistent with chronic microvascular ischemic change. There are no extra-axial masses or abnormal fluid collections. There is no intracranial hemorrhage. Visualized sinuses and mastoid air cells are clear. No skull fracture. CT CERVICAL SPINE FINDINGS No fracture. No spondylolisthesis. There is a kyphosis, apex at C5. Mild loss of disc height at C3-C4-C4-C5 and C6-C7. Moderate to marked loss of disc height at C5-C6. Bones are demineralized. There are facet degenerative changes most evident on the left from C2-C3 through C4-C5. Soft tissues are unremarkable. There is a focal peribronchovascular airspace  opacity in the left upper lobe. This measures approximate 12 mm in greatest transverse dimension. It may reflect scarring or could be due to active inflammation. IMPRESSION: HEAD CT: No acute intracranial abnormalities. No intracranial hemorrhage. No skull fracture. CERVICAL CT: No fracture or acute bony abnormality. Small area of peribronchovascular airspace opacity in the left upper lobe. This could reflect chronic scarring. Infection or inflammation could have this appearance. Electronically Signed   By: Amie Portland M.D.   On: 06/23/2015 21:50   Ct Cervical Spine Wo Contrast  06/23/2015  CLINICAL DATA:  Fall hitting the back of the head. Patient on Eliquis. Complaining of headache. EXAM: CT HEAD WITHOUT CONTRAST CT CERVICAL SPINE WITHOUT CONTRAST TECHNIQUE: Multidetector CT imaging of the head and cervical spine was performed following the standard protocol without intravenous contrast. Multiplanar CT image reconstructions of the cervical spine were also generated. COMPARISON:  None. FINDINGS: CT HEAD FINDINGS The ventricles are normal in size, for this patient's age, and normal in configuration. There are no parenchymal masses or mass effect. There is no evidence of a cortical infarct. Mild patchy white matter hypoattenuation is noted consistent with chronic microvascular ischemic change. There are no extra-axial masses or abnormal fluid collections. There is no intracranial hemorrhage. Visualized sinuses and mastoid air cells are clear. No skull fracture. CT CERVICAL SPINE FINDINGS No fracture. No spondylolisthesis. There is a kyphosis, apex at C5. Mild loss of disc height at C3-C4-C4-C5 and C6-C7. Moderate to marked loss of disc height at C5-C6. Bones are demineralized. There are facet degenerative changes most evident on the left from C2-C3 through C4-C5. Soft tissues are unremarkable. There is a focal peribronchovascular airspace opacity in the left upper lobe. This measures approximate 12 mm in  greatest transverse dimension. It may reflect scarring or could be due to active inflammation. IMPRESSION: HEAD CT: No acute intracranial abnormalities. No intracranial hemorrhage. No skull fracture. CERVICAL CT: No fracture or acute bony abnormality. Small area of peribronchovascular airspace opacity in the left upper lobe. This could reflect chronic scarring. Infection or inflammation could have this appearance. Electronically Signed   By: Amie Portland M.D.   On: 06/23/2015 21:50   I have personally reviewed and evaluated these images and lab results as part of my medical decision-making.   EKG Interpretation None      MDM   Final diagnoses:  Fall    Patient with fall. Neck pain. Back pain. Imaging reassuring. X-ray did not show pneumonia. Possible peribronchiolar opacity in left upper lobe. May be scarring. Has not had cough or fevers or chills. Lab work pending. Likely will be able to discharge home if reassuring lab work and urine.    Benjiman Core, MD 06/23/15 [phone]

## 2015-06-23 NOTE — ED Notes (Signed)
Phlebotomy at the bedside  

## 2015-06-24 DIAGNOSIS — F0390 Unspecified dementia without behavioral disturbance: Secondary | ICD-10-CM | POA: Diagnosis not present

## 2015-06-24 LAB — URINE MICROSCOPIC-ADD ON

## 2015-06-24 LAB — URINALYSIS, ROUTINE W REFLEX MICROSCOPIC
Glucose, UA: NEGATIVE mg/dL
Ketones, ur: NEGATIVE mg/dL
LEUKOCYTES UA: NEGATIVE
NITRITE: NEGATIVE
PROTEIN: 30 mg/dL — AB
Specific Gravity, Urine: 1.023 (ref 1.005–1.030)
pH: 6 (ref 5.0–8.0)

## 2015-06-24 NOTE — Discharge Instructions (Signed)
Follow-up with her primary care doctor as needed. Dementia Dementia is a general term for problems with brain function. A person with dementia has memory loss and a hard time with at least one other brain function such as thinking, speaking, or problem solving. Dementia can affect social functioning, how you do your job, your mood, or your personality. The changes may be hidden for a long time. The earliest forms of this disease are usually not detected by family or friends. Dementia can be:  Irreversible.  Potentially reversible.  Partially reversible.  Progressive. This means it can get worse over time. CAUSES  Irreversible dementia causes may include:  Degeneration of brain cells (Alzheimer disease or Lewy body dementia).  Multiple small strokes (vascular dementia).  Infection (chronic meningitis or Creutzfeldt-Jakob disease).  Frontotemporal dementia. This affects younger people, age 54 to 75, compared to those who have Alzheimer disease.  Dementia associated with other disorders like Parkinson disease, Huntington disease, or HIV-associated dementia. Potentially or partially reversible dementia causes may include:  Medicines.  Metabolic causes such as excessive alcohol intake, vitamin B12 deficiency, or thyroid disease.  Masses or pressure in the brain such as a tumor, blood clot, or hydrocephalus. SIGNS AND SYMPTOMS  Symptoms are often hard to detect. Family members or coworkers may not notice them early in the disease process. Different people with dementia may have different symptoms. Symptoms can include:  A hard time with memory, especially recent memory. Long-term memory may not be impaired.  Asking the same question multiple times or forgetting something someone just said.  A hard time speaking your thoughts or finding certain words.  A hard time solving problems or performing familiar tasks (such as how to use a telephone).  Sudden changes in mood.  Changes in  personality, especially increasing moodiness or mistrust.  Depression.  A hard time understanding complex ideas that were never a problem in the past. DIAGNOSIS  There are no specific tests for dementia.   Your health care provider may recommend a thorough evaluation. This is because some forms of dementia can be reversible. The evaluation will likely include a physical exam and getting a detailed history from you and a family member. The history often gives the best clues and suggestions for a diagnosis.  Memory testing may be done. A detailed brain function evaluation called neuropsychologic testing may be helpful.  Lab tests and brain imaging (such as a CT scan or MRI scan) are sometimes important.  Sometimes observation and re-evaluation over time is very helpful. TREATMENT  Treatment depends on the cause.   If the problem is a vitamin deficiency, it may be helped or cured with supplements.  For dementias such as Alzheimer disease, medicines are available to stabilize or slow the course of the disease. There are no cures for this type of dementia.  Your health care provider can help direct you to groups, organizations, and other health care providers to help with decisions in the care of you or your loved one. HOME CARE INSTRUCTIONS The care of individuals with dementia is varied and dependent upon the progression of the dementia. The following suggestions are intended for the person living with, or caring for, the person with dementia.  Create a safe environment.  Remove the locks on bathroom doors to prevent the person from accidentally locking himself or herself in.  Use childproof latches on kitchen cabinets and any place where cleaning supplies, chemicals, or alcohol are kept.  Use childproof covers in unused electrical outlets.  Install childproof devices to keep doors and windows secured.  Remove stove knobs or install safety knobs and an automatic shut-off on the  stove.  Lower the temperature on water heaters.  Label medicines and keep them locked up.  Secure knives, lighters, matches, power tools, and guns, and keep these items out of reach.  Keep the house free from clutter. Remove rugs or anything that might contribute to a fall.  Remove objects that might break and hurt the person.  Make sure lighting is good, both inside and outside.  Install grab rails as needed.  Use a monitoring device to alert you to falls or other needs for help.  Reduce confusion.  Keep familiar objects and people around.  Use night lights or dim lights at night.  Label items or areas.  Use reminders, notes, or directions for daily activities or tasks.  Keep a simple, consistent routine for waking, meals, bathing, dressing, and bedtime.  Create a calm, quiet environment.  Place large clocks and calendars prominently.  Display emergency numbers and home address near all telephones.  Use cues to establish different times of the day. An example is to open curtains to let the natural light in during the day.   Use effective communication.  Choose simple words and short sentences.  Use a gentle, calm tone of voice.  Be careful not to interrupt.  If the person is struggling to find a word or communicate a thought, try to provide the word or thought.  Ask one question at a time. Allow the person ample time to answer questions. Repeat the question again if the person does not respond.  Reduce nighttime restlessness.  Provide a comfortable bed.  Have a consistent nighttime routine.  Ensure a regular walking or physical activity schedule. Involve the person in daily activities as much as possible.  Limit napping during the day.  Limit caffeine.  Attend social events that stimulate rather than overwhelm the senses.  Encourage good nutrition and hydration.  Reduce distractions during meal times and snacks.  Avoid foods that are too hot or  too cold.  Monitor chewing and swallowing ability.  Continue with routine vision, hearing, dental, and medical screenings.  Give medicines only as directed by the health care provider.  Monitor driving abilities. Do not allow the person to drive when safe driving is no longer possible.  Register with an identification program which could provide location assistance in the event of a missing person situation. SEEK MEDICAL CARE IF:   New behavioral problems start such as moodiness, aggressiveness, or seeing things that are not there (hallucinations).  Any new problem with brain function happens. This includes problems with balance, speech, or falling a lot.  Problems with swallowing develop.  Any symptoms of other illness happen. Small changes or worsening in any aspect of brain function can be a sign that the illness is getting worse. It can also be a sign of another medical illness such as infection. Seeing a health care provider right away is important. SEEK IMMEDIATE MEDICAL CARE IF:   A fever develops.  New or worsened confusion develops.  New or worsened sleepiness develops.  Staying awake becomes hard to do.   This information is not intended to replace advice given to you by your health care provider. Make sure you discuss any questions you have with your health care provider.   Document Released: 09/11/2000 Document Revised: 04/08/2014 Document Reviewed: 08/13/2010 Elsevier Interactive Patient Education 2016 [employer]   [employer] FinancialHead  Injury, Adult You have received a head injury. It does not appear serious at this time. Headaches and vomiting are common following head injury. It should be easy to awaken from sleeping. Sometimes it is necessary for you to stay in the emergency department for a while for observation. Sometimes admission to the hospital may be needed. After injuries such as yours, most problems occur within the first 24 hours, but side effects may occur up to  7-10 days after the injury. It is important for you to carefully monitor your condition and contact your health care provider or seek immediate medical care if there is a change in your condition. WHAT ARE THE TYPES OF HEAD INJURIES? Head injuries can be as minor as a bump. Some head injuries can be more severe. More severe head injuries include:  A jarring injury to the brain (concussion).  A bruise of the brain (contusion). This mean there is bleeding in the brain that can cause swelling.  A cracked skull (skull fracture).  Bleeding in the brain that collects, clots, and forms a bump (hematoma). WHAT CAUSES A HEAD INJURY? A serious head injury is most likely to happen to someone who is in a car wreck and is not wearing a seat belt. Other causes of major head injuries include bicycle or motorcycle accidents, sports injuries, and falls. HOW ARE HEAD INJURIES DIAGNOSED? A complete history of the event leading to the injury and your current symptoms will be helpful in diagnosing head injuries. Many times, pictures of the brain, such as CT or MRI are needed to see the extent of the injury. Often, an overnight hospital stay is necessary for observation.  WHEN SHOULD I SEEK IMMEDIATE MEDICAL CARE?  You should get help right away if:  You have confusion or drowsiness.  You feel sick to your stomach (nauseous) or have continued, forceful vomiting.  You have dizziness or unsteadiness that is getting worse.  You have severe, continued headaches not relieved by medicine. Only take over-the-counter or prescription medicines for pain, fever, or discomfort as directed by your health care provider.  You do not have normal function of the arms or legs or are unable to walk.  You notice changes in the black spots in the center of the colored part of your eye (pupil).  You have a clear or bloody fluid coming from your nose or ears.  You have a loss of vision. During the next 24 hours after the  injury, you must stay with someone who can watch you for the warning signs. This person should contact local emergency services (911 in the [place] if you have seizures, you become unconscious, or you are unable to wake up. HOW CAN I PREVENT A HEAD INJURY IN THE FUTURE? The most important factor for preventing major head injuries is avoiding motor vehicle accidents. To minimize the potential for damage to your head, it is crucial to wear seat belts while riding in motor vehicles. Wearing helmets while bike riding and playing collision sports (like football) is also helpful. Also, avoiding dangerous activities around the house will further help reduce your risk of head injury.  WHEN CAN I RETURN TO NORMAL ACTIVITIES AND ATHLETICS? You should be reevaluated by your health care provider before returning to these activities. If you have any of the following symptoms, you should not return to activities or contact sports until 1 week after the symptoms have stopped:  Persistent headache.  Dizziness or vertigo.  Poor attention and concentration.  Confusion.  Memory problems.  Nausea or vomiting.  Fatigue or tire easily.  Irritability.  Intolerant of bright lights or loud noises.  Anxiety or depression.  Disturbed sleep. MAKE SURE YOU:   Understand these instructions.  Will watch your condition.  Will get help right away if you are not doing well or get worse.   This information is not intended to replace advice given to you by your health care provider. Make sure you discuss any questions you have with your health care provider.   Document Released: 03/18/2005 Document Revised: 04/08/2014 Document Reviewed: 11/23/2012 Elsevier Interactive Patient Education [employer]

## 2015-06-24 NOTE — ED Provider Notes (Signed)
1:20 AM  Assumed care from Dr. Rubin PayorPickering.  Pt is a 82 y.o. female with history of dementia who presents to the emergency department with generalized weakness and a fall. Had a fall backwards while using her walker. Imaging shows no acute injury. Labs unremarkable. Urine shows blood but she was in and out cath. No other sign of infection. Family reports she is at her baseline. Hemodynamically stable. They're comfortable with plan for discharge home. She has a primary care for follow-up. Discussed return precautions. They verbalize understanding and are comfortable with this plan.  Layla MawKristen N Tambria Pfannenstiel, DO 06/24/15 [phone]

## 2015-06-24 NOTE — ED Notes (Signed)
Patient's visitor in hallway, demanding patient be discharged now. "yal gonna make me wait another hour just to leave?". I explained currently the staff they see do not work at this hospital, but they are EMS giving report for another patient. "Just unhook her so she can get dressed."  I left EMS report, unhooked patient, and provided discharge paperwork to family. Stated they were free to leave.

## 2015-06-24 NOTE — ED Notes (Signed)
Dr. Ward at the bedside.  

## 2015-06-25 LAB — URINE CULTURE: Culture: NO GROWTH

## 2015-06-26 ENCOUNTER — Ambulatory Visit: Payer: Medicare Other | Admitting: Internal Medicine

## 2015-06-28 ENCOUNTER — Telehealth: Payer: Self-pay

## 2015-06-28 ENCOUNTER — Ambulatory Visit: Payer: Medicare Other | Admitting: Internal Medicine

## 2015-06-28 NOTE — Telephone Encounter (Signed)
She really needs to be seen before proper pain mx can be determined

## 2015-06-28 NOTE — Telephone Encounter (Signed)
Patients son called this morning to ask advise on what he should do to help his mother. She had a fall recently and he had her seen at ER, they said nothing was broken but he says now he can not move her because of her pain. She had  an appointment  this morning but he could not get her up or move her said. Please Advise

## 2015-06-29 NOTE — Telephone Encounter (Signed)
Called spoke with patient son, I told him his mother needed to be seen before the doctor could determine what sort of pain medication if any should be given. He agreed to this and I made an appointment for her for tomorrow.

## 2015-06-30 ENCOUNTER — Encounter: Payer: Self-pay | Admitting: Internal Medicine

## 2015-06-30 ENCOUNTER — Ambulatory Visit (INDEPENDENT_AMBULATORY_CARE_PROVIDER_SITE_OTHER): Payer: Medicare Other | Admitting: Internal Medicine

## 2015-06-30 VITALS — BP 116/80 | HR 66 | Temp 97.8°F | Resp 20

## 2015-06-30 DIAGNOSIS — I10 Essential (primary) hypertension: Secondary | ICD-10-CM | POA: Diagnosis not present

## 2015-06-30 DIAGNOSIS — Z23 Encounter for immunization: Secondary | ICD-10-CM | POA: Diagnosis not present

## 2015-06-30 DIAGNOSIS — I480 Paroxysmal atrial fibrillation: Secondary | ICD-10-CM

## 2015-06-30 DIAGNOSIS — M25552 Pain in left hip: Secondary | ICD-10-CM

## 2015-06-30 DIAGNOSIS — R609 Edema, unspecified: Secondary | ICD-10-CM | POA: Diagnosis not present

## 2015-06-30 DIAGNOSIS — M150 Primary generalized (osteo)arthritis: Secondary | ICD-10-CM

## 2015-06-30 DIAGNOSIS — M159 Polyosteoarthritis, unspecified: Secondary | ICD-10-CM

## 2015-06-30 DIAGNOSIS — K219 Gastro-esophageal reflux disease without esophagitis: Secondary | ICD-10-CM

## 2015-06-30 DIAGNOSIS — M8949 Other hypertrophic osteoarthropathy, multiple sites: Secondary | ICD-10-CM

## 2015-06-30 MED ORDER — TRAMADOL HCL 50 MG PO TABS: 50.0000 mg | ORAL_TABLET | Freq: Three times a day (TID) | ORAL | Status: DC | PRN

## 2015-06-30 MED ORDER — ZOSTER VACCINE LIVE 19400 UNT/0.65ML ~~LOC~~ SOLR: 0.6500 mL | Freq: Once | SUBCUTANEOUS | Status: DC

## 2015-06-30 NOTE — Patient Instructions (Signed)
May take tramadol as needed for pain up to 3 times daily  Continue other medications as ordered  Follow up in 1 month for routine visit

## 2015-06-30 NOTE — Progress Notes (Signed)
Patient ID: Tara Hughes, female   DOB: 1925-12-24, 82 y.o.   MRN: [id number]    Location:    [facility]   Place of Service:   OFFICE  Chief Complaint  Patient presents with  . Acute Visit    Patient c/o Patient had a fall last week has been having pain in hips and having headaches  . OTHER    Son in room with patient    HPI:  82 yo female seen today for f/u. She fell last week at home and hit her head on the floor. She went to the ED. CT head neg for acute process. xrays revealed arthritic changes in thoracic spine but no acute fx. She c/o occipital HA today. She has dementia and is a poor historian. Hx obtained from chart and son  Edema - fluctuates. She takes lasix daily along Potassium supplement  HTN - BP stable lisinopril and lasix  GERD - stable on omeprazole daily. She has intermittent belching and epigastric pain. She takes Tums prn.   PAF - rate controlled on amiodarone. Takes eiliquis  Constipation - stable on miralax and colace   Past Medical History  Diagnosis Date  . High blood pressure   . High cholesterol   . PERIPHERAL EDEMA 09/13/2009    Qualifier: Diagnosis of  By: Jenny Reichmann MD, [place] [facility]   . Paroxysmal atrial fibrillation ([place]) 09/13/2014  . Pacemaker-Biotronik 2006  . Dementia   . GERD (gastroesophageal reflux disease)   . Arthritis   . Chronic diastolic CHF (congestive heart failure) ([place])     a. 08/2014: EF 65-70% with Grade 1 DD     Past Surgical History  Procedure Laterality Date  . Abdominal hysterectomy    . Tonsillectomy    . Back operation    . Cholecystectomy    . Insert / replace / remove pacemaker      Patient Care Team: Gildardo Cranker, DO as PCP - General (Internal Medicine)  Social History   Social History  . Marital Status: Widowed    Spouse Name: N/A  . Number of Children: N/A  . Years of Education: N/A   Occupational History  . Not on file.   Social History Main Topics  . Smoking status: Never Smoker   . Smokeless tobacco:  Never Used  . Alcohol Use: No  . Drug Use: No  . Sexual Activity: Not on file   Other Topics Concern  . Not on file   Social History Narrative   Diet:      Do you drink/ eat things with caffeine? Coffee,soda      Marital status: Widow                              What year were you married ?      Do you live in a house, apartment,assistred living, condo, trailer, etc.)?Yes, House      Is it one or more stories? 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                              Type & how often:  Do you have a living will? No      Do you have a DNR form?  No                      If not, do you want to discuss one? No      Do you have signed POA?HPOA forms? Yes                If so, please bring to your        appointment           reports that she has never smoked. She has never used smokeless tobacco. She reports that she does not drink alcohol or use illicit drugs.  No Known Allergies  Medications: Patient's Medications  New Prescriptions   No medications on file  Previous Medications   ACETAMINOPHEN (TYLENOL) 500 MG TABLET    Take 1,000 mg by mouth every 6 (six) hours as needed for moderate pain.   AMIODARONE (PACERONE) 200 MG TABLET    Take 1 tablet (200 mg total) by mouth 2 (two) times daily.   APIXABAN (ELIQUIS) 5 MG TABS TABLET    Take 1 tablet (5 mg total) by mouth 2 (two) times daily.   DOCUSATE SODIUM (COLACE) 100 MG CAPSULE    Take 1 capsule (100 mg total) by mouth daily.   FUROSEMIDE (LASIX) 20 MG TABLET    Take 1-2 tablets (20-40 mg total) by mouth daily. Take 20 mg twice daily on Mon / Wed / Fri   METOPROLOL SUCCINATE (TOPROL-XL) 25 MG 24 HR TABLET    Take 1 tablet (25 mg total) by mouth daily.   OMEPRAZOLE (PRILOSEC) 40 MG CAPSULE    Take 1 capsule (40 mg total) by mouth daily.   POLYETHYLENE GLYCOL POWDER  (GLYCOLAX/MIRALAX) POWDER    Take 17 g by mouth daily. Dissolve on capful of powder into any fluid and drink once daily. May increase to two times daily if no effect in 3 days.   POTASSIUM CHLORIDE (K-DUR) 10 MEQ TABLET    Take 2 tablets (20 mEq total) by mouth 3 (three) times a week.  Modified Medications   Modified Medication Previous Medication   ZOSTER VACCINE LIVE, PF, (ZOSTAVAX) 13244 UNT/0.65ML INJECTION zoster vaccine live, PF, (ZOSTAVAX) 01027 UNT/0.65ML injection      Inject 19,400 Units into the skin once.    Inject 0.65 mLs into the skin once.  Discontinued Medications   No medications on file    Review of Systems  Unable to perform ROS: Dementia    Filed Vitals:   06/30/15 1530  BP: 116/80  Pulse: 66  Temp: 97.8 F (36.6 C)  TempSrc: Oral  Resp: 20  SpO2: 98%   There is no weight on file to calculate BMI.  Physical Exam  Constitutional: She appears well-developed and well-nourished.  HENT:  Mouth/Throat: Oropharynx is clear and moist. No oropharyngeal exudate.  Eyes: Pupils are equal, round, and reactive to light. No scleral icterus.  Neck: Neck supple. Carotid bruit is not present. No tracheal deviation present. No thyromegaly present.  Cardiovascular: Normal rate, regular rhythm and intact distal pulses.  Exam reveals no gallop and no friction rub.   Murmur (1/6 SEM) heard. +1 pitting LE edema b/l. no calf TTP.   Pulmonary/Chest: Effort normal and breath sounds normal. No stridor. No respiratory distress. She has no wheezes. She has no rales.  Abdominal: Soft. Bowel sounds are normal. She exhibits distension. She exhibits no  mass. There is no hepatomegaly. There is no tenderness. There is no rebound and no guarding.  Musculoskeletal: She exhibits edema and tenderness.  Strength 3/5 in LLE extensors and 4/5 in RLE extensors; flexors b/l 4/5 LE; grip strength intact b/l; neck flexion contracture  Lymphadenopathy:    She has no cervical adenopathy.  Neurological:  She is alert.  Skin: Skin is warm and dry. No rash noted.  Psychiatric: She has a normal mood and affect. Her behavior is normal.     Labs reviewed: Admission on 06/23/2015, Discharged on 06/24/2015  Component Date Value Ref Range Status  . Sodium 06/23/2015 139  135 - 145 mmol/L Final  . Potassium 06/23/2015 3.7  3.5 - 5.1 mmol/L Final  . Chloride 06/23/2015 105  101 - 111 mmol/L Final  . CO2 06/23/2015 24  22 - 32 mmol/L Final  . Glucose, Bld 06/23/2015 123* 65 - 99 mg/dL Final  . BUN 06/23/2015 15  6 - 20 mg/dL Final  . Creatinine, Ser 06/23/2015 0.82  0.44 - 1.00 mg/dL Final  . Calcium 06/23/2015 8.7* 8.9 - 10.3 mg/dL Final  . GFR calc non Af Amer 06/23/2015 >60  >60 mL/min Final  . GFR calc Af Amer 06/23/2015 >60  >60 mL/min Final   Comment: (NOTE) The eGFR has been calculated using the CKD EPI equation. This calculation has not been validated in all clinical situations. eGFR's persistently <60 mL/min signify possible Chronic Kidney Disease.   . Anion gap 06/23/2015 10  5 - 15 Final  . WBC 06/23/2015 7.6  4.0 - 10.5 K/uL Final  . RBC 06/23/2015 4.08  3.87 - 5.11 MIL/uL Final  . Hemoglobin 06/23/2015 12.2  12.0 - 15.0 g/dL Final  . HCT 06/23/2015 36.7  36.0 - 46.0 % Final  . MCV 06/23/2015 90.0  78.0 - 100.0 fL Final  . MCH 06/23/2015 29.9  26.0 - 34.0 pg Final  . MCHC 06/23/2015 33.2  30.0 - 36.0 g/dL Final  . RDW 06/23/2015 14.7  11.5 - 15.5 % Final  . Platelets 06/23/2015 246  150 - 400 K/uL Final  . Color, Urine 06/24/2015 AMBER* YELLOW Final   BIOCHEMICALS MAY BE AFFECTED BY COLOR  . APPearance 06/24/2015 CLOUDY* CLEAR Final  . Specific Gravity, Urine 06/24/2015 1.023  1.005 - 1.030 Final  . pH 06/24/2015 6.0  5.0 - 8.0 Final  . Glucose, UA 06/24/2015 NEGATIVE  NEGATIVE mg/dL Final  . Hgb urine dipstick 06/24/2015 TRACE* NEGATIVE Final  . Bilirubin Urine 06/24/2015 SMALL* NEGATIVE Final  . Ketones, ur 06/24/2015 NEGATIVE  NEGATIVE mg/dL Final  . Protein, ur  06/24/2015 30* NEGATIVE mg/dL Final  . Nitrite 06/24/2015 NEGATIVE  NEGATIVE Final  . Leukocytes, UA 06/24/2015 NEGATIVE  NEGATIVE Final  . Squamous Epithelial / LPF 06/24/2015 0-5* NONE SEEN Final  . WBC, UA 06/24/2015 0-5  0 - 5 WBC/hpf Final  . RBC / HPF 06/24/2015 6-30  0 - 5 RBC/hpf Final  . Bacteria, UA 06/24/2015 RARE* NONE SEEN Final  . Casts 06/24/2015 HYALINE CASTS* NEGATIVE Final  . Urine-Other 06/24/2015 MUCOUS PRESENT   Final  . Specimen Description 06/24/2015 URINE, CATHETERIZED   Final  . Special Requests 06/24/2015 NONE   Final  . Culture 06/24/2015 NO GROWTH 1 DAY   Final  . Report Status 06/24/2015 06/25/2015 FINAL   Final  Office Visit on 06/02/2015  Component Date Value Ref Range Status  . Brain Natriuretic Peptide 06/02/2015 9.1  <100 pg/mL Final   Comment:   BNP  levels increase with age in the general population with the highest values seen in individuals greater than 44 years of age. Reference: Joellyn Rued Cardiol 2002; 82:500-37.   ** Please note change in reference range(s). **     . Sodium 06/02/2015 138  135 - 146 mmol/L Final  . Potassium 06/02/2015 4.0  3.5 - 5.3 mmol/L Final  . Chloride 06/02/2015 101  98 - 110 mmol/L Final  . CO2 06/02/2015 24  20 - 31 mmol/L Final  . Glucose, Bld 06/02/2015 124* 65 - 99 mg/dL Final  . BUN 06/02/2015 12  7 - 25 mg/dL Final  . Creat 06/02/2015 0.80  0.60 - 0.88 mg/dL Final  . Calcium 06/02/2015 9.3  8.6 - 10.4 mg/dL Final  Admission on 05/30/2015, Discharged on 05/30/2015  Component Date Value Ref Range Status  . WBC 05/30/2015 8.4  4.0 - 10.5 K/uL Final  . RBC 05/30/2015 4.11  3.87 - 5.11 MIL/uL Final  . Hemoglobin 05/30/2015 12.8  12.0 - 15.0 g/dL Final  . HCT 05/30/2015 37.9  36.0 - 46.0 % Final  . MCV 05/30/2015 92.2  78.0 - 100.0 fL Final  . MCH 05/30/2015 31.1  26.0 - 34.0 pg Final  . MCHC 05/30/2015 33.8  30.0 - 36.0 g/dL Final  . RDW 05/30/2015 15.2  11.5 - 15.5 % Final  . Platelets 05/30/2015 269  150 -  400 K/uL Final  . Neutrophils Relative % 05/30/2015 59   Final  . Lymphocytes Relative 05/30/2015 30   Final  . Monocytes Relative 05/30/2015 8   Final  . Eosinophils Relative 05/30/2015 2   Final  . Basophils Relative 05/30/2015 1   Final  . Neutro Abs 05/30/2015 4.9  1.7 - 7.7 K/uL Final  . Lymphs Abs 05/30/2015 2.5  0.7 - 4.0 K/uL Final  . Monocytes Absolute 05/30/2015 0.7  0.1 - 1.0 K/uL Final  . Eosinophils Absolute 05/30/2015 0.2  0.0 - 0.7 K/uL Final  . Basophils Absolute 05/30/2015 0.1  0.0 - 0.1 K/uL Final  . WBC Morphology 05/30/2015 ATYPICAL LYMPHOCYTES   Final  . Smear Review 05/30/2015 LARGE PLATELETS PRESENT   Final  . Sodium 05/30/2015 140  135 - 145 mmol/L Final  . Potassium 05/30/2015 4.0  3.5 - 5.1 mmol/L Final  . Chloride 05/30/2015 104  101 - 111 mmol/L Final  . CO2 05/30/2015 26  22 - 32 mmol/L Final  . Glucose, Bld 05/30/2015 101* 65 - 99 mg/dL Final  . BUN 05/30/2015 8  6 - 20 mg/dL Final  . Creatinine, Ser 05/30/2015 0.72  0.44 - 1.00 mg/dL Final  . Calcium 05/30/2015 9.1  8.9 - 10.3 mg/dL Final  . Total Protein 05/30/2015 7.7  6.5 - 8.1 g/dL Final  . Albumin 05/30/2015 3.2* 3.5 - 5.0 g/dL Final  . AST 05/30/2015 17  15 - 41 U/L Final  . ALT 05/30/2015 14  14 - 54 U/L Final  . Alkaline Phosphatase 05/30/2015 74  38 - 126 U/L Final  . Total Bilirubin 05/30/2015 0.2* 0.3 - 1.2 mg/dL Final  . GFR calc non Af Amer 05/30/2015 >60  >60 mL/min Final  . GFR calc Af Amer 05/30/2015 >60  >60 mL/min Final   Comment: (NOTE) The eGFR has been calculated using the CKD EPI equation. This calculation has not been validated in all clinical situations. eGFR's persistently <60 mL/min signify possible Chronic Kidney Disease.   . Anion gap 05/30/2015 10  5 - 15 Final  . Lipase 05/30/2015  23  11 - 51 U/L Final  . Lactic Acid, Venous 05/30/2015 1.60  0.5 - 2.0 mmol/L Final  . Lactic Acid, Venous 05/30/2015 1.66  0.5 - 2.0 mmol/L Final  Office Visit on 05/08/2015  Component  Date Value Ref Range Status  . TSH 05/08/2015 4.70*  Final   Comment:   Reference Range   > or = 20 Years  0.40-4.50   Pregnancy Range First trimester  0.26-2.66 Second trimester 0.55-2.73 Third trimester  0.43-2.91   ** Please note change in unit of measure and reference range(s). **     . WBC 05/08/2015 7.8  4.0 - 10.5 K/uL Final  . RBC 05/08/2015 4.35  3.87 - 5.11 MIL/uL Final  . Hemoglobin 05/08/2015 13.6  12.0 - 15.0 g/dL Final  . HCT 05/08/2015 39.4  36.0 - 46.0 % Final  . MCV 05/08/2015 90.6  78.0 - 100.0 fL Final  . MCH 05/08/2015 31.3  26.0 - 34.0 pg Final  . MCHC 05/08/2015 34.5  30.0 - 36.0 g/dL Final  . RDW 05/08/2015 14.5  11.5 - 15.5 % Final  . Platelets 05/08/2015 289  150 - 400 K/uL Final  . MPV 05/08/2015 10.7  8.6 - 12.4 fL Final  . Brain Natriuretic Peptide 05/08/2015 11.8  <100 pg/mL Final   Comment:   BNP levels increase with age in the general population with the highest values seen in individuals greater than 19 years of age. Reference: Joellyn Rued Cardiol 2002; 74:259-56.   ** Please note change in reference range(s). **     . Sodium 05/08/2015 137  135 - 146 mmol/L Final  . Potassium 05/08/2015 4.0  3.5 - 5.3 mmol/L Final  . Chloride 05/08/2015 101  98 - 110 mmol/L Final  . CO2 05/08/2015 21  20 - 31 mmol/L Final  . Glucose, Bld 05/08/2015 114* 65 - 99 mg/dL Final  . BUN 05/08/2015 13  7 - 25 mg/dL Final  . Creat 05/08/2015 0.84  0.60 - 0.88 mg/dL Final  . Total Bilirubin 05/08/2015 0.3  0.2 - 1.2 mg/dL Final  . Alkaline Phosphatase 05/08/2015 95  33 - 130 U/L Final  . AST 05/08/2015 15  10 - 35 U/L Final  . ALT 05/08/2015 13  6 - 29 U/L Final  . Total Protein 05/08/2015 8.1  6.1 - 8.1 g/dL Final  . Albumin 05/08/2015 3.9  3.6 - 5.1 g/dL Final  . Calcium 05/08/2015 9.2  8.6 - 10.4 mg/dL Final  Office Visit on 04/28/2015  Component Date Value Ref Range Status  . Glucose 04/28/2015 139* 65 - 99 mg/dL Final  . BUN 04/28/2015 10  8 - 27 mg/dL  Final  . Creatinine, Ser 04/28/2015 0.76  0.57 - 1.00 mg/dL Final  . GFR calc non Af Amer 04/28/2015 71  >59 mL/min/1.73 Final  . GFR calc Af Amer 04/28/2015 82  >59 mL/min/1.73 Final  . BUN/Creatinine Ratio 04/28/2015 13  11 - 26 Final  . Sodium 04/28/2015 138  134 - 144 mmol/L Final  . Potassium 04/28/2015 4.1  3.5 - 5.2 mmol/L Final  . Chloride 04/28/2015 100  96 - 106 mmol/L Final  . CO2 04/28/2015 21  18 - 29 mmol/L Final  . Calcium 04/28/2015 9.0  8.7 - 10.3 mg/dL Final  . Magnesium 04/28/2015 2.4* 1.6 - 2.3 mg/dL Final  Admission on 04/17/2015, Discharged on 04/22/2015  Component Date Value Ref Range Status  . Sodium 04/17/2015 142  135 - 145 mmol/L Final  .  Potassium 04/17/2015 4.1  3.5 - 5.1 mmol/L Final  . Chloride 04/17/2015 107  101 - 111 mmol/L Final  . CO2 04/17/2015 24  22 - 32 mmol/L Final  . Glucose, Bld 04/17/2015 120* 65 - 99 mg/dL Final  . BUN 04/17/2015 12  6 - 20 mg/dL Final  . Creatinine, Ser 04/17/2015 0.75  0.44 - 1.00 mg/dL Final  . Calcium 04/17/2015 9.1  8.9 - 10.3 mg/dL Final  . GFR calc non Af Amer 04/17/2015 >60  >60 mL/min Final  . GFR calc Af Amer 04/17/2015 >60  >60 mL/min Final   Comment: (NOTE) The eGFR has been calculated using the CKD EPI equation. This calculation has not been validated in all clinical situations. eGFR's persistently <60 mL/min signify possible Chronic Kidney Disease.   . Anion gap 04/17/2015 11  5 - 15 Final  . WBC 04/17/2015 8.7  4.0 - 10.5 K/uL Final  . RBC 04/17/2015 4.00  3.87 - 5.11 MIL/uL Final  . Hemoglobin 04/17/2015 12.0  12.0 - 15.0 g/dL Final  . HCT 04/17/2015 36.7  36.0 - 46.0 % Final  . MCV 04/17/2015 91.8  78.0 - 100.0 fL Final  . MCH 04/17/2015 30.0  26.0 - 34.0 pg Final  . MCHC 04/17/2015 32.7  30.0 - 36.0 g/dL Final  . RDW 04/17/2015 14.9  11.5 - 15.5 % Final  . Platelets 04/17/2015 286  150 - 400 K/uL Final  . Troponin I 04/17/2015 <0.03  <0.031 ng/mL Final   Comment:        NO INDICATION  OF MYOCARDIAL INJURY.   . B Natriuretic Peptide 04/17/2015 40.9  0.0 - 100.0 pg/mL Final  . Color, Urine 04/18/2015 YELLOW  YELLOW Final  . APPearance 04/18/2015 CLOUDY* CLEAR Final  . Specific Gravity, Urine 04/18/2015 1.017  1.005 - 1.030 Final  . pH 04/18/2015 5.5  5.0 - 8.0 Final  . Glucose, UA 04/18/2015 NEGATIVE  NEGATIVE mg/dL Final  . Hgb urine dipstick 04/18/2015 TRACE* NEGATIVE Final  . Bilirubin Urine 04/18/2015 NEGATIVE  NEGATIVE Final  . Ketones, ur 04/18/2015 NEGATIVE  NEGATIVE mg/dL Final  . Protein, ur 04/18/2015 NEGATIVE  NEGATIVE mg/dL Final  . Nitrite 04/18/2015 NEGATIVE  NEGATIVE Final  . Leukocytes, UA 04/18/2015 SMALL* NEGATIVE Final  . Troponin I 04/18/2015 <0.03  <0.031 ng/mL Final   Comment:        NO INDICATION OF MYOCARDIAL INJURY.   . Sodium 04/18/2015 142  135 - 145 mmol/L Final  . Potassium 04/18/2015 4.1  3.5 - 5.1 mmol/L Final  . Chloride 04/18/2015 107  101 - 111 mmol/L Final  . CO2 04/18/2015 27  22 - 32 mmol/L Final  . Glucose, Bld 04/18/2015 104* 65 - 99 mg/dL Final  . BUN 04/18/2015 12  6 - 20 mg/dL Final  . Creatinine, Ser 04/18/2015 0.77  0.44 - 1.00 mg/dL Final  . Calcium 04/18/2015 9.0  8.9 - 10.3 mg/dL Final  . GFR calc non Af Amer 04/18/2015 >60  >60 mL/min Final  . GFR calc Af Amer 04/18/2015 >60  >60 mL/min Final   Comment: (NOTE) The eGFR has been calculated using the CKD EPI equation. This calculation has not been validated in all clinical situations. eGFR's persistently <60 mL/min signify possible Chronic Kidney Disease.   . Anion gap 04/18/2015 8  5 - 15 Final  . WBC 04/18/2015 6.7  4.0 - 10.5 K/uL Final  . RBC 04/18/2015 4.08  3.87 - 5.11 MIL/uL Final  . Hemoglobin 04/18/2015 12.4  12.0 - 15.0 g/dL Final  . HCT 04/18/2015 37.6  36.0 - 46.0 % Final  . MCV 04/18/2015 92.2  78.0 - 100.0 fL Final  . MCH 04/18/2015 30.4  26.0 - 34.0 pg Final  . MCHC 04/18/2015 33.0  30.0 - 36.0 g/dL Final  . RDW 04/18/2015 14.7  11.5 - 15.5 %  Final  . Platelets 04/18/2015 276  150 - 400 K/uL Final  . Squamous Epithelial / LPF 04/18/2015 6-30* NONE SEEN Final  . WBC, UA 04/18/2015 0-5  0 - 5 WBC/hpf Final  . RBC / HPF 04/18/2015 0-5  0 - 5 RBC/hpf Final  . Bacteria, UA 04/18/2015 NONE SEEN  NONE SEEN Final  . Sodium 04/20/2015 139  135 - 145 mmol/L Final  . Potassium 04/20/2015 3.9  3.5 - 5.1 mmol/L Final  . Chloride 04/20/2015 105  101 - 111 mmol/L Final  . CO2 04/20/2015 27  22 - 32 mmol/L Final  . Glucose, Bld 04/20/2015 120* 65 - 99 mg/dL Final  . BUN 04/20/2015 18  6 - 20 mg/dL Final  . Creatinine, Ser 04/20/2015 0.87  0.44 - 1.00 mg/dL Final  . Calcium 04/20/2015 8.8* 8.9 - 10.3 mg/dL Final  . GFR calc non Af Amer 04/20/2015 58* >60 mL/min Final  . GFR calc Af Amer 04/20/2015 >60  >60 mL/min Final   Comment: (NOTE) The eGFR has been calculated using the CKD EPI equation. This calculation has not been validated in all clinical situations. eGFR's persistently <60 mL/min signify possible Chronic Kidney Disease.   . Anion gap 04/20/2015 7  5 - 15 Final  . Sodium 04/21/2015 139  135 - 145 mmol/L Final  . Potassium 04/21/2015 3.9  3.5 - 5.1 mmol/L Final  . Chloride 04/21/2015 101  101 - 111 mmol/L Final  . CO2 04/21/2015 28  22 - 32 mmol/L Final  . Glucose, Bld 04/21/2015 113* 65 - 99 mg/dL Final  . BUN 04/21/2015 21* 6 - 20 mg/dL Final  . Creatinine, Ser 04/21/2015 0.92  0.44 - 1.00 mg/dL Final  . Calcium 04/21/2015 9.0  8.9 - 10.3 mg/dL Final  . GFR calc non Af Amer 04/21/2015 54* >60 mL/min Final  . GFR calc Af Amer 04/21/2015 >60  >60 mL/min Final   Comment: (NOTE) The eGFR has been calculated using the CKD EPI equation. This calculation has not been validated in all clinical situations. eGFR's persistently <60 mL/min signify possible Chronic Kidney Disease.   . Anion gap 04/21/2015 10  5 - 15 Final  . Sodium 04/22/2015 141  135 - 145 mmol/L Final  . Potassium 04/22/2015 4.1  3.5 - 5.1 mmol/L Final  . Chloride  04/22/2015 106  101 - 111 mmol/L Final  . CO2 04/22/2015 27  22 - 32 mmol/L Final  . Glucose, Bld 04/22/2015 105* 65 - 99 mg/dL Final  . BUN 04/22/2015 17  6 - 20 mg/dL Final  . Creatinine, Ser 04/22/2015 0.84  0.44 - 1.00 mg/dL Final  . Calcium 04/22/2015 8.9  8.9 - 10.3 mg/dL Final  . GFR calc non Af Amer 04/22/2015 >60  >60 mL/min Final  . GFR calc Af Amer 04/22/2015 >60  >60 mL/min Final   Comment: (NOTE) The eGFR has been calculated using the CKD EPI equation. This calculation has not been validated in all clinical situations. eGFR's persistently <60 mL/min signify possible Chronic Kidney Disease.   . Anion gap 04/22/2015 8  5 - 15 Final    Dg Chest 1 View  06/23/2015  CLINICAL DATA:  82 year old female with fall EXAM: CHEST 1 VIEW COMPARISON:  Chest radiograph dated 04/17/2015 FINDINGS: Single-view of the chest demonstrates stable cardiomegaly. There is silhouetting of the left hemidiaphragm likely related to cardiomegaly. Left lung base consolidative changes is not excluded. The right lung is clear. There is no pneumothorax. There is degenerative changes of the spine and shoulders. No definite acute osseous pathology identified. IMPRESSION: No definite acute/ traumatic intrathoracic pathology identified. Stable cardiomegaly. Electronically Signed   By: Anner Crete M.D.   On: 06/23/2015 21:55   Dg Thoracic Spine 2 View  06/23/2015  CLINICAL DATA:  Mid to low back pain after a fall today. EXAM: THORACIC SPINE 2 VIEWS COMPARISON:  Two-view chest 04/17/2015.  CT chest 01/26/2009 FINDINGS: Examination is technically limited due to diffuse bone demineralization. Normal alignment of the thoracic spine. Diffuse degenerative changes with narrowed interspaces and endplate hypertrophic changes. No vertebral compression deformities identified. No paraspinal soft tissue swelling. IMPRESSION: Degenerative changes throughout the thoracic spine. No acute displaced fractures identified.  Electronically Signed   By: Lucienne Capers M.D.   On: 06/23/2015 21:55   Dg Lumbar Spine Complete  06/23/2015  CLINICAL DATA:  82 year old female with fall and low back pain. EXAM: LUMBAR SPINE - COMPLETE 4+ VIEW COMPARISON:  CT dated 05/30/2015 FINDINGS: There is no acute fracture or subluxation of the lumbar spine. There is osteopenia with multilevel degenerative changes. Stable appearing grade 1 L4-L5 anterolisthesis. The vertebral body heights are maintained. All stop the visualized transverse and spinous processes appear intact. Right upper quadrant cholecystectomy clips noted. Moderate stool throughout the colon. IMPRESSION: No acute/traumatic lumbar spine pathology. Electronically Signed   By: Anner Crete M.D.   On: 06/23/2015 21:54   Ct Head Wo Contrast  06/23/2015  CLINICAL DATA:  Fall hitting the back of the head. Patient on Eliquis. Complaining of headache. EXAM: CT HEAD WITHOUT CONTRAST CT CERVICAL SPINE WITHOUT CONTRAST TECHNIQUE: Multidetector CT imaging of the head and cervical spine was performed following the standard protocol without intravenous contrast. Multiplanar CT image reconstructions of the cervical spine were also generated. COMPARISON:  None. FINDINGS: CT HEAD FINDINGS The ventricles are normal in size, for this patient's age, and normal in configuration. There are no parenchymal masses or mass effect. There is no evidence of a cortical infarct. Mild patchy white matter hypoattenuation is noted consistent with chronic microvascular ischemic change. There are no extra-axial masses or abnormal fluid collections. There is no intracranial hemorrhage. Visualized sinuses and mastoid air cells are clear. No skull fracture. CT CERVICAL SPINE FINDINGS No fracture. No spondylolisthesis. There is a kyphosis, apex at C5. Mild loss of disc height at C3-C4-C4-C5 and C6-C7. Moderate to marked loss of disc height at C5-C6. Bones are demineralized. There are facet degenerative changes most  evident on the left from C2-C3 through C4-C5. Soft tissues are unremarkable. There is a focal peribronchovascular airspace opacity in the left upper lobe. This measures approximate 12 mm in greatest transverse dimension. It may reflect scarring or could be due to active inflammation. IMPRESSION: HEAD CT: No acute intracranial abnormalities. No intracranial hemorrhage. No skull fracture. CERVICAL CT: No fracture or acute bony abnormality. Small area of peribronchovascular airspace opacity in the left upper lobe. This could reflect chronic scarring. Infection or inflammation could have this appearance. Electronically Signed   By: Lajean Manes M.D.   On: 06/23/2015 21:50   Ct Cervical Spine Wo Contrast  06/23/2015  CLINICAL DATA:  Fall hitting the back of  the head. Patient on Eliquis. Complaining of headache. EXAM: CT HEAD WITHOUT CONTRAST CT CERVICAL SPINE WITHOUT CONTRAST TECHNIQUE: Multidetector CT imaging of the head and cervical spine was performed following the standard protocol without intravenous contrast. Multiplanar CT image reconstructions of the cervical spine were also generated. COMPARISON:  None. FINDINGS: CT HEAD FINDINGS The ventricles are normal in size, for this patient's age, and normal in configuration. There are no parenchymal masses or mass effect. There is no evidence of a cortical infarct. Mild patchy white matter hypoattenuation is noted consistent with chronic microvascular ischemic change. There are no extra-axial masses or abnormal fluid collections. There is no intracranial hemorrhage. Visualized sinuses and mastoid air cells are clear. No skull fracture. CT CERVICAL SPINE FINDINGS No fracture. No spondylolisthesis. There is a kyphosis, apex at C5. Mild loss of disc height at C3-C4-C4-C5 and C6-C7. Moderate to marked loss of disc height at C5-C6. Bones are demineralized. There are facet degenerative changes most evident on the left from C2-C3 through C4-C5. Soft tissues are  unremarkable. There is a focal peribronchovascular airspace opacity in the left upper lobe. This measures approximate 12 mm in greatest transverse dimension. It may reflect scarring or could be due to active inflammation. IMPRESSION: HEAD CT: No acute intracranial abnormalities. No intracranial hemorrhage. No skull fracture. CERVICAL CT: No fracture or acute bony abnormality. Small area of peribronchovascular airspace opacity in the left upper lobe. This could reflect chronic scarring. Infection or inflammation could have this appearance. Electronically Signed   By: Lajean Manes M.D.   On: 06/23/2015 21:50     Assessment/Plan    ICD-9-CM ICD-10-CM   1. Primary osteoarthritis involving multiple joints 715.09 M15.0 traMADol (ULTRAM) 50 MG tablet  2. Need for zoster vaccination V04.89 Z23 zoster vaccine live, PF, (ZOSTAVAX) 92119 UNT/0.65ML injection  3. Left hip pain 719.45 M25.552 traMADol (ULTRAM) 50 MG tablet  4. Paroxysmal atrial fibrillation (HCC) 427.31 I48.0   5. Essential hypertension, benign 401.1 I10   6. Gastroesophageal reflux disease without esophagitis 530.81 K21.9   7. Edema, unspecified type 782.3 R60.9    May take tramadol as needed for pain up to 3 times daily  Continue other medications as ordered  Follow up in 1 month for routine visit  Adalaide Jaskolski S. Perlie Gold  [facility] [address] [place], [place] [address] [phone] Cell (Monday-Friday 8 AM - 5 PM) [phone] After 5 PM and follow prompts

## 2015-07-19 ENCOUNTER — Encounter: Payer: Self-pay | Admitting: Cardiology

## 2015-07-23 ENCOUNTER — Encounter (HOSPITAL_COMMUNITY): Payer: Self-pay | Admitting: Emergency Medicine

## 2015-07-23 ENCOUNTER — Emergency Department (HOSPITAL_COMMUNITY)
Admission: EM | Admit: 2015-07-23 | Discharge: 2015-07-24 | Disposition: A | Discharge status: Home / Self Care | Payer: Medicare Other | Attending: Emergency Medicine | Admitting: Emergency Medicine

## 2015-07-23 ENCOUNTER — Emergency Department (HOSPITAL_COMMUNITY): Payer: Medicare Other

## 2015-07-23 DIAGNOSIS — Z7902 Long term (current) use of antithrombotics/antiplatelets: Secondary | ICD-10-CM | POA: Diagnosis not present

## 2015-07-23 DIAGNOSIS — I480 Paroxysmal atrial fibrillation: Secondary | ICD-10-CM | POA: Diagnosis not present

## 2015-07-23 DIAGNOSIS — F0390 Unspecified dementia without behavioral disturbance: Secondary | ICD-10-CM | POA: Diagnosis not present

## 2015-07-23 DIAGNOSIS — I10 Essential (primary) hypertension: Secondary | ICD-10-CM | POA: Insufficient documentation

## 2015-07-23 DIAGNOSIS — M1990 Unspecified osteoarthritis, unspecified site: Secondary | ICD-10-CM | POA: Insufficient documentation

## 2015-07-23 DIAGNOSIS — R079 Chest pain, unspecified: Secondary | ICD-10-CM | POA: Insufficient documentation

## 2015-07-23 DIAGNOSIS — Z8639 Personal history of other endocrine, nutritional and metabolic disease: Secondary | ICD-10-CM | POA: Insufficient documentation

## 2015-07-23 DIAGNOSIS — K219 Gastro-esophageal reflux disease without esophagitis: Secondary | ICD-10-CM | POA: Diagnosis not present

## 2015-07-23 DIAGNOSIS — H578 Other specified disorders of eye and adnexa: Secondary | ICD-10-CM | POA: Insufficient documentation

## 2015-07-23 DIAGNOSIS — R600 Localized edema: Secondary | ICD-10-CM | POA: Diagnosis not present

## 2015-07-23 DIAGNOSIS — Z950 Presence of cardiac pacemaker: Secondary | ICD-10-CM | POA: Diagnosis not present

## 2015-07-23 DIAGNOSIS — N390 Urinary tract infection, site not specified: Secondary | ICD-10-CM | POA: Insufficient documentation

## 2015-07-23 DIAGNOSIS — Z79899 Other long term (current) drug therapy: Secondary | ICD-10-CM | POA: Insufficient documentation

## 2015-07-23 DIAGNOSIS — R55 Syncope and collapse: Secondary | ICD-10-CM | POA: Diagnosis present

## 2015-07-23 DIAGNOSIS — I5032 Chronic diastolic (congestive) heart failure: Secondary | ICD-10-CM | POA: Insufficient documentation

## 2015-07-23 DIAGNOSIS — R531 Weakness: Secondary | ICD-10-CM

## 2015-07-23 LAB — I-STAT TROPONIN, ED: Troponin i, poc: 0.01 ng/mL (ref 0.00–0.08)

## 2015-07-23 NOTE — ED Provider Notes (Signed)
CSN: [id number]     Arrival date & time 07/23/15  2204 History  By signing my name below, I, Tara Hughes, attest that this documentation has been prepared under the direction and in the presence of Laurence Spatesachel Morgan Asa Fath, MD. Electronically Signed: Phillis HaggisGabriella Hughes, ED Scribe. 07/23/2015. 1:30 AM.  Chief Complaint  Patient presents with  . Loss of Consciousness  . Chest Pain   The history is provided by the EMS personnel. No language interpreter was used.  HPI Comments (Level 5 Caveat due to dementia): Tara Hughes is a 82 y.o. female with a hx of HTN, peripheral edema, A-Fib, dementia, CHF, and pacemaker placement [facility] who presents to the Emergency Department complaining of an episode of near syncope onset PTA. Pt's son reports that he was assisting the patient in the bathroom when she became weak in her legs. He was able to help her to the toilet and she did not fall or strike her head. He states that she was awake during the episode. He does report that the patient walks with a walker and needs assistance with all ADLs. Son reports that pt complained of a headache earlier today, but had no other complaints. She does have headaches intermittently for which he gives her tramadol. Son reports that the patient has not had any recent fevers, vomiting, cough/cold symptoms, or complaints of pain. She has been at her neurologic baseline during the ED stay.  Past Medical History  Diagnosis Date  . High blood pressure   . High cholesterol   . PERIPHERAL EDEMA 09/13/2009    Qualifier: Diagnosis of  By: Jonny RuizJohn MD, Len BlalockJames W   . Paroxysmal atrial fibrillation (HCC) 09/13/2014  . Pacemaker-Biotronik 2006  . Dementia   . GERD (gastroesophageal reflux disease)   . Arthritis   . Chronic diastolic CHF (congestive heart failure) (HCC)     a. 08/2014: EF 65-70% with Grade 1 DD    Past Surgical History  Procedure Laterality Date  . Abdominal hysterectomy    . Tonsillectomy    . Back  operation    . Cholecystectomy    . Insert / replace / remove pacemaker     Family History  Problem Relation Age of Onset  . Cancer Father   . Diabetes Brother   . Diverticulitis Sister   . Heart disease Sister    Social History  Substance Use Topics  . Smoking status: Never Smoker   . Smokeless tobacco: Never Used  . Alcohol Use: No   OB History    No data available     Review of Systems  Unable to perform ROS: Dementia   Allergies  Review of patient's allergies indicates no known allergies.  Home Medications   Prior to Admission medications   Medication Sig Start Date End Date Taking? Authorizing Provider  acetaminophen (TYLENOL) 500 MG tablet Take 1,000 mg by mouth every 6 (six) hours as needed for moderate pain.   Yes Historical Provider, MD  amiodarone (PACERONE) 200 MG tablet Take 1 tablet (200 mg total) by mouth 2 (two) times daily. 06/02/15  Yes Pricilla RifflePaula Ross V, MD  apixaban (ELIQUIS) 5 MG TABS tablet Take 1 tablet (5 mg total) by mouth 2 (two) times daily. 06/02/15  Yes Pricilla RifflePaula Ross V, MD  docusate sodium (COLACE) 100 MG capsule Take 1 capsule (100 mg total) by mouth daily. 05/30/15  Yes Lavera Guiseana Duo Liu, MD  furosemide (LASIX) 20 MG tablet Take 1-2 tablets (20-40 mg total) by mouth  daily. Take 20 mg twice daily on Mon / Wed / Fri 06/02/15  Yes Pricilla Riffle, MD  metoprolol succinate (TOPROL-XL) 25 MG 24 hr tablet Take 1 tablet (25 mg total) by mouth daily. 06/02/15  Yes Pricilla Riffle, MD  omeprazole (PRILOSEC) 40 MG capsule Take 1 capsule (40 mg total) by mouth daily. 06/02/15  Yes Pricilla Riffle, MD  polyethylene glycol powder (GLYCOLAX/MIRALAX) powder Take 17 g by mouth daily. Dissolve on capful of powder into any fluid and drink once daily. May increase to two times daily if no effect in 3 days. 05/30/15  Yes Lavera Guise, MD  potassium chloride (K-DUR) 10 MEQ tablet Take 2 tablets (20 mEq total) by mouth 3 (three) times a week. 06/02/15  Yes Pricilla Riffle, MD  traMADol (ULTRAM) 50 MG tablet  Take 1 tablet (50 mg total) by mouth 3 (three) times daily with meals as needed for moderate pain or severe pain. 06/30/15  Yes [name], DO  zoster vaccine live, PF, (ZOSTAVAX) 16109 UNT/0.65ML injection Inject 19,400 Units into the skin once. 06/30/15  Yes [name], DO  cephALEXin (KEFLEX) 500 MG capsule Take 1 capsule (500 mg total) by mouth 2 (two) times daily. 07/24/15   Ambrose Finland Daurice Ovando, MD   BP 156/75 mmHg  Pulse 76  Resp 12  SpO2 100% Physical Exam  Constitutional: She appears well-developed and well-nourished. No distress.  Awake, alert  HENT:  Head: Normocephalic and atraumatic.  Eyes: EOM are normal. Pupils are equal, round, and reactive to light.  Crusting of eyelids bilaterally  Neck: Neck supple.  Cardiovascular: Normal rate, regular rhythm and normal heart sounds.   No murmur heard. Pulmonary/Chest: No respiratory distress.  Pacemaker left upper chest; Diminished breath sounds bilaterally  Abdominal: Bowel sounds are normal. She exhibits distension. There is no tenderness.  Moderately distended  Musculoskeletal: She exhibits edema.  2+ pitting edema to mid shins  Neurological: She is alert. She has normal reflexes. No cranial nerve deficit. She exhibits normal muscle tone.  Alert and oriented x1; mild resting tremor of right arm; moderate stiffness globally; able to follow basic commands  Skin: Skin is warm and dry.  Nursing note and vitals reviewed.   ED Course  Procedures (including critical care time) DIAGNOSTIC STUDIES: Oxygen Saturation is 100% on RA, normal by my interpretation.    COORDINATION OF CARE: 12:04 AM- labs, chest x-ray and EKG  Labs Review Labs Reviewed  BASIC METABOLIC PANEL - Abnormal; Notable for the following:    Glucose, Bld 116 (*)    Calcium 8.7 (*)    GFR calc non Af Amer 56 (*)    All other components within normal limits  CBC - Abnormal; Notable for the following:    Hemoglobin 11.9 (*)    HCT 35.9 (*)    All other  components within normal limits  URINALYSIS, ROUTINE W REFLEX MICROSCOPIC (NOT AT [facility]) - Abnormal; Notable for the following:    Hgb urine dipstick LARGE (*)    Leukocytes, UA TRACE (*)    All other components within normal limits  URINE MICROSCOPIC-ADD ON - Abnormal; Notable for the following:    Squamous Epithelial / LPF 0-5 (*)    Bacteria, UA RARE (*)    All other components within normal limits  URINE CULTURE  BRAIN NATRIURETIC PEPTIDE  I-STAT TROPOININ, ED    Imaging Review Dg Chest 2 View  07/23/2015  CLINICAL DATA:  Acute onset of near-syncope.  Initial encounter.  EXAM: CHEST  2 VIEW COMPARISON:  Chest radiograph performed 06/23/2015 FINDINGS: The lungs are well-aerated. Mild vascular congestion is noted. There is no evidence of focal opacification, pleural effusion or pneumothorax. The heart is mildly enlarged. A pacemaker is noted at the left chest wall, with leads ending at the right atrium and right ventricle. No acute osseous abnormalities are seen. IMPRESSION: Mild vascular congestion and mild cardiomegaly noted. Lungs remain grossly clear. Electronically Signed   By: Roanna Raider M.D.   On: 07/23/2015 23:30   I have personally reviewed and evaluated these lab results as part of my medical decision-making.   EKG Interpretation   Date/Time:  Sunday July 23 2015 22:22:44 EDT Ventricular Rate:  70 PR Interval:  212 QRS Duration: 68 QT Interval:  416 QTC Calculation: 449 R Axis:   51 Text Interpretation:  Atrial-paced rhythm with prolonged AV conduction  Nonspecific ST and T wave abnormality Abnormal ECG No significant change  since last tracing Confirmed by Duward Allbritton MD, Delonte Musich [phone]) on 07/23/2015  11:11:12 PM     Medications  sterile water (preservative free) injection (not administered)  cefTRIAXone (ROCEPHIN) injection 1 g (1 g Intramuscular Given 07/24/15 0444)    MDM   Final diagnoses:  UTI (lower urinary tract infection)  Weakness   Pt w/ h/o dementia  presents after an episode of weakness in the bathroom when her son was assisting her. On arrival by EMS, the patient was awake, alert, able to follow basic commands although oriented only to person. Vital signs show mild hypertension, otherwise unremarkable. She had no abdominal tenderness or complaints during my exam. EKG shows paced rhythm similar to previous EKG. Chest x-ray shows mild vascular congestion but no other acute process.  Labwork shows normal troponin, unremarkable CBC and BMP, normal BNP, catheterized UA showing wbc's, rbc's, and bacteria with trace leukocytes. Culture sent. Gave the patient a dose of ceftriaxone and course of Keflex to treat UTI given her advanced age and risk of complications. On reexamination after several hours in the ED, the patient continues to remain comfortable with no complaints. Her son states that she is at her neurologic baseline of dementia. I discussed treatment of UTI and explained that I feel her risk of nosocomial infection if admitted may outweigh benefit of admission for UTI and because she is comfortable w/ normal VS, I feel she is safe for outpatient treatment. Son was in agreement with this plan and voiced understanding of return precautions. Patient discharged in satisfactory condition.  I personally performed the services described in this documentation, which was scribed in my presence. The recorded information has been reviewed and is accurate.   Laurence Spates, MD 07/24/15 [phone]

## 2015-07-23 NOTE — ED Notes (Signed)
Patient called out, requesting assistance with restroom - attempted to put pt on bedpan, but pt refused to help this RN and Cala BradfordKimberly, EMT with turning. Was about to in-and-out cath pt next, when she urinates all over herself and the bed. Full linen changed. MD aware.

## 2015-07-23 NOTE — ED Notes (Signed)
PER [facility]: Patient to ED from home following near-syncopal episode - per pt's son, pt was standing in front of bathroom sink and began slumping over. Son was by her side at the time and helped her to the toilet - unsure of whether or not she actually passed out. Did not fall. Pt was complaining of a headache earlier today, but no other complaints until upon arrival to ED - per EMS, pt had a brief episode of chest pain when she got here. EMS VS: CBG 101, HR 70s atrial paced/atrial flutter until arrival - jumped to 120 for a minute. Patient A&O x 4.

## 2015-07-24 DIAGNOSIS — N390 Urinary tract infection, site not specified: Secondary | ICD-10-CM | POA: Diagnosis not present

## 2015-07-24 LAB — URINALYSIS, ROUTINE W REFLEX MICROSCOPIC
Bilirubin Urine: NEGATIVE
GLUCOSE, UA: NEGATIVE mg/dL
Ketones, ur: NEGATIVE mg/dL
Nitrite: NEGATIVE
PH: 7.5 (ref 5.0–8.0)
Protein, ur: NEGATIVE mg/dL
Specific Gravity, Urine: 1.016 (ref 1.005–1.030)

## 2015-07-24 LAB — CBC
HCT: 35.9 % — ABNORMAL LOW (ref 36.0–46.0)
Hemoglobin: 11.9 g/dL — ABNORMAL LOW (ref 12.0–15.0)
MCH: 30.1 pg (ref 26.0–34.0)
MCHC: 33.1 g/dL (ref 30.0–36.0)
MCV: 90.9 fL (ref 78.0–100.0)
PLATELETS: 237 10*3/uL (ref 150–400)
RBC: 3.95 MIL/uL (ref 3.87–5.11)
RDW: 14.9 % (ref 11.5–15.5)
WBC: 7.9 10*3/uL (ref 4.0–10.5)

## 2015-07-24 LAB — BASIC METABOLIC PANEL
ANION GAP: 11 (ref 5–15)
BUN: 12 mg/dL (ref 6–20)
CALCIUM: 8.7 mg/dL — AB (ref 8.9–10.3)
CO2: 25 mmol/L (ref 22–32)
Chloride: 103 mmol/L (ref 101–111)
Creatinine, Ser: 0.9 mg/dL (ref 0.44–1.00)
GFR, EST NON AFRICAN AMERICAN: 56 mL/min — AB (ref >60)
Glucose, Bld: 116 mg/dL — ABNORMAL HIGH (ref 65–99)
Potassium: 3.9 mmol/L (ref 3.5–5.1)
Sodium: 139 mmol/L (ref 135–145)

## 2015-07-24 LAB — BRAIN NATRIURETIC PEPTIDE: B Natriuretic Peptide: 61.2 pg/mL (ref 0.0–100.0)

## 2015-07-24 LAB — URINE MICROSCOPIC-ADD ON

## 2015-07-24 MED ORDER — STERILE WATER FOR INJECTION IJ SOLN
INTRAMUSCULAR | Status: AC
  Filled 2015-07-24: qty 10

## 2015-07-24 MED ORDER — CEFTRIAXONE SODIUM 1 G IJ SOLR
1.0000 g | Freq: Once | INTRAMUSCULAR | Status: AC
  Administered 2015-07-24: 1 g via INTRAMUSCULAR
  Filled 2015-07-24: qty 10

## 2015-07-24 MED ORDER — CEPHALEXIN 500 MG PO CAPS: 500.0000 mg | ORAL_CAPSULE | Freq: Two times a day (BID) | ORAL | Status: DC

## 2015-07-26 ENCOUNTER — Encounter: Payer: Self-pay | Admitting: *Deleted

## 2015-07-26 LAB — URINE CULTURE: Special Requests: NORMAL

## 2015-07-27 ENCOUNTER — Telehealth: Payer: Self-pay | Admitting: *Deleted

## 2015-07-27 NOTE — ED Notes (Signed)
Post ED Visit - Positive Culture Follow-up  Culture report reviewed by antimicrobial stewardship pharmacist:  [] Nathan Batchelder, Pharm.D. [x] Jeremy Frens, Pharm.D., BCPS [] Mike Maccia, Pharm.D. [] Elizabeth Martin, Pharm.D., BCPS [] Minh Pham, Pharm.D., BCPS, AAHIVP [] Michelle Turner, Pharm.D., BCPS, AAHIVP [] Cassie Stewart, Pharm.D. [] Rob Vincent, Pharm.D.  Positive urine culture Treated with Cephalexin, organism sensitive to the same and no further patient follow-up is required at this time.  Mckinnley Cottier Talley 07/27/2015, 12:06 PM   

## 2015-07-28 ENCOUNTER — Other Ambulatory Visit: Payer: Self-pay | Admitting: Internal Medicine

## 2015-08-02 ENCOUNTER — Ambulatory Visit (INDEPENDENT_AMBULATORY_CARE_PROVIDER_SITE_OTHER): Payer: Medicare Other | Admitting: Internal Medicine

## 2015-08-02 VITALS — BP 130/90 | HR 67 | Temp 97.8°F | Resp 20

## 2015-08-02 DIAGNOSIS — R441 Visual hallucinations: Secondary | ICD-10-CM | POA: Diagnosis not present

## 2015-08-02 DIAGNOSIS — R5381 Other malaise: Secondary | ICD-10-CM

## 2015-08-02 DIAGNOSIS — M150 Primary generalized (osteo)arthritis: Secondary | ICD-10-CM | POA: Diagnosis not present

## 2015-08-02 DIAGNOSIS — F0390 Unspecified dementia without behavioral disturbance: Secondary | ICD-10-CM

## 2015-08-02 DIAGNOSIS — I10 Essential (primary) hypertension: Secondary | ICD-10-CM

## 2015-08-02 DIAGNOSIS — M159 Polyosteoarthritis, unspecified: Secondary | ICD-10-CM

## 2015-08-02 DIAGNOSIS — I480 Paroxysmal atrial fibrillation: Secondary | ICD-10-CM | POA: Diagnosis not present

## 2015-08-02 DIAGNOSIS — M8949 Other hypertrophic osteoarthropathy, multiple sites: Secondary | ICD-10-CM

## 2015-08-02 NOTE — Patient Instructions (Addendum)
Recommend Adult Day care for socialization. Will complete paperwork once receive it.  Continue current medications as ordered  Follow up 1 month for routine visit

## 2015-08-02 NOTE — Progress Notes (Signed)
Patient ID: Tara Hughes, female   DOB: 31-Oct-1925, 82 y.o.   MRN: [id number]    Location:    [facility]   Place of Service:   OFFICE  Chief Complaint  Patient presents with  . Medical Management of Chronic Issues    Discuss Adult Day Care and status update  . OTHER    Sister and son in room with patient    HPI:  82 yo female seen today for f/u. She was seen in the ED on 4/23rd for CP. CXR revealed mild vascular congestion but no acute process. She was dx with UTI and tx with keflex. HC POA, Donna,and son present. Interested in adult day care at least 2 times per week but c/a pt's contractures and whether safe for her to travel by [place] [facility] to center. She has completed UTI tx.   Pt is a poor historian due to dementia. Hx obtained from chart  Edema - fluctuates. She takes lasix daily along Potassium supplement  HTN - BP stable lisinopril and lasix  GERD - stable on omeprazole daily. She has intermittent belching and epigastric pain. She takes Tums prn.   PAF - rate controlled on amiodarone. Takes eiliquis  OA - she is w/c bound and has UE and neck contractures  Constipation - stable on miralax and colace  Dementia/hx hallucinations - stable. She does not take any meds  Past Medical History  Diagnosis Date  . High blood pressure   . High cholesterol   . PERIPHERAL EDEMA 09/13/2009    Qualifier: Diagnosis of  By: Jenny Reichmann MD, Hunt Oris   . Paroxysmal atrial fibrillation ([place]) 09/13/2014  . Pacemaker-Biotronik 2006  . Dementia   . GERD (gastroesophageal reflux disease)   . Arthritis   . Chronic diastolic CHF (congestive heart failure) ([place])     a. 08/2014: EF 65-70% with Grade 1 DD     Past Surgical History  Procedure Laterality Date  . Abdominal hysterectomy    . Tonsillectomy    . Back operation    . Cholecystectomy    . Insert / replace / remove pacemaker      Patient Care Team: Gildardo Cranker, DO as PCP - General (Internal Medicine)  Social History   Social History  .  Marital Status: Widowed    Spouse Name: N/A  . Number of Children: N/A  . Years of Education: N/A   Occupational History  . Not on file.   Social History Main Topics  . Smoking status: Never Smoker   . Smokeless tobacco: Never Used  . Alcohol Use: No  . Drug Use: No  . Sexual Activity: Not on file   Other Topics Concern  . Not on file   Social History Narrative   Diet:      Do you drink/ eat things with caffeine? Coffee,soda      Marital status: Widow                              What year were you married ?      Do you live in a house, apartment,assistred living, condo, trailer, etc.)?Yes, House      Is it one or more stories? 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                              Type & how often:      Do you have a living will? No      Do you have a DNR form?  No                      If not, do you want to discuss one? No      Do you have signed POA?HPOA forms? Yes                If so, please bring to your        appointment           reports that she has never smoked. She has never used smokeless tobacco. She reports that she does not drink alcohol or use illicit drugs.  No Known Allergies  Medications: Patient's Medications  New Prescriptions   No medications on file  Previous Medications   ACETAMINOPHEN (TYLENOL) 500 MG TABLET    Take 1,000 mg by mouth every 6 (six) hours as needed for moderate pain.   AMIODARONE (PACERONE) 200 MG TABLET    Take 1 tablet (200 mg total) by mouth 2 (two) times daily.   APIXABAN (ELIQUIS) 5 MG TABS TABLET    Take 1 tablet (5 mg total) by mouth 2 (two) times daily.   CEPHALEXIN (KEFLEX) 500 MG CAPSULE    Take 1 capsule (500 mg total) by mouth 2 (two) times daily.   DOCUSATE SODIUM (COLACE) 100 MG CAPSULE    Take 1 capsule (100 mg total) by mouth daily.   FUROSEMIDE (LASIX)  20 MG TABLET    Take 1-2 tablets (20-40 mg total) by mouth daily. Take 20 mg twice daily on Mon / Wed / Fri   METOPROLOL SUCCINATE (TOPROL-XL) 25 MG 24 HR TABLET    Take 1 tablet (25 mg total) by mouth daily.   OMEPRAZOLE (PRILOSEC) 40 MG CAPSULE    Take 1 capsule (40 mg total) by mouth daily.   POLYETHYLENE GLYCOL POWDER (GLYCOLAX/MIRALAX) POWDER    Take 17 g by mouth daily. Dissolve on capful of powder into any fluid and drink once daily. May increase to two times daily if no effect in 3 days.   POTASSIUM CHLORIDE (K-DUR) 10 MEQ TABLET    Take 2 tablets (20 mEq total) by mouth 3 (three) times a week.   TRAMADOL (ULTRAM) 50 MG TABLET    Take 1 tablet (50 mg total) by mouth 3 (three) times daily with meals as needed for moderate pain or severe pain.   ZOSTER VACCINE LIVE, PF, (ZOSTAVAX) 16109 UNT/0.65ML INJECTION    Inject 19,400 Units into the skin once.  Modified Medications   No medications on file  Discontinued Medications   No medications on file    Review of Systems  Unable to perform ROS: Dementia    Filed Vitals:   08/02/15 1039  BP: 130/90  Pulse: 67  Temp: 97.8 F (36.6 C)  TempSrc: Oral  Resp: 20  SpO2: 86%  Repeat 98% on RA   There is no weight on file to calculate BMI.  Physical Exam  Constitutional: She appears well-developed.  Cardiovascular: Normal rate, regular rhythm and normal heart sounds.  Exam reveals no gallop and no friction rub.   No murmur heard. Pulmonary/Chest: No respiratory distress. She has no  wheezes. She has no rales.  Musculoskeletal: She exhibits edema.  Neck flexion contracture  Neurological: She is alert.  Psychiatric: She has a normal mood and affect. Her behavior is normal.     Labs reviewed: Admission on 07/23/2015, Discharged on 07/24/2015  Component Date Value Ref Range Status  . Sodium 07/23/2015 139  135 - 145 mmol/L Final  . Potassium 07/23/2015 3.9  3.5 - 5.1 mmol/L Final  . Chloride 07/23/2015 103  101 - 111 mmol/L Final  .  CO2 07/23/2015 25  22 - 32 mmol/L Final  . Glucose, Bld 07/23/2015 116* 65 - 99 mg/dL Final  . BUN 07/23/2015 12  6 - 20 mg/dL Final  . Creatinine, Ser 07/23/2015 0.90  0.44 - 1.00 mg/dL Final  . Calcium 07/23/2015 8.7* 8.9 - 10.3 mg/dL Final  . GFR calc non Af Amer 07/23/2015 56* >60 mL/min Final  . GFR calc Af Amer 07/23/2015 >60  >60 mL/min Final   Comment: (NOTE) The eGFR has been calculated using the CKD EPI equation. This calculation has not been validated in all clinical situations. eGFR's persistently <60 mL/min signify possible Chronic Kidney Disease.   . Anion gap 07/23/2015 11  5 - 15 Final  . WBC 07/23/2015 7.9  4.0 - 10.5 K/uL Final  . RBC 07/23/2015 3.95  3.87 - 5.11 MIL/uL Final  . Hemoglobin 07/23/2015 11.9* 12.0 - 15.0 g/dL Final  . HCT 07/23/2015 35.9* 36.0 - 46.0 % Final  . MCV 07/23/2015 90.9  78.0 - 100.0 fL Final  . MCH 07/23/2015 30.1  26.0 - 34.0 pg Final  . MCHC 07/23/2015 33.1  30.0 - 36.0 g/dL Final  . RDW 07/23/2015 14.9  11.5 - 15.5 % Final  . Platelets 07/23/2015 237  150 - 400 K/uL Final  . Troponin i, poc 07/23/2015 0.01  0.00 - 0.08 ng/mL Final  . Comment 3 07/23/2015          Final   Comment: Due to the release kinetics of cTnI, a negative result within the first hours of the onset of symptoms does not rule out myocardial infarction with certainty. If myocardial infarction is still suspected, repeat the test at appropriate intervals.   . Color, Urine 07/24/2015 YELLOW  YELLOW Final  . APPearance 07/24/2015 CLEAR  CLEAR Final  . Specific Gravity, Urine 07/24/2015 1.016  1.005 - 1.030 Final  . pH 07/24/2015 7.5  5.0 - 8.0 Final  . Glucose, UA 07/24/2015 NEGATIVE  NEGATIVE mg/dL Final  . Hgb urine dipstick 07/24/2015 LARGE* NEGATIVE Final  . Bilirubin Urine 07/24/2015 NEGATIVE  NEGATIVE Final  . Ketones, ur 07/24/2015 NEGATIVE  NEGATIVE mg/dL Final  . Protein, ur 07/24/2015 NEGATIVE  NEGATIVE mg/dL Final  . Nitrite 07/24/2015 NEGATIVE  NEGATIVE  Final  . Leukocytes, UA 07/24/2015 TRACE* NEGATIVE Final  . B Natriuretic Peptide 07/23/2015 61.2  0.0 - 100.0 pg/mL Final  . Squamous Epithelial / LPF 07/24/2015 0-5* NONE SEEN Final  . WBC, UA 07/24/2015 6-30  0 - 5 WBC/hpf Final  . RBC / HPF 07/24/2015 6-30  0 - 5 RBC/hpf Final  . Bacteria, UA 07/24/2015 RARE* NONE SEEN Final  . Specimen Description 07/24/2015 URINE, RANDOM   Final  . Special Requests 07/24/2015 Normal   Final  . Culture 07/24/2015 *  Final                   Value:20,000 COLONIES/mL ESCHERICHIA COLI 70,000 COLONIES/mL LACTOBACILLUS SPECIES Standardized susceptibility testing for this organism is not available.   Marland Kitchen  Report Status 07/24/2015 07/26/2015 FINAL   Final  . Organism ID, Bacteria 07/24/2015 ESCHERICHIA COLI*  Final  Admission on 06/23/2015, Discharged on 06/24/2015  Component Date Value Ref Range Status  . Sodium 06/23/2015 139  135 - 145 mmol/L Final  . Potassium 06/23/2015 3.7  3.5 - 5.1 mmol/L Final  . Chloride 06/23/2015 105  101 - 111 mmol/L Final  . CO2 06/23/2015 24  22 - 32 mmol/L Final  . Glucose, Bld 06/23/2015 123* 65 - 99 mg/dL Final  . BUN 06/23/2015 15  6 - 20 mg/dL Final  . Creatinine, Ser 06/23/2015 0.82  0.44 - 1.00 mg/dL Final  . Calcium 06/23/2015 8.7* 8.9 - 10.3 mg/dL Final  . GFR calc non Af Amer 06/23/2015 >60  >60 mL/min Final  . GFR calc Af Amer 06/23/2015 >60  >60 mL/min Final   Comment: (NOTE) The eGFR has been calculated using the CKD EPI equation. This calculation has not been validated in all clinical situations. eGFR's persistently <60 mL/min signify possible Chronic Kidney Disease.   . Anion gap 06/23/2015 10  5 - 15 Final  . WBC 06/23/2015 7.6  4.0 - 10.5 K/uL Final  . RBC 06/23/2015 4.08  3.87 - 5.11 MIL/uL Final  . Hemoglobin 06/23/2015 12.2  12.0 - 15.0 g/dL Final  . HCT 06/23/2015 36.7  36.0 - 46.0 % Final  . MCV 06/23/2015 90.0  78.0 - 100.0 fL Final  . MCH 06/23/2015 29.9  26.0 - 34.0 pg Final  . MCHC  06/23/2015 33.2  30.0 - 36.0 g/dL Final  . RDW 06/23/2015 14.7  11.5 - 15.5 % Final  . Platelets 06/23/2015 246  150 - 400 K/uL Final  . Color, Urine 06/24/2015 AMBER* YELLOW Final   BIOCHEMICALS MAY BE AFFECTED BY COLOR  . APPearance 06/24/2015 CLOUDY* CLEAR Final  . Specific Gravity, Urine 06/24/2015 1.023  1.005 - 1.030 Final  . pH 06/24/2015 6.0  5.0 - 8.0 Final  . Glucose, UA 06/24/2015 NEGATIVE  NEGATIVE mg/dL Final  . Hgb urine dipstick 06/24/2015 TRACE* NEGATIVE Final  . Bilirubin Urine 06/24/2015 SMALL* NEGATIVE Final  . Ketones, ur 06/24/2015 NEGATIVE  NEGATIVE mg/dL Final  . Protein, ur 06/24/2015 30* NEGATIVE mg/dL Final  . Nitrite 06/24/2015 NEGATIVE  NEGATIVE Final  . Leukocytes, UA 06/24/2015 NEGATIVE  NEGATIVE Final  . Squamous Epithelial / LPF 06/24/2015 0-5* NONE SEEN Final  . WBC, UA 06/24/2015 0-5  0 - 5 WBC/hpf Final  . RBC / HPF 06/24/2015 6-30  0 - 5 RBC/hpf Final  . Bacteria, UA 06/24/2015 RARE* NONE SEEN Final  . Casts 06/24/2015 HYALINE CASTS* NEGATIVE Final  . Urine-Other 06/24/2015 MUCOUS PRESENT   Final  . Specimen Description 06/24/2015 URINE, CATHETERIZED   Final  . Special Requests 06/24/2015 NONE   Final  . Culture 06/24/2015 NO GROWTH 1 DAY   Final  . Report Status 06/24/2015 06/25/2015 FINAL   Final  Clinical Support on 06/13/2015  Component Date Value Ref Range Status  . Pulse Generator Manufacturer 06/13/2015 BITR   Final  . Date Time Interrogation Session 06/13/2015 [id number]   Final  . Pulse Gen Model 06/13/2015 Evia DR-T   Final  . Pulse Gen Serial Number 06/13/2015 [id number]   Final  . Implantable Pulse Generator Type 06/13/2015 Implantable Pulse Generator   Final  . Implantable Pulse Generator Implan* 06/13/2015 [id number]   Final  . Implantable Lead Manufacturer 06/13/2015 SJCR   Final  . Implantable Lead Model 06/13/2015 Unknown   Final  .  Implantable Lead Serial Number 06/13/2015 unknown   Final  . Implantable Lead Implant  Date 06/13/2015 [id number]   Final  . Implantable Lead Location 06/13/2015 [id number]   Final  . Implantable Lead Manufacturer 06/13/2015 SJCR   Final  . Implantable Lead Model 06/13/2015 Unknown   Final  . Implantable Lead Serial Number 06/13/2015 unknown   Final  . Implantable Lead Implant Date 06/13/2015 [id number]   Final  . Implantable Lead Location 06/13/2015 [id number]   Final  . Lead Channel Setting Sensing Sensi* 06/13/2015 2.5   Final  . Lead Channel Setting Pacing Amplit* 06/13/2015 1.5   Final  . Lead Channel Setting Pacing Pulse * 06/13/2015 0.4   Final  . Lead Channel Setting Pacing Amplit* 06/13/2015 2.5   Final  . Lead Channel Impedance Value 06/13/2015 468   Final  . Lead Channel Sensing Intrinsic Amp* 06/13/2015 2.3   Final  . Lead Channel Pacing Threshold Ampl* 06/13/2015 0.7   Final  . Lead Channel Pacing Threshold Puls* 06/13/2015 0.4   Final  . Lead Channel Impedance Value 06/13/2015 566   Final  . Lead Channel Sensing Intrinsic Amp* 06/13/2015 14.5   Final  . Loletha Grayer Statistic RA Percent Paced 06/13/2015 45   Final  . Loletha Grayer Statistic RV Percent Paced 06/13/2015 0   Final  Office Visit on 06/02/2015  Component Date Value Ref Range Status  . Brain Natriuretic Peptide 06/02/2015 9.1  <100 pg/mL Final   Comment:   BNP levels increase with age in the general population with the highest values seen in individuals greater than 13 years of age. Reference: Joellyn Rued Cardiol 2002; 26:415-83.   ** Please note change in reference range(s). **     . Sodium 06/02/2015 138  135 - 146 mmol/L Final  . Potassium 06/02/2015 4.0  3.5 - 5.3 mmol/L Final  . Chloride 06/02/2015 101  98 - 110 mmol/L Final  . CO2 06/02/2015 24  20 - 31 mmol/L Final  . Glucose, Bld 06/02/2015 124* 65 - 99 mg/dL Final  . BUN 06/02/2015 12  7 - 25 mg/dL Final  . Creat 06/02/2015 0.80  0.60 - 0.88 mg/dL Final  . Calcium 06/02/2015 9.3  8.6 - 10.4 mg/dL Final  Admission on 05/30/2015, Discharged on 05/30/2015    Component Date Value Ref Range Status  . WBC 05/30/2015 8.4  4.0 - 10.5 K/uL Final  . RBC 05/30/2015 4.11  3.87 - 5.11 MIL/uL Final  . Hemoglobin 05/30/2015 12.8  12.0 - 15.0 g/dL Final  . HCT 05/30/2015 37.9  36.0 - 46.0 % Final  . MCV 05/30/2015 92.2  78.0 - 100.0 fL Final  . MCH 05/30/2015 31.1  26.0 - 34.0 pg Final  . MCHC 05/30/2015 33.8  30.0 - 36.0 g/dL Final  . RDW 05/30/2015 15.2  11.5 - 15.5 % Final  . Platelets 05/30/2015 269  150 - 400 K/uL Final  . Neutrophils Relative % 05/30/2015 59   Final  . Lymphocytes Relative 05/30/2015 30   Final  . Monocytes Relative 05/30/2015 8   Final  . Eosinophils Relative 05/30/2015 2   Final  . Basophils Relative 05/30/2015 1   Final  . Neutro Abs 05/30/2015 4.9  1.7 - 7.7 K/uL Final  . Lymphs Abs 05/30/2015 2.5  0.7 - 4.0 K/uL Final  . Monocytes Absolute 05/30/2015 0.7  0.1 - 1.0 K/uL Final  . Eosinophils Absolute 05/30/2015 0.2  0.0 - 0.7 K/uL Final  . Basophils Absolute 05/30/2015 0.1  0.0 - 0.1 K/uL Final  . WBC Morphology 05/30/2015 ATYPICAL LYMPHOCYTES   Final  . Smear Review 05/30/2015 LARGE PLATELETS PRESENT   Final  . Sodium 05/30/2015 140  135 - 145 mmol/L Final  . Potassium 05/30/2015 4.0  3.5 - 5.1 mmol/L Final  . Chloride 05/30/2015 104  101 - 111 mmol/L Final  . CO2 05/30/2015 26  22 - 32 mmol/L Final  . Glucose, Bld 05/30/2015 101* 65 - 99 mg/dL Final  . BUN 05/30/2015 8  6 - 20 mg/dL Final  . Creatinine, Ser 05/30/2015 0.72  0.44 - 1.00 mg/dL Final  . Calcium 05/30/2015 9.1  8.9 - 10.3 mg/dL Final  . Total Protein 05/30/2015 7.7  6.5 - 8.1 g/dL Final  . Albumin 05/30/2015 3.2* 3.5 - 5.0 g/dL Final  . AST 05/30/2015 17  15 - 41 U/L Final  . ALT 05/30/2015 14  14 - 54 U/L Final  . Alkaline Phosphatase 05/30/2015 74  38 - 126 U/L Final  . Total Bilirubin 05/30/2015 0.2* 0.3 - 1.2 mg/dL Final  . GFR calc non Af Amer 05/30/2015 >60  >60 mL/min Final  . GFR calc Af Amer 05/30/2015 >60  >60 mL/min Final   Comment:  (NOTE) The eGFR has been calculated using the CKD EPI equation. This calculation has not been validated in all clinical situations. eGFR's persistently <60 mL/min signify possible Chronic Kidney Disease.   . Anion gap 05/30/2015 10  5 - 15 Final  . Lipase 05/30/2015 23  11 - 51 U/L Final  . Lactic Acid, Venous 05/30/2015 1.60  0.5 - 2.0 mmol/L Final  . Lactic Acid, Venous 05/30/2015 1.66  0.5 - 2.0 mmol/L Final  Office Visit on 05/08/2015  Component Date Value Ref Range Status  . TSH 05/08/2015 4.70*  Final   Comment:   Reference Range   > or = 20 Years  0.40-4.50   Pregnancy Range First trimester  0.26-2.66 Second trimester 0.55-2.73 Third trimester  0.43-2.91   ** Please note change in unit of measure and reference range(s). **     . WBC 05/08/2015 7.8  4.0 - 10.5 K/uL Final  . RBC 05/08/2015 4.35  3.87 - 5.11 MIL/uL Final  . Hemoglobin 05/08/2015 13.6  12.0 - 15.0 g/dL Final  . HCT 05/08/2015 39.4  36.0 - 46.0 % Final  . MCV 05/08/2015 90.6  78.0 - 100.0 fL Final  . MCH 05/08/2015 31.3  26.0 - 34.0 pg Final  . MCHC 05/08/2015 34.5  30.0 - 36.0 g/dL Final  . RDW 05/08/2015 14.5  11.5 - 15.5 % Final  . Platelets 05/08/2015 289  150 - 400 K/uL Final  . MPV 05/08/2015 10.7  8.6 - 12.4 fL Final  . Brain Natriuretic Peptide 05/08/2015 11.8  <100 pg/mL Final   Comment:   BNP levels increase with age in the general population with the highest values seen in individuals greater than 78 years of age. Reference: Joellyn Rued Cardiol 2002; 40:102-72.   ** Please note change in reference range(s). **     . Sodium 05/08/2015 137  135 - 146 mmol/L Final  . Potassium 05/08/2015 4.0  3.5 - 5.3 mmol/L Final  . Chloride 05/08/2015 101  98 - 110 mmol/L Final  . CO2 05/08/2015 21  20 - 31 mmol/L Final  . Glucose, Bld 05/08/2015 114* 65 - 99 mg/dL Final  . BUN 05/08/2015 13  7 - 25 mg/dL Final  . Creat 05/08/2015 0.84  0.60 - 0.88  mg/dL Final  . Total Bilirubin 05/08/2015 0.3  0.2 - 1.2  mg/dL Final  . Alkaline Phosphatase 05/08/2015 95  33 - 130 U/L Final  . AST 05/08/2015 15  10 - 35 U/L Final  . ALT 05/08/2015 13  6 - 29 U/L Final  . Total Protein 05/08/2015 8.1  6.1 - 8.1 g/dL Final  . Albumin 05/08/2015 3.9  3.6 - 5.1 g/dL Final  . Calcium 05/08/2015 9.2  8.6 - 10.4 mg/dL Final    Dg Chest 2 View  07/23/2015  CLINICAL DATA:  Acute onset of near-syncope.  Initial encounter. EXAM: CHEST  2 VIEW COMPARISON:  Chest radiograph performed 06/23/2015 FINDINGS: The lungs are well-aerated. Mild vascular congestion is noted. There is no evidence of focal opacification, pleural effusion or pneumothorax. The heart is mildly enlarged. A pacemaker is noted at the left chest wall, with leads ending at the right atrium and right ventricle. No acute osseous abnormalities are seen. IMPRESSION: Mild vascular congestion and mild cardiomegaly noted. Lungs remain grossly clear. Electronically Signed   By: Garald Balding M.D.   On: 07/23/2015 23:30     Assessment/Plan   ICD-9-CM ICD-10-CM   1. Primary osteoarthritis involving multiple joints 715.09 M15.0   2. Paroxysmal atrial fibrillation (HCC) 427.31 I48.0   3. Essential hypertension, benign 401.1 I10   4. Visual hallucination 368.16 R44.1   5. Debility 799.3 R53.81   6. Dementia, without behavioral disturbance 294.20 F03.90    Recommend Adult Day care for socialization. Will complete paperwork once receive it.  Continue current medications as ordered  Follow up 1 month for routine visit   Jalyric Kaestner S. Perlie Gold  [facility] [address] [place], [place] [address] [phone] Cell (Monday-Friday 8 AM - 5 PM) [phone] After 5 PM and follow prompts

## 2015-10-05 ENCOUNTER — Telehealth: Payer: Self-pay | Admitting: Internal Medicine

## 2015-10-05 NOTE — Telephone Encounter (Signed)
Noted.  Will forward to Dr. Ross to inform. 

## 2015-10-05 NOTE — Telephone Encounter (Signed)
New Message:   She wanted to notify you that the patient will no longer be participating in [facility]. Pt son have requested this,due to the pt age.

## 2015-10-22 ENCOUNTER — Other Ambulatory Visit: Payer: Self-pay | Admitting: Internal Medicine

## 2015-10-31 ENCOUNTER — Telehealth: Payer: Self-pay | Admitting: *Deleted

## 2015-10-31 NOTE — Telephone Encounter (Signed)
Marcelino Duster with [facility] Dept. Called and stated that she will resend the plan of care for Dr. Montez Morita to sign so that patient can continue to receive nursing assistance Services. To be faxed back to Fax#: [phone]

## 2015-11-07 ENCOUNTER — Other Ambulatory Visit: Payer: Self-pay | Admitting: *Deleted

## 2015-11-07 MED ORDER — POLYETHYLENE GLYCOL 3350 17 GM/SCOOP PO POWD: 17.0000 g | Freq: Every day | ORAL | 3 refills | Status: AC

## 2015-11-07 NOTE — Telephone Encounter (Signed)
Patient son requested. Faxed to pharmacy.

## 2015-11-10 ENCOUNTER — Other Ambulatory Visit: Payer: Self-pay | Admitting: Internal Medicine

## 2015-11-12 ENCOUNTER — Other Ambulatory Visit: Payer: Self-pay | Admitting: Internal Medicine

## 2015-11-14 ENCOUNTER — Other Ambulatory Visit: Payer: Self-pay | Admitting: *Deleted

## 2015-11-14 DIAGNOSIS — R600 Localized edema: Secondary | ICD-10-CM

## 2015-11-14 DIAGNOSIS — I480 Paroxysmal atrial fibrillation: Secondary | ICD-10-CM

## 2015-11-14 DIAGNOSIS — K219 Gastro-esophageal reflux disease without esophagitis: Secondary | ICD-10-CM

## 2015-11-14 DIAGNOSIS — I10 Essential (primary) hypertension: Secondary | ICD-10-CM

## 2015-11-14 MED ORDER — FUROSEMIDE 20 MG PO TABS: 20.0000 mg | ORAL_TABLET | Freq: Every day | ORAL | 11 refills | Status: DC

## 2015-12-16 ENCOUNTER — Encounter (HOSPITAL_COMMUNITY): Payer: Self-pay | Admitting: Emergency Medicine

## 2015-12-16 ENCOUNTER — Emergency Department (HOSPITAL_COMMUNITY): Payer: Medicare Other

## 2015-12-16 ENCOUNTER — Encounter (HOSPITAL_COMMUNITY): Payer: Medicare Other

## 2015-12-16 ENCOUNTER — Observation Stay (HOSPITAL_COMMUNITY)
Admission: EM | Admit: 2015-12-16 | Discharge: 2015-12-18 | Disposition: A | Discharge status: Home Health | Payer: Medicare Other | Attending: Family Medicine | Admitting: Family Medicine

## 2015-12-16 DIAGNOSIS — K219 Gastro-esophageal reflux disease without esophagitis: Secondary | ICD-10-CM | POA: Diagnosis not present

## 2015-12-16 DIAGNOSIS — G20 Parkinson's disease: Secondary | ICD-10-CM | POA: Diagnosis not present

## 2015-12-16 DIAGNOSIS — R600 Localized edema: Secondary | ICD-10-CM

## 2015-12-16 DIAGNOSIS — M79605 Pain in left leg: Secondary | ICD-10-CM | POA: Diagnosis not present

## 2015-12-16 DIAGNOSIS — Z993 Dependence on wheelchair: Secondary | ICD-10-CM | POA: Diagnosis not present

## 2015-12-16 DIAGNOSIS — F0390 Unspecified dementia without behavioral disturbance: Secondary | ICD-10-CM

## 2015-12-16 DIAGNOSIS — Z79899 Other long term (current) drug therapy: Secondary | ICD-10-CM | POA: Diagnosis not present

## 2015-12-16 DIAGNOSIS — E785 Hyperlipidemia, unspecified: Secondary | ICD-10-CM | POA: Diagnosis not present

## 2015-12-16 DIAGNOSIS — Z8673 Personal history of transient ischemic attack (TIA), and cerebral infarction without residual deficits: Secondary | ICD-10-CM | POA: Insufficient documentation

## 2015-12-16 DIAGNOSIS — I5032 Chronic diastolic (congestive) heart failure: Secondary | ICD-10-CM | POA: Diagnosis not present

## 2015-12-16 DIAGNOSIS — I679 Cerebrovascular disease, unspecified: Secondary | ICD-10-CM

## 2015-12-16 DIAGNOSIS — I10 Essential (primary) hypertension: Secondary | ICD-10-CM | POA: Diagnosis not present

## 2015-12-16 DIAGNOSIS — I110 Hypertensive heart disease with heart failure: Secondary | ICD-10-CM | POA: Diagnosis not present

## 2015-12-16 DIAGNOSIS — Z6833 Body mass index (BMI) 33.0-33.9, adult: Secondary | ICD-10-CM | POA: Insufficient documentation

## 2015-12-16 DIAGNOSIS — Z7982 Long term (current) use of aspirin: Secondary | ICD-10-CM | POA: Insufficient documentation

## 2015-12-16 DIAGNOSIS — Z7901 Long term (current) use of anticoagulants: Secondary | ICD-10-CM | POA: Diagnosis not present

## 2015-12-16 DIAGNOSIS — I480 Paroxysmal atrial fibrillation: Secondary | ICD-10-CM | POA: Diagnosis not present

## 2015-12-16 DIAGNOSIS — R531 Weakness: Principal | ICD-10-CM

## 2015-12-16 DIAGNOSIS — M1990 Unspecified osteoarthritis, unspecified site: Secondary | ICD-10-CM | POA: Diagnosis not present

## 2015-12-16 DIAGNOSIS — R29898 Other symptoms and signs involving the musculoskeletal system: Secondary | ICD-10-CM | POA: Diagnosis not present

## 2015-12-16 DIAGNOSIS — E7800 Pure hypercholesterolemia, unspecified: Secondary | ICD-10-CM | POA: Insufficient documentation

## 2015-12-16 DIAGNOSIS — I639 Cerebral infarction, unspecified: Secondary | ICD-10-CM

## 2015-12-16 DIAGNOSIS — I6381 Other cerebral infarction due to occlusion or stenosis of small artery: Secondary | ICD-10-CM | POA: Diagnosis present

## 2015-12-16 DIAGNOSIS — M7989 Other specified soft tissue disorders: Secondary | ICD-10-CM

## 2015-12-16 DIAGNOSIS — M79662 Pain in left lower leg: Secondary | ICD-10-CM | POA: Diagnosis present

## 2015-12-16 DIAGNOSIS — M6281 Muscle weakness (generalized): Secondary | ICD-10-CM

## 2015-12-16 DIAGNOSIS — R5381 Other malaise: Secondary | ICD-10-CM | POA: Insufficient documentation

## 2015-12-16 LAB — VITAMIN B12: Vitamin B-12: 376 pg/mL (ref 180–914)

## 2015-12-16 LAB — CBC WITH DIFFERENTIAL/PLATELET
BASOS PCT: 0 %
Basophils Absolute: 0 10*3/uL (ref 0.0–0.1)
EOS ABS: 0.1 10*3/uL (ref 0.0–0.7)
Eosinophils Relative: 1 %
HCT: 37.8 % (ref 36.0–46.0)
HEMOGLOBIN: 12.4 g/dL (ref 12.0–15.0)
Lymphocytes Relative: 23 %
Lymphs Abs: 2 10*3/uL (ref 0.7–4.0)
MCH: 30.4 pg (ref 26.0–34.0)
MCHC: 32.8 g/dL (ref 30.0–36.0)
MCV: 92.6 fL (ref 78.0–100.0)
Monocytes Absolute: 1.1 10*3/uL — ABNORMAL HIGH (ref 0.1–1.0)
Monocytes Relative: 13 %
NEUTROS PCT: 63 %
Neutro Abs: 5.5 10*3/uL (ref 1.7–7.7)
Platelets: 259 10*3/uL (ref 150–400)
RBC: 4.08 MIL/uL (ref 3.87–5.11)
RDW: 15.3 % (ref 11.5–15.5)
WBC: 8.7 10*3/uL (ref 4.0–10.5)

## 2015-12-16 LAB — URINALYSIS, ROUTINE W REFLEX MICROSCOPIC
Glucose, UA: NEGATIVE mg/dL
Hgb urine dipstick: NEGATIVE
KETONES UR: NEGATIVE mg/dL
LEUKOCYTES UA: NEGATIVE
NITRITE: NEGATIVE
PH: 5.5 (ref 5.0–8.0)
Protein, ur: NEGATIVE mg/dL
SPECIFIC GRAVITY, URINE: 1.019 (ref 1.005–1.030)

## 2015-12-16 LAB — COMPREHENSIVE METABOLIC PANEL
ALT: 34 U/L (ref 14–54)
AST: 27 U/L (ref 15–41)
Albumin: 2.8 g/dL — ABNORMAL LOW (ref 3.5–5.0)
Alkaline Phosphatase: 64 U/L (ref 38–126)
Anion gap: 8 (ref 5–15)
BILIRUBIN TOTAL: 0.4 mg/dL (ref 0.3–1.2)
BUN: 11 mg/dL (ref 6–20)
CO2: 25 mmol/L (ref 22–32)
CREATININE: 0.86 mg/dL (ref 0.44–1.00)
Calcium: 8.9 mg/dL (ref 8.9–10.3)
Chloride: 105 mmol/L (ref 101–111)
GFR calc Af Amer: >60 mL/min (ref >60)
GFR, EST NON AFRICAN AMERICAN: 59 mL/min — AB (ref >60)
Glucose, Bld: 91 mg/dL (ref 65–99)
Potassium: 3.7 mmol/L (ref 3.5–5.1)
Sodium: 138 mmol/L (ref 135–145)
TOTAL PROTEIN: 6.6 g/dL (ref 6.5–8.1)

## 2015-12-16 LAB — I-STAT CHEM 8, ED
BUN: 12 mg/dL (ref 6–20)
CHLORIDE: 103 mmol/L (ref 101–111)
Calcium, Ion: 1.12 mmol/L — ABNORMAL LOW (ref 1.15–1.40)
Creatinine, Ser: 0.9 mg/dL (ref 0.44–1.00)
Glucose, Bld: 91 mg/dL (ref 65–99)
HEMATOCRIT: 40 % (ref 36.0–46.0)
Hemoglobin: 13.6 g/dL (ref 12.0–15.0)
POTASSIUM: 3.8 mmol/L (ref 3.5–5.1)
SODIUM: 141 mmol/L (ref 135–145)
TCO2: 27 mmol/L (ref 0–100)

## 2015-12-16 LAB — LIPID PANEL
CHOLESTEROL: 192 mg/dL (ref 0–200)
HDL: 45 mg/dL (ref >40)
LDL Cholesterol: 132 mg/dL — ABNORMAL HIGH (ref 0–99)
TRIGLYCERIDES: 76 mg/dL (ref <150)
Total CHOL/HDL Ratio: 4.3 RATIO
VLDL: 15 mg/dL (ref 0–40)

## 2015-12-16 LAB — I-STAT TROPONIN, ED: TROPONIN I, POC: 0 ng/mL (ref 0.00–0.08)

## 2015-12-16 LAB — TSH: TSH: 2.745 u[IU]/mL (ref 0.350–4.500)

## 2015-12-16 LAB — BRAIN NATRIURETIC PEPTIDE: B Natriuretic Peptide: 25.6 pg/mL (ref 0.0–100.0)

## 2015-12-16 MED ORDER — SODIUM CHLORIDE 0.9 % IV SOLN: INTRAVENOUS | Status: DC

## 2015-12-16 MED ORDER — POLYETHYLENE GLYCOL 3350 17 G PO PACK
17.0000 g | PACK | Freq: Every day | ORAL | Status: DC | PRN
  Administered 2015-12-17 – 2015-12-18 (×2): 17 g via ORAL
  Filled 2015-12-16 (×2): qty 1

## 2015-12-16 MED ORDER — PANTOPRAZOLE SODIUM 40 MG PO TBEC
40.0000 mg | DELAYED_RELEASE_TABLET | Freq: Every day | ORAL | Status: DC
  Administered 2015-12-16 – 2015-12-18 (×3): 40 mg via ORAL
  Filled 2015-12-16 (×3): qty 1

## 2015-12-16 MED ORDER — AMIODARONE HCL 200 MG PO TABS
200.0000 mg | ORAL_TABLET | Freq: Two times a day (BID) | ORAL | Status: DC
Start: 2015-12-16 — End: 2015-12-18
  Administered 2015-12-16 – 2015-12-18 (×4): 200 mg via ORAL
  Filled 2015-12-16 (×5): qty 1

## 2015-12-16 MED ORDER — POTASSIUM CHLORIDE CRYS ER 10 MEQ PO TBCR
10.0000 meq | EXTENDED_RELEASE_TABLET | Freq: Every day | ORAL | Status: DC
  Administered 2015-12-16 – 2015-12-18 (×3): 10 meq via ORAL
  Filled 2015-12-16 (×6): qty 1

## 2015-12-16 MED ORDER — ASPIRIN 300 MG RE SUPP: 300.0000 mg | Freq: Every day | RECTAL | Status: DC

## 2015-12-16 MED ORDER — HYDROCODONE-ACETAMINOPHEN 5-325 MG PO TABS
1.0000 | ORAL_TABLET | ORAL | Status: DC | PRN
  Administered 2015-12-16 (×2): 1 via ORAL
  Filled 2015-12-16 (×2): qty 1

## 2015-12-16 MED ORDER — APIXABAN 5 MG PO TABS
5.0000 mg | ORAL_TABLET | Freq: Two times a day (BID) | ORAL | Status: DC
  Administered 2015-12-16 – 2015-12-18 (×5): 5 mg via ORAL
  Filled 2015-12-16 (×5): qty 1

## 2015-12-16 MED ORDER — ASPIRIN 325 MG PO TABS
325.0000 mg | ORAL_TABLET | Freq: Every day | ORAL | Status: DC
  Administered 2015-12-16 – 2015-12-18 (×3): 325 mg via ORAL
  Filled 2015-12-16 (×3): qty 1

## 2015-12-16 MED ORDER — STROKE: EARLY STAGES OF RECOVERY BOOK
Freq: Once | Status: AC
  Administered 2015-12-16: 05:00:00
  Filled 2015-12-16: qty 1

## 2015-12-16 MED ORDER — ACETAMINOPHEN 500 MG PO TABS
1000.0000 mg | ORAL_TABLET | Freq: Four times a day (QID) | ORAL | Status: DC | PRN
  Administered 2015-12-16 – 2015-12-18 (×3): 1000 mg via ORAL
  Filled 2015-12-16 (×3): qty 2

## 2015-12-16 MED ORDER — FUROSEMIDE 20 MG PO TABS
40.0000 mg | ORAL_TABLET | Freq: Every day | ORAL | Status: DC
  Administered 2015-12-16 – 2015-12-18 (×3): 40 mg via ORAL
  Filled 2015-12-16 (×3): qty 2

## 2015-12-16 NOTE — ED Notes (Addendum)
Attempted to start x1.Floor RN informed that IV team will meet pt on the floor

## 2015-12-16 NOTE — Progress Notes (Signed)
Consult for PIV. Arrived at bedside. Attempted PIV x 1 unsuccessfully. Family member at bedside started  loudly addressing me "If you don't know what you are doing, stop sticking her unless you can see the vein." I agreed verbally and by a head nod. I then placed the tourniquet on once again to continue my assessment. The family member started getting louder and repeating the same instructions and then ordered "You, don't touch her any more." Pointing at me. I removed my tourniquet and removed my start kit from the bed and exited the room. As I was leaving the departrment, I could hear the family member still repeating the same instructions and continuing to get louder as Martha Clan RN was assessing.

## 2015-12-16 NOTE — Evaluation (Signed)
Occupational Therapy Evaluation and Discharge Patient Details Name: Tara Hughes MRN: [id number] DOB: [birth year] Today's Date: 12/16/2015    History of Present Illness Patient is a 82 y/o female with hx of HTN,. periperhal edema, PAF, dementia, CHF presents with LLE swelling and weakness. Head CT-Age indeterminate lacunar infarct in the right basal ganglia. Workup pending.   Clinical Impression   This 82 yo female admitted for above presents to acute OT with deficits below (see OT problem list) thus affecting her being able to A with any basic ADLs. She will benefit from continued OT at SNF to see if she can get back to a more mobile state for her son to be able to care for her at home. We will defer remainder of OT to SNF. Acute OT will sign off.    Follow Up Recommendations  SNF    Equipment Recommendations  None recommended by OT       Precautions / Restrictions Precautions Precautions: Fall              ADL                                         General ADL Comments: total A--per earlier report, pt can usually feed herself (she cannot currently--may be more due to positioning). Attempted to A her with hand over hand for washing her face with her RUE, but pt resisting as I tried to help her as elbow flexion increased.               Pertinent Vitals/Pain Pain Assessment: No/denies pain     Hand Dominance Right   Extremity/Trunk Assessment Upper Extremity Assessment Upper Extremity Assessment: RUE deficits/detail;LUE deficits/detail RUE Deficits / Details: Bil UEs very stiff and mildly edematous, pt can A with shoulder flexion/extension, elbow flexion/extension, hand open and close RUE Coordination: decreased fine motor;decreased gross motor LUE Deficits / Details: Bil UEs very stiff and mildly edematous, pt can A with shoulder flexion/extension, elbow flexion/extension, hand open and close LUE Coordination: decreased fine motor;decreased  gross motor           Communication Communication Communication:  (muffled speech at times)   Cognition Arousal/Alertness: Awake/alert Behavior During Therapy: Flat affect                   General Comments: Pt keeping eyes closed for much of session; responds to some questions but muffled speech at times. Followed commands for moving arms as I asked her to.              Home Living Family/patient expects to be discharged to:: Skilled nursing facility                                        Prior Functioning/Environment Level of Independence: Needs assistance  Gait / Transfers Assistance Needed: uses RW and someone follows or assists, assist to stand from elevated lift chair ADL's / Homemaking Assistance Needed: assist for bathing, dressing, toileting, son sets pt up to self feed with a tray in front of her chair and elevates it            OT Problem List: Decreased strength;Increased edema;Impaired UE functional use;Decreased cognition;Impaired balance (sitting and/or standing);Decreased range of motion      OT Goals(Current  goals can be found in the care plan section) Acute Rehab OT Goals Patient Stated Goal: pt did not state anything other than she did not want anymore to eat  OT Frequency:                End of Session    Activity Tolerance: Patient tolerated treatment well Patient left: in bed;with call bell/phone within reach;with bed alarm set   Time: [id number] OT Time Calculation (min): 10 min Charges:  OT Evaluation $OT Eval Moderate Complexity: 1 Procedure G-Codes: OT G-codes **NOT FOR INPATIENT CLASS** Functional Assessment Tool Used: clinical observation Functional Limitation: Self care Self Care Current Status (Y8657(G8987): 100 percent impaired, limited or restricted Self Care Goal Status (Q4696(G8988): 100 percent impaired, limited or restricted Self Care Discharge Status (E9528(G8989): 100 percent impaired, limited or restricted   Evette GeorgesLeonard, Lashia Niese Eva [phone] 12/16/2015, 2:36 PM

## 2015-12-16 NOTE — Consult Note (Signed)
Neurology Consult Note  Reason for Consultation: possible stroke on CT head with possible LLE weakness  Requesting provider: Odie Sera, MD  CC: "my heel hurts"  HPI: This is an 82 year old right-handed woman with long-standing history of dementia and difficulty walking who now presents to the emergency department with a gradual decline in her level of function over the past couple of weeks. History is obtained from her son with whom she lives. The patient is demented and has difficulty providing much information.  Her son reports that she was doing well until about 2 weeks ago. She has chronic difficulty with walking and uses either a walker or a wheelchair for her ambulation as she is unable to cannulate independently even for short distances. Her son also reports that she has a chronic tremor in the head and the right hand which is been unchanged for at least 2 years. Over the last couple of weeks, she has become less active. She has had difficulty transferring herself to her wheelchair. She complains of pain in her left leg, specifically in the left heel. Her son reports that she has had chronic swelling and pain in her left leg for over one year. In the emergency department, she had a CT scan of the head which was interpreted as showing an age indeterminant lacunar infarction in the right basal ganglia. Neurology is now consulted for further recommendations, and in part to help determine whether the CT findings may explain any of her current clinical presentation.  PMH:  Past Medical History:  Diagnosis Date  . Arthritis   . Chronic diastolic CHF (congestive heart failure) (HCC)    a. 08/2014: EF 65-70% with Grade 1 DD   . Dementia   . GERD (gastroesophageal reflux disease)   . High blood pressure   . High cholesterol   . Pacemaker-Biotronik 2006  . Paroxysmal atrial fibrillation (HCC) 09/13/2014  . PERIPHERAL EDEMA 09/13/2009   Qualifier: Diagnosis of  By: Jonny Ruiz MD, Len Blalock     PSH:   Past Surgical History:  Procedure Laterality Date  . ABDOMINAL HYSTERECTOMY    . back operation    . CHOLECYSTECTOMY    . INSERT / REPLACE / REMOVE PACEMAKER    . TONSILLECTOMY      Family history: Family History  Problem Relation Age of Onset  . Cancer Father   . Diabetes Brother   . Diverticulitis Sister   . Heart disease Sister     Social history:  Social History   Social History  . Marital status: Widowed    Spouse name: N/A  . Number of children: N/A  . Years of education: N/A   Occupational History  . Not on file.   Social History Main Topics  . Smoking status: Never Smoker  . Smokeless tobacco: Never Used  . Alcohol use No  . Drug use: No  . Sexual activity: Not on file   Other Topics Concern  . Not on file   Social History Narrative   Diet:      Do you drink/ eat things with caffeine? Coffee,soda      Marital status: Widow                              What year were you married ?      Do you live in a house, apartment,assistred living, condo, trailer, etc.)?Yes, House      Is it one or more  stories? 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                              Type & how often:      Do you have a living will? No      Do you have a DNR form?  No                      If not, do you want to discuss one? No      Do you have signed POA?HPOA forms? Yes                If so, please bring to your        appointment          Current outpatient meds: Current Meds  Medication Sig  . acetaminophen (TYLENOL) 500 MG tablet Take 1,000 mg by mouth every 6 (six) hours as needed for moderate pain.  Marland Kitchen amiodarone (PACERONE) 200 MG tablet Take 1 tablet (200 mg total) by mouth 2 (two) times daily.  Marland Kitchen apixaban (ELIQUIS) 5 MG TABS tablet Take 1 tablet (5 mg total) by mouth 2 (two) times daily.  . furosemide (LASIX)  20 MG tablet Take 1-2 tablets (20-40 mg total) by mouth daily. Take 20 mg twice daily on Mon / Wed / Fri (Patient taking differently: Take 20-40 mg by mouth daily. Take 40 mg twice daily on Mon / Wed / Fri and 20mg  on Tuesday and thursday)  . omeprazole (PRILOSEC) 40 MG capsule Take 1 capsule (40 mg total) by mouth daily.  . polyethylene glycol powder (GLYCOLAX/MIRALAX) powder Take 17 g by mouth daily. Dissolve on capful of powder into any fluid and drink once daily. May increase to two times daily if no effect in 3 days. (Patient taking differently: Take 17 g by mouth daily as needed for mild constipation. Dissolve on capful of powder into any fluid and drink once daily. May increase to two times daily if no effect in 3 days.)  . potassium chloride (K-DUR) 10 MEQ tablet Take 2 tablets (20 mEq total) by mouth 3 (three) times a week. (Patient taking differently: Take 10 mEq by mouth See admin instructions. Take 1 tablet twice daily on Monday, Wednesday and Friday)    Current inpatient meds:  Current Facility-Administered Medications  Medication Dose Route Frequency Provider Last Rate Last Dose  .  stroke: mapping our early stages of recovery book   Does not apply Once Breaker Springer S Opyd, MD      . 0.9 %  sodium chloride infusion   Intravenous Continuous Lavone Neri Opyd, MD      . acetaminophen (TYLENOL) tablet 1,000 mg  1,000 mg Oral Q6H PRN Briscoe Deutscher, MD      . amiodarone (PACERONE) tablet 200 mg  200 mg Oral BID Lavone Neri Opyd, MD      . apixaban (ELIQUIS) tablet 5 mg  5 mg Oral BID Lavone Neri Opyd, MD      . aspirin suppository 300 mg  300 mg Rectal Daily Briscoe Deutscher, MD       Or  . aspirin tablet 325 mg  325  mg Oral Daily Lavone Neri Opyd, MD      . furosemide (LASIX) tablet 40 mg  40 mg Oral Daily Briscoe Deutscher, MD      . HYDROcodone-acetaminophen (NORCO/VICODIN) 5-325 MG per tablet 1-2 tablet  1-2 tablet Oral Q4H PRN Briscoe Deutscher, MD      . pantoprazole (PROTONIX) EC tablet 40 mg  40 mg  Oral Daily Kairy Folsom S Opyd, MD      . polyethylene glycol powder (GLYCOLAX/MIRALAX) container 17 g  17 g Oral Daily PRN Lavone Neri Opyd, MD      . potassium chloride (K-DUR) CR tablet 10 mEq  10 mEq Oral Daily Briscoe Deutscher, MD       Current Outpatient Prescriptions  Medication Sig Dispense Refill  . acetaminophen (TYLENOL) 500 MG tablet Take 1,000 mg by mouth every 6 (six) hours as needed for moderate pain.    Marland Kitchen amiodarone (PACERONE) 200 MG tablet Take 1 tablet (200 mg total) by mouth 2 (two) times daily. 60 tablet 11  . apixaban (ELIQUIS) 5 MG TABS tablet Take 1 tablet (5 mg total) by mouth 2 (two) times daily. 60 tablet 11  . furosemide (LASIX) 20 MG tablet Take 1-2 tablets (20-40 mg total) by mouth daily. Take 20 mg twice daily on Mon / Wed / Fri (Patient taking differently: Take 20-40 mg by mouth daily. Take 40 mg twice daily on Mon / Wed / Fri and 20mg  on Tuesday and thursday) 40 tablet 11  . omeprazole (PRILOSEC) 40 MG capsule Take 1 capsule (40 mg total) by mouth daily. 30 capsule 11  . polyethylene glycol powder (GLYCOLAX/MIRALAX) powder Take 17 g by mouth daily. Dissolve on capful of powder into any fluid and drink once daily. May increase to two times daily if no effect in 3 days. (Patient taking differently: Take 17 g by mouth daily as needed for mild constipation. Dissolve on capful of powder into any fluid and drink once daily. May increase to two times daily if no effect in 3 days.) 255 g 3  . potassium chloride (K-DUR) 10 MEQ tablet Take 2 tablets (20 mEq total) by mouth 3 (three) times a week. (Patient taking differently: Take 10 mEq by mouth See admin instructions. Take 1 tablet twice daily on Monday, Wednesday and Friday) 28 tablet 11    Allergies: No Known Allergies  ROS: As per HPI. A full 14-point review of systems was performed And is limited by poor attention and her dementia. However, she does report multifocal pain in her hands, right knee, and both heels.  PE:  BP  141/72   Pulse 72   Temp 98.4 F (36.9 C)   Resp 15   SpO2 96%   General: Obese African-American woman, sitting on ED gurney with chin resting against her chest. She is drooling. She has mild dysarthria. No aphasia. She is oriented to self and hospital, disoriented to time. Follows commands briskly. Affect is bright with congruent mood. Comportment is normal.  HEENT: Normocephalic. Neck supple without LAD. MMM, OP clear. She is edentulous. Sclerae anicteric. No conjunctival injection.  CV: Regular, no murmur. Carotid pulses full and symmetric, no bruits. Distal pulses 2+ and symmetric.  Lungs: CTAB.  Abdomen: Soft, obese non-distended, non-tender. Bowel sounds present x4.  Extremities: No C/C. she has mild pitting edema in the right lower extremity. Significant edema is present in the left lower extremity. Neuro:  CN: Pupils are equal and round. They are symmetrically reactive from 3-->2 mm. EOMI  with breakup of smooth pursuits, no nystagmus. No reported diplopia. Facial sensation is intact to light touch. Face is symmetric at rest with normal strength and mobility. Hearing is intact to conversational voice. Palate elevates symmetrically and uvula is midline. Voice is normal in tone, pitch and quality. Bilateral SCM and trapezii are 5/5. Tongue is midline with normal bulk and mobility.  Motor: Normal bulk and strength in the arms. She has a resting tremor in the right upper extremity with increased tone in both arms, right more than left. She has limited effort with strength testing in the lower extremities. Some of this may be positional in nature. She is able to lift both legs off of the bed and suspend them unsupported for about 3 seconds. Distal strength is 5/5 in the right lower extremity, 4/5 in the left lower extremity. Sensation: Intact to light touch but effort is variable. DTRs: 3+, symmetric. Toes mute bilaterally.  Coordination: Finger-to-nose is without dysmetria. Heel-to-shin is not  performed as the patient complains of pain. Finger taps are slow with some decrement noted on the right.    Labs:  Lab Results  Component Value Date   WBC 8.7 12/16/2015   HGB 13.6 12/16/2015   HCT 40.0 12/16/2015   PLT 259 12/16/2015   GLUCOSE 91 12/16/2015   CHOL 174 11/23/2014   TRIG 100 11/23/2014   HDL 48 11/23/2014   LDLCALC 106 (H) 11/23/2014   ALT 34 12/16/2015   AST 27 12/16/2015   NA 138 12/16/2015   K 3.7 12/16/2015   CL 105 12/16/2015   CREATININE 0.86 12/16/2015   BUN 11 12/16/2015   CO2 25 12/16/2015   TSH 4.70 (H) 05/08/2015   INR 1.1 12/09/2008    Imaging:  I have personally and independently reviewed CT scan of the head without contrast from today. This shows a focal area of hypodensity in the right thalamus consistent with a chronic lacunar infarction. This scan is somewhat limited by beam hardening artifact. When compared to a prior CT scan from 06/23/15, this same hypodensity is present. It is most easily appreciated on image #17 on series 201 and image #20 on series 203.  Assessment and Plan:  1. LE weakness: Sounds as if she has had a general functional decline over the past couple of weeks. There's been some concern that she has focal weakness in the left lower extremity. However, her examination is clouded by poor effort and the fact that she has significant edema in the left lower extremity. While she does appear to have an old lacunar infarction in the right thalamus, I don't think this is new as it was present in March 2017. It would not be likely to explain her symptoms. At this point, the etiology for her functional decline remains unclear though it does not appear to be primarily neurologic in origin. Further workup of her left lower extremity edema is planned. Given her right upper extremity tremor and increased tone in the upper extremity is, she appears to have some parkinsonism. I was unable to evaluate her gait due to her pain and the fact that she has  not ambulatory and independently for at least a couple of years. However, Parkinson's remains a concern. Idiopathic Parkinson's disease would seem less likely, however, given the son reports her tremor has been unchanged for 2 years. Unfortunately, she cannot have MRI scan of the brain because she has a pacemaker in place. I do not think any other imaging would be of benefit.  She may benefit from formal physical therapy consultation.  2. Cerebrovascular disease: Imaging shows evidence of chronic small vessel disease and lacunar infarct in the right thalamus. She is presently on Eliquis and this should be continued. Ensure optimal control of blood pressure, lipids, and glucose as needed.  This was discussed with the patient's son at length. He was given the opportunity to ask questions and these were addressed to his satisfaction.

## 2015-12-16 NOTE — Evaluation (Signed)
Physical Therapy Evaluation Patient Details Name: Tara Hughes MRN: [id number] DOB: [birth year] Today's Date: 12/16/2015   History of Present Illness  Patient is a 82 y/o female with hx of HTN,. periperhal edema, PAF, dementia, CHF presents with LLE swelling and weakness. Head CT-Age indeterminate lacunar infarct in the right basal ganglia. Workup pending.  Clinical Impression  Patient presents with lethargy, generalized weakness, BLE stiffness, decreased initiation and impaired mobility s/p above. Per son, pt needs assist with all care and to stand from lift chair but then able to ambulate Mod I with RW. However, during eval today, pt requires total A to get to EOB and assist of 2 to stand and not able to initiate gait due to weakness. Son is not able to care for pt at this level. Would benefit from short term SNF to maximize independence and mobility prior to return home. Will follow.    Follow Up Recommendations SNF    Equipment Recommendations  None recommended by PT    Recommendations for Other Services OT consult     Precautions / Restrictions Precautions Precautions: Fall Restrictions Weight Bearing Restrictions: No      Mobility  Bed Mobility Overal bed mobility: Needs Assistance Bed Mobility: Supine to Sit     Supine to sit: Total assist;+2 for physical assistance;HOB elevated     General bed mobility comments: No initiation of movement despite max cues and increased time to perform. Assist with BLEs and trunk to get to EOB.   Transfers Overall transfer level: Needs assistance Equipment used: Rolling walker (2 wheeled) Transfers: Sit to/from Stand Sit to Stand: Max assist;+2 physical assistance;From elevated surface         General transfer comment: Assist of 2 to power up to standing- posterior lean through hips and BLEs in extension. Not able to stand upright despite cues for hip extension.  Ambulation/Gait                Stairs             Wheelchair Mobility    Modified Rankin (Stroke Patients Only) Modified Rankin (Stroke Patients Only) Pre-Morbid Rankin Score: Moderately severe disability Modified Rankin: Severe disability     Balance Overall balance assessment: Needs assistance Sitting-balance support: Feet supported Sitting balance-Leahy Scale: Poor Sitting balance - Comments: Pt requires Mod A for sitting balance due to posterior lean.    Standing balance support: During functional activity Standing balance-Leahy Scale: Zero Standing balance comment: Requires BUE support and Mod A of 2 fot static standing balance. Able to minimally clear right foot but difficulty with left.                              Pertinent Vitals/Pain Pain Assessment: Faces Faces Pain Scale: No hurt    Home Living Family/patient expects to be discharged to:: Private residence Living Arrangements: Children Available Help at Discharge: Family;Available 24 hours/day Type of Home: House Home Access: Ramped entrance;Stairs to enter     Home Layout: One level Home Equipment: Walker - 2 wheels;Bedside commode;Tub bench;Wheelchair - manual Additional Comments: pt sleeps in lift chair    Prior Function Level of Independence: Needs assistance   Gait / Transfers Assistance Needed: uses RW and someone follows or assists, assist to stand from elevated lift chair  ADL's / Homemaking Assistance Needed: assist for bathing, dressing, toileting, son sets pt up to self feed with a tray in front of her chair and  elevates it        Hand Dominance   Dominant Hand: Right    Extremity/Trunk Assessment   Upper Extremity Assessment: Defer to OT evaluation           Lower Extremity Assessment: RLE deficits/detail;LLE deficits/detail;Difficult to assess due to impaired cognition;Generalized weakness (Stiffness/rigidity noted throughout BLEs; positioned into knee extension in standing.)   LLE Deficits / Details: Swelling LLE-  son reports as chronic     Communication   Communication: No difficulties  Cognition Arousal/Alertness: Lethargic Behavior During Therapy: Flat affect Overall Cognitive Status: Difficult to assess (Able to state yes to being in the hospital, knows it is [facility] and able to state September. ) Area of Impairment: Problem solving;Following commands       Following Commands: Follows one step commands inconsistently     Problem Solving: Decreased initiation;Slow processing;Requires verbal cues;Requires tactile cues;Difficulty sequencing General Comments: Pt keeping eyes closed for much of session; responds to some questions but muffled speech at times.     General Comments General comments (skin integrity, edema, etc.): Son present during session. RUE resting tremor.    Exercises     Assessment/Plan    PT Assessment Patient needs continued PT services  PT Problem List Decreased strength;Decreased mobility;Decreased activity tolerance;Decreased cognition;Decreased balance          PT Treatment Interventions Gait training;Therapeutic activities;Therapeutic exercise;Functional mobility training;Neuromuscular re-education;Balance training;Wheelchair mobility training;Patient/family education    PT Goals (Current goals can be found in the Care Plan section)  Acute Rehab PT Goals Patient Stated Goal: none stated; son wants her to be able to get up and walk on her own again PT Goal Formulation: With patient/family Time For Goal Achievement: 12/30/15 Potential to Achieve Goals: Fair    Frequency Min 2X/week   Barriers to discharge Decreased caregiver support      Co-evaluation               End of Session Equipment Utilized During Treatment: Gait belt Activity Tolerance: Patient limited by lethargy Patient left: in bed;with call bell/phone within reach;with family/visitor present;with bed alarm set Nurse Communication: Mobility status;Need for lift equipment     Functional Assessment Tool Used: clinical judgment Functional Limitation: Mobility: Walking and moving around Mobility: Walking and Moving Around Current Status [phone](G8978): At least 80 percent but less than 100 percent impaired, limited or restricted Mobility: Walking and Moving Around Goal Status [phone](G8979): At least 40 percent but less than 60 percent impaired, limited or restricted    Time: 0937-1008 PT Time Calculation (min) (ACUTE ONLY): 31 min   Charges:   PT Evaluation $PT Eval Moderate Complexity: 1 Procedure PT Treatments $Therapeutic Activity: 8-22 mins   PT G Codes:   PT G-Codes **NOT FOR INPATIENT CLASS** Functional Assessment Tool Used: clinical judgment Functional Limitation: Mobility: Walking and moving around Mobility: Walking and Moving Around Current Status (U9811(G8978): At least 80 percent but less than 100 percent impaired, limited or restricted Mobility: Walking and Moving Around Goal Status [phone](G8979): At least 40 percent but less than 60 percent impaired, limited or restricted    Josceline Chenard A Jessiah Wojnar 12/16/2015, 10:38 AM Mylo RedShauna Conchita Truxillo, PT, DPT [phone]

## 2015-12-16 NOTE — ED Triage Notes (Signed)
Patient arrived to ED via [facility]. EMS reports: called to son's home. C/o generalized weakness. Son reports patient has not been herself for a couple of weeks, and then reports patient has not been normal since breakfast. Reports at approx 1600, patient refused to transfer from chair to chair. Son reports patient not using her extremities.  EMS reports bilateral equal grips. Patient reports tingling in L arm - new symptom. Pain in R hand - normal for patient. Patient also has tremors, and is alert & oriented to self and place, but not to time or situation - normal for patient. Patient is on Eliquis. No recent falls.  EMS reports pacemaker active on some strips, and not working on others.  BP 146/64, Pulse 70's. CBG 105

## 2015-12-16 NOTE — ED Notes (Signed)
Admitting physician at bedside at this time.

## 2015-12-16 NOTE — ED Notes (Signed)
Pt is alert and oriented x3. Pt is unable to lift her left leg. This RN lifted the pt left leg and she is able to keep her leg elevated. Pt able to lift leg on R side. Hands grips are equal.  Smile is equal. Pt keeps her eyes closed but will open them on command.

## 2015-12-16 NOTE — H&P (Signed)
History and Physical    Tara Hughes ZOX:[id number] DOB: 03-Oct-1925 DOA: 12/16/2015  PCP: [name], DO   Patient coming from: Home   Chief Complaint: Gen weakness, left leg swelling and pain  HPI: Tara Hughes is a 82 y.o. female with medical history significant for paroxysmal atrial fibrillation on Eliquis, chronic diastolic CHF, dementia, hypertension, and GERD who presents to the emergency department for evaluation of generalized weakness with pain and swelling of the left leg. The patient has difficulty providing a history due to her underlying dementia, and history is augmented by discussion with the ED personnel, family members at the bedside, and review of the EMR. Patient lives with her son and had been in her usual state of health until approximately 2 weeks ago when her son noted the insidious development of decreased activity level and apparent generalized weakness. This had seemingly progressed over the past couple days and she has been either unwilling or unable to move the left lower extremity. The left leg is swollen and the patient has reportedly complained of pain at the left leg. Per review of the EMR, there appears to be chronic pain and swelling involving the left leg and a venous Doppler was negative for DVT in April 2016. There has been no apparent fever, no cough, and no apparent respiratory distress. The patient has not voiced any chest pain complaints or complained of headache. There has been no recent trauma and no recent change in her medications.  ED Course: Upon arrival to the ED, patient is found to be afebrile, saturating well on room air, and with vital signs stable. EKG demonstrates a sinus rhythm with QTc of 556 ms. Chest x-ray is negative for acute cardiopulmonary disease. BMP is unremarkable and CBC is entirely within normal limits. BNP and troponin both returned normal and urinalysis is unremarkable. Noncontrast head CT was obtained and is notable for  an age-indeterminate lacunar infarct in the right basal ganglia with the study being otherwise negative for acute intracranial abnormality. Patient remained hemodynamically stable in the emergency department and in no apparent respiratory distress. She'll be observed on the telemetry unit for ongoing evaluation and management of generalized weakness, left leg pain and swelling, and newly identified lacunar infarct in the right basal ganglia.  Review of Systems:  Unable to obtain ROS secondary to the patient's clinical condition with dementia.  Past Medical History:  Diagnosis Date  . Arthritis   . Chronic diastolic CHF (congestive heart failure) (HCC)    a. 08/2014: EF 65-70% with Grade 1 DD   . Dementia   . GERD (gastroesophageal reflux disease)   . High blood pressure   . High cholesterol   . Pacemaker-Biotronik 2006  . Paroxysmal atrial fibrillation (HCC) 09/13/2014  . PERIPHERAL EDEMA 09/13/2009   Qualifier: Diagnosis of  By: Jonny Ruiz MD, Len Blalock     Past Surgical History:  Procedure Laterality Date  . ABDOMINAL HYSTERECTOMY    . back operation    . CHOLECYSTECTOMY    . INSERT / REPLACE / REMOVE PACEMAKER    . TONSILLECTOMY       reports that she has never smoked. She has never used smokeless tobacco. She reports that she does not drink alcohol or use drugs.  No Known Allergies  Family History  Problem Relation Age of Onset  . Cancer Father   . Diabetes Brother   . Diverticulitis Sister   . Heart disease Sister      Prior to Admission medications  Medication Sig Start Date End Date Taking? Authorizing Provider  acetaminophen (TYLENOL) 500 MG tablet Take 1,000 mg by mouth every 6 (six) hours as needed for moderate pain.   Yes Historical Provider, MD  amiodarone (PACERONE) 200 MG tablet Take 1 tablet (200 mg total) by mouth 2 (two) times daily. 06/02/15  Yes Pricilla Riffle, MD  apixaban (ELIQUIS) 5 MG TABS tablet Take 1 tablet (5 mg total) by mouth 2 (two) times daily. 06/02/15   Yes Pricilla Riffle, MD  furosemide (LASIX) 20 MG tablet Take 1-2 tablets (20-40 mg total) by mouth daily. Take 20 mg twice daily on Mon / Wed / Fri Patient taking differently: Take 20-40 mg by mouth daily. Take 40 mg twice daily on Mon / Wed / Fri and 20mg  on Tuesday and thursday 11/14/15  Yes Pricilla Riffle, MD  omeprazole (PRILOSEC) 40 MG capsule Take 1 capsule (40 mg total) by mouth daily. 06/02/15  Yes Pricilla Riffle, MD  polyethylene glycol powder (GLYCOLAX/MIRALAX) powder Take 17 g by mouth daily. Dissolve on capful of powder into any fluid and drink once daily. May increase to two times daily if no effect in 3 days. Patient taking differently: Take 17 g by mouth daily as needed for mild constipation. Dissolve on capful of powder into any fluid and drink once daily. May increase to two times daily if no effect in 3 days. 11/07/15  Yes [name], DO  potassium chloride (K-DUR) 10 MEQ tablet Take 2 tablets (20 mEq total) by mouth 3 (three) times a week. Patient taking differently: Take 10 mEq by mouth See admin instructions. Take 1 tablet twice daily on Monday, Wednesday and Friday 06/02/15  Yes Pricilla Riffle, MD    Physical Exam: Vitals:   12/16/15 0045 12/16/15 0115 12/16/15 0130 12/16/15 0200  BP: 124/96 144/67 150/65 154/72  Pulse: 74 68 79 73  Resp: 16 20 15 18   Temp:      TempSrc:      SpO2: 100% 100% 100% 99%      Constitutional: NAD, calm, comfortable Eyes: PERTLA, lids and conjunctivae normal ENMT: Mucous membranes are moist. Posterior pharynx clear of any exudate or lesions.   Neck: normal, supple, no masses, no thyromegaly Respiratory: clear to auscultation bilaterally, no wheezing, no crackles. Normal respiratory effort.   Cardiovascular: S1 & S2 heard, regular rate and rhythm, no significant murmur. Bilateral pretibial edema Lt >> Rt. No carotid bruits.   Abdomen: No distension, no tenderness, no masses palpated. Bowel sounds normal.  Musculoskeletal: no clubbing / cyanosis.  Marked edema to LLE; old surgical scars overly knee. Normal muscle tone.  Skin: no significant rashes, lesions, ulcers. Warm, dry, well-perfused. Neurologic: No facial asymmetry, PERRL, EOMI, grip strength 5/5 and symmetric, sensation to light touch intact in all distributions of CN5 and distal extremities x4. Speech is fluent. Strength testing in LE's limited by pain or pt effort, or by true weakness.  Psychiatric: Alert, knows name and that she is in hospital; not oriented to month or year. Normal mood and affect.     Labs on Admission: I have personally reviewed following labs and imaging studies  CBC:  Recent Labs Lab 12/16/15 0022 12/16/15 0046  WBC 8.7  --   NEUTROABS 5.5  --   HGB 12.4 13.6  HCT 37.8 40.0  MCV 92.6  --   PLT 259  --    Basic Metabolic Panel:  Recent Labs Lab 12/16/15 0046  NA 141  K 3.8  CL 103  GLUCOSE 91  BUN 12  CREATININE 0.90   GFR: CrCl cannot be calculated (Unknown ideal weight.). Liver Function Tests: No results for input(s): AST, ALT, ALKPHOS, BILITOT, PROT, ALBUMIN in the last 168 hours. No results for input(s): LIPASE, AMYLASE in the last 168 hours. No results for input(s): AMMONIA in the last 168 hours. Coagulation Profile: No results for input(s): INR, PROTIME in the last 168 hours. Cardiac Enzymes: No results for input(s): CKTOTAL, CKMB, CKMBINDEX, TROPONINI in the last 168 hours. BNP (last 3 results) No results for input(s): PROBNP in the last 8760 hours. HbA1C: No results for input(s): HGBA1C in the last 72 hours. CBG: No results for input(s): GLUCAP in the last 168 hours. Lipid Profile: No results for input(s): CHOL, HDL, LDLCALC, TRIG, CHOLHDL, LDLDIRECT in the last 72 hours. Thyroid Function Tests: No results for input(s): TSH, T4TOTAL, FREET4, T3FREE, THYROIDAB in the last 72 hours. Anemia Panel: No results for input(s): VITAMINB12, FOLATE, FERRITIN, TIBC, IRON, RETICCTPCT in the last 72 hours. Urine analysis:      Component Value Date/Time   COLORURINE YELLOW 12/16/2015 0022   APPEARANCEUR CLOUDY (A) 12/16/2015 0022   LABSPEC 1.019 12/16/2015 0022   PHURINE 5.5 12/16/2015 0022   GLUCOSEU NEGATIVE 12/16/2015 0022   HGBUR NEGATIVE 12/16/2015 0022   BILIRUBINUR SMALL (A) 12/16/2015 0022   KETONESUR NEGATIVE 12/16/2015 0022   PROTEINUR NEGATIVE 12/16/2015 0022   UROBILINOGEN 1.0 06/29/2009 1145   NITRITE NEGATIVE 12/16/2015 0022   LEUKOCYTESUR NEGATIVE 12/16/2015 0022   Sepsis Labs: @LABRCNTIP (procalcitonin:4,lacticidven:4) )No results found for this or any previous visit (from the past 240 hour(s)).   Radiological Exams on Admission: Dg Chest 2 View  Result Date: 12/16/2015 CLINICAL DATA:  Generalized weakness, of indeterminate chronicity. Initial encounter. EXAM: CHEST  2 VIEW COMPARISON:  Chest radiograph performed 07/23/2015 FINDINGS: The lungs are relatively well-aerated and clear. There is no evidence of focal opacification, pleural effusion or pneumothorax. Nodular opacity at the left midlung zone appears to reflect normal vasculature, on correlation with the prior study. The heart is normal in size; the mediastinal contour is within normal limits. A pacemaker is noted at the left chest wall, with leads ending at the right atrium and right ventricle. No acute osseous abnormalities are seen. Clips are noted within the right upper quadrant, reflecting prior cholecystectomy. IMPRESSION: No acute cardiopulmonary process seen. Electronically Signed   By: Roanna RaiderJeffery  Chang M.D.   On: 12/16/2015 02:52   Ct Head Wo Contrast  Result Date: 12/16/2015 CLINICAL DATA:  Weakness and fatigue. EXAM: CT HEAD WITHOUT CONTRAST TECHNIQUE: Contiguous axial images were obtained from the base of the skull through the vertex without intravenous contrast. COMPARISON:  Head CT 06/23/2015 FINDINGS: Brain: No evidence of acute infarction, hemorrhage, hydrocephalus, extra-axial collection or mass lesion/mass effect. Small  lacunar infarct in the right basal ganglia, age-indeterminate. Mild normal for age chronic small vessel ischemia. Under artifact of the frontal lobes. Vascular: No hyperdense vessel or unexpected calcification. Atherosclerosis of skullbase vasculature. Skull: Normal. Negative for fracture or focal lesion. Sinuses/Orbits: No acute finding. IMPRESSION: Age indeterminate lacunar infarct in the right basal ganglia. There is otherwise no acute intracranial abnormality. Electronically Signed   By: Rubye OaksMelanie  Ehinger M.D.   On: 12/16/2015 03:44    EKG: Independently reviewed. Sinus rhythm, QTc 556 ms  Assessment/Plan  1. Debility and decline - Likely multifactorial with deconditioning, dementia, possible contribution from CVA  - Basic blood work remarkably normal; will check TSH, b12, folate  - PT, OT  evals requested    2. Right basal ganglia lacunar infarct - Age-indeterminate infarct  - Neurology has consulted and is much appreciated; the consultant was able to appreciate this lesion on the prior CT from March  - Further eval with MRI precluded by pacer  - Continue Eliquis   3. Left leg pain and swelling - Chronic problem per chart notes going back years  - Will check venous US, though was negative in April 2016  - Pain-control with prn's  - PT, OT evals requested    4. Paroxysmal atrial fibrillation  - In a sinus rhythm on admission - CHADS-VASc at least 39 (age x2, gender, CVA x2, HTN, CHF)  - Continue Eliquis and amiodarone  5. Hypertension - At goal currently  - Continue Lasix    6. GERD - Stable, managed with Prilosec at home, will continue PPI tx    7. Chronic diastolic CHF  - BNP wnl and CXR clear on admission; BLE edema noted  - TTE (09/13/14) with EF 65-70%, mild concentric hypertrophy, grade 1 diastolic dysfunction, no wall-motion abnormality or significant valvular disease  - Continue Lasix 80 qD, SLIV, follow daily wts and I/O's     DVT prophylaxis: Eliquis Code Status:  Full  Family Communication: Aunt and son updated at bedside  Disposition Plan: Observe on telemetry Consults called: Neurology   Admission status: Observation    Briscoe Deutscher, MD [facility] Pager [phone]  If 7PM-7AM, please contact night-coverage [email or web address] Password [email or web address]  12/16/2015, 4:18 AM

## 2015-12-16 NOTE — Progress Notes (Signed)
Progress note  Tara Hughes is a 82 y.o. female with medical history significant for paroxysmal atrial fibrillation on Eliquis, chronic diastolic CHF, dementia, hypertension, and GERD who presents to the emergency department for evaluation of generalized weakness with pain and swelling of the left leg. Patient lives with her son and had been in her usual state of health until approximately 2 weeks ago when her son noted the insidious development of decreased activity level and apparent generalized weakness.   I spoke with patient's son this morning. He states that she has had chronic bilateral lower extremity edema as well as left lower leg pain for months, dating back to January 2017. She was hospitalized in January 2017 and was diagnosed with heart failure and was discharged with Lasix. He states that her PCP has been managing her diuretic dosage. Over the last 2 weeks, patient has been less ambulatory. At baseline, patient is able to ambulate with a walker and one person assist. He denies any acute changes to her left leg swelling or pain from baseline.   This morning, patient is sleepy, but alert and able to answer questions. She admits to chronic pain in her left lower leg as well as swelling. She denies any pain in her chest, shortness of breath.   BP 115/80 (BP Location: Right Arm)   Pulse 78   Temp 98.1 F (36.7 C) (Axillary)   Resp 18   Wt 75.6 kg (166 lb 10.7 oz)   SpO2 98%   BMI 33.66 kg/m  Constitutional: NAD, calm, comfortable Eyes: PERTLA, lids and conjunctivae normal ENMT: Mucous membranes are moist.  Neck: normal, supple, no masses, no thyromegaly Respiratory: clear to auscultation bilaterally, no wheezing, no crackles. Normal respiratory effort.   Cardiovascular: S1 & S2 heard, regular rate and rhythm, no significant murmur. Bilateral pretibial edema Lt >> Rt. LLE +3 edema  Abdomen: No distension, no tenderness, no masses palpated. Bowel sounds normal.  Musculoskeletal:  no clubbing / cyanosis. Normal muscle tone.  Skin: no significant rashes, lesions, ulcers. Warm, dry, well-perfused. Neurologic: No facial asymmetry, no focal deficits  grip strength 5/5, moves all extremities spontaneously, strength +4/5 diffusely  Psychiatric: Alert, knows name and that she is in hospital; not oriented to month or year. Normal mood and affect.   Generalized weakness, debility and decline - Likely multifactorial with deconditioning, dementia, possibly Parkinsonism contributing  - PT, OT   Left leg pain and swelling - Chronic problem per chart notes going back years  - Will check venous US, though was negative in April 2016  - Continue lasix   Chronic right basal ganglia lacunar infarct - Neurology has consulted and is much appreciated; the consultant was able to appreciate this lesion on the prior CT from March 2017  - Further eval with MRI precluded by pacer  - Continue Eliquis   Paroxysmal atrial fibrillation  - CHADS-VASc at least 26 (age x2, gender, CVA x2, HTN, CHF)  - Continue Eliquis and amiodarone  Chronic diastolic CHF  - BNP wnl and CXR clear on admission; BLE edema noted  - TTE (09/13/14) with EF 65-70%, mild concentric hypertrophy, grade 1 diastolic dysfunction, no wall-motion abnormality or significant valvular disease  - Continue Lasix - Echo pending   Essential hypertension - Continue Lasix    GERD - PPI   DVT prophylaxis: Eliquis Code Status: Full  Family Communication: spoke with son this morning with updates  Disposition: pending further work up and PT/OT    Noralee Stain, DO  [facility] Pager [phone]  If 7PM-7AM, please contact night-coverage [email or web address] Password [email or web address] 12/16/2015, 7:55 AM

## 2015-12-16 NOTE — ED Provider Notes (Signed)
[facility]-EMERGENCY DEPT Provider Note   CSN: [id number] Arrival date & time: 12/16/15  0020  By signing my name below, I, Sandrea Hammond, attest that this documentation has been prepared under the direction and in the presence of Joy Haegele, MD. Electronically Signed: Sandrea Hammond, ED Scribe. 12/16/15. 1:52 AM.     History   Chief Complaint Chief Complaint  Patient presents with  . Weakness   LEVEL 5 CAVEAT: HPI and ROS limited due to dementia.    HPI Comments: Tara Hughes is a 82 y.o. female who presents to the Emergency Department complaining of new onset bilateral LE weakness onset yesterday. According to a family member, the pt is able to transfer herself to a wheelchair at baseline, but has been unable to do so with her current symptoms. They note she has been unable to get off the toilet or ambulate independently using her walker. Family also reports BLE swelling, that has been present for more than 1 month. Pt had been seen by her PCP for this complaint 2 months ago and she was given a diuretic, which did not alleviate the swelling. Family states the pt experienced a recent fall 1 month ago, but they deny falls since. Family also denies rash, cough, congestion, fever. No recent changes in medications.   The history is provided by the patient and a relative. History limited by: dementia. No language interpreter was used.  Weakness  Primary symptoms include movement disorder. This is a new problem. The current episode started more than 1 week ago. The problem has not changed since onset.There was left lower extremity and right lower extremity focality noted. There has been no fever. Pertinent negatives include no chest pain and no vomiting. There were no medications administered prior to arrival. Associated medical issues include dementia.    Past Medical History:  Diagnosis Date  . Arthritis   . Chronic diastolic CHF (congestive heart failure) (HCC)    a. 08/2014: EF  65-70% with Grade 1 DD   . Dementia   . GERD (gastroesophageal reflux disease)   . High blood pressure   . High cholesterol   . Pacemaker-Biotronik 2006  . Paroxysmal atrial fibrillation (HCC) 09/13/2014  . PERIPHERAL EDEMA 09/13/2009   Qualifier: Diagnosis of  By: Jonny Ruiz MD, Len Blalock     Patient Active Problem List   Diagnosis Date Noted  . Dementia 08/02/2015  . Atrial fibrillation with RVR (HCC) 04/18/2015  . Anasarca 04/18/2015  . Debility 04/18/2015  . Visual hallucination 11/23/2014  . Hyperlipidemia 11/23/2014  . Essential hypertension, benign 11/23/2014  . Paroxysmal atrial fibrillation (HCC) 09/13/2014  . Pacemaker-Biotronik 09/13/2014  . Syncope 09/13/2014  . DEPRESSION 09/13/2009  . OTHER SPECIFIED FORMS OF HEARING LOSS 09/13/2009  . FATIGUE 09/13/2009  . KNEE PAIN, BILATERAL 07/25/2009  . GERD 03/08/2009  . SHOULDER PAIN, BILATERAL 03/08/2009  . OSTEOPENIA 03/08/2009  . Other and unspecified hyperlipidemia 01/19/2009  . MORBID OBESITY 01/17/2009  . Osteoarthritis 01/17/2009  . Chest pain, atypical 12/21/2008    Past Surgical History:  Procedure Laterality Date  . ABDOMINAL HYSTERECTOMY    . back operation    . CHOLECYSTECTOMY    . INSERT / REPLACE / REMOVE PACEMAKER    . TONSILLECTOMY      OB History    No data available       Home Medications    Prior to Admission medications   Medication Sig Start Date End Date Taking? Authorizing Provider  acetaminophen (TYLENOL) 500 MG tablet Take  1,000 mg by mouth every 6 (six) hours as needed for moderate pain.    Historical Provider, MD  amiodarone (PACERONE) 200 MG tablet Take 1 tablet (200 mg total) by mouth 2 (two) times daily. 06/02/15   Pricilla RifflePaula V Ross, MD  apixaban (ELIQUIS) 5 MG TABS tablet Take 1 tablet (5 mg total) by mouth 2 (two) times daily. 06/02/15   Pricilla RifflePaula V Ross, MD  cephALEXin (KEFLEX) 500 MG capsule Take 1 capsule (500 mg total) by mouth 2 (two) times daily. 07/24/15   Laurence Spatesachel Morgan Little, MD    docusate sodium (COLACE) 100 MG capsule Take 1 capsule (100 mg total) by mouth daily. 05/30/15   Lavera Guiseana Duo Liu, MD  furosemide (LASIX) 20 MG tablet Take 1-2 tablets (20-40 mg total) by mouth daily. Take 20 mg twice daily on Mon / Wed / Fri 11/14/15   Pricilla RifflePaula V Ross, MD  metoprolol succinate (TOPROL-XL) 25 MG 24 hr tablet Take 1 tablet (25 mg total) by mouth daily. 06/02/15   Pricilla RifflePaula V Ross, MD  omeprazole (PRILOSEC) 40 MG capsule Take 1 capsule (40 mg total) by mouth daily. 06/02/15   Pricilla RifflePaula V Ross, MD  polyethylene glycol powder (GLYCOLAX/MIRALAX) powder Take 17 g by mouth daily. Dissolve on capful of powder into any fluid and drink once daily. May increase to two times daily if no effect in 3 days. 11/07/15   Kirt BoysMonica Carter, DO  potassium chloride (K-DUR) 10 MEQ tablet Take 2 tablets (20 mEq total) by mouth 3 (three) times a week. 06/02/15   Pricilla RifflePaula V Ross, MD  potassium chloride (K-DUR) 10 MEQ tablet TAKE 2 TABLETS BY MOUTH THREE TIMES WEEKLY 10/24/15   Pricilla RifflePaula V Ross, MD  traMADol (ULTRAM) 50 MG tablet Take 1 tablet (50 mg total) by mouth 3 (three) times daily with meals as needed for moderate pain or severe pain. 06/30/15   Kirt BoysMonica Carter, DO  zoster vaccine live, PF, (ZOSTAVAX) [id number] UNT/0.65ML injection Inject 19,400 Units into the skin once. 06/30/15   Kirt BoysMonica Carter, DO    Family History Family History  Problem Relation Age of Onset  . Cancer Father   . Diabetes Brother   . Diverticulitis Sister   . Heart disease Sister     Social History Social History  Substance Use Topics  . Smoking status: Never Smoker  . Smokeless tobacco: Never Used  . Alcohol use No     Allergies   Review of patient's allergies indicates no known allergies.   Review of Systems Review of Systems  Reason unable to perform ROS: dementia.  Constitutional: Negative for fever.  HENT: Negative for congestion.   Eyes: Negative for photophobia.  Respiratory: Negative for cough.   Cardiovascular: Negative for chest pain.   Gastrointestinal: Negative for abdominal pain and vomiting.  Neurological: Positive for weakness. Negative for syncope.     Physical Exam Updated Vital Signs BP 124/96   Pulse 74   Temp 98.1 F (36.7 C) (Oral)   Resp 16   SpO2 100%   Physical Exam  Constitutional: She appears well-developed and well-nourished.  HENT:  Head: Normocephalic and atraumatic.  Mouth/Throat: Oropharynx is clear and moist. No oropharyngeal exudate.  Moist mucous membranes No exudate.   Eyes: Conjunctivae and EOM are normal. Pupils are equal, round, and reactive to light. Right eye exhibits discharge.  Cataracts present. Crusting present.   Neck: Normal range of motion. Neck supple. No JVD present. No tracheal deviation present.  No carotid bruits. Trachea midline.   Cardiovascular: Normal  rate and regular rhythm.  Exam reveals no gallop and no friction rub.   No murmur heard. RRR.  Hearts sounds diminished bilaterally Posterior tibial pulses intact  Pulmonary/Chest: Effort normal. No stridor. No respiratory distress. She has no wheezes. She has no rales.  Diminished bilaterally  Abdominal: Soft. Bowel sounds are normal. She exhibits no distension. There is no rebound and no guarding.  Constipation present  Musculoskeletal: Normal range of motion. She exhibits edema.  Pitting edema to bilateral mid shins. All compartments soft. No cords.   Lymphadenopathy:    She has no cervical adenopathy.  Neurological: She is alert. She has normal reflexes.  Pill-rolling tremor RUE  Skin: Skin is warm and dry.  Psychiatric: She has a normal mood and affect.  Nursing note and vitals reviewed.    ED Treatments / Results   Clinical Course   DIAGNOSTIC STUDIES: Oxygen Saturation is 100% on RA, normal by my interpretation.    COORDINATION OF CARE: 1:47 AM Discussed treatment plan with pt and family at bedside which includes lab work, CT and pt and family agreed to plan.  3:58 AM Spoke with hospitalist,  Dr. Joneen Roach, who will admit pt to tele obvs.   Labs (all labs ordered are listed, but only abnormal results are displayed) Labs Reviewed  CBC WITH DIFFERENTIAL/PLATELET  URINALYSIS, ROUTINE W REFLEX MICROSCOPIC (NOT AT [facility])  BRAIN NATRIURETIC PEPTIDE  I-STAT CHEM 8, ED    EKG  EKG Interpretation None       Radiology No results found.  Procedures Procedures (including critical care time)  Medications Ordered in ED Medications - No data to display   Initial Impression / Assessment and Plan / ED Course  I have reviewed the triage vital signs and the nursing notes.  Pertinent labs & imaging results that were available during my care of the patient were reviewed by me and considered in my medical decision making (see chart for details).  Vitals:   12/16/15 0130 12/16/15 0200  BP: 150/65 154/72  Pulse: 79 73  Resp: 15 18  Temp:     Results for orders placed or performed during the hospital encounter of 12/16/15  CBC with Differential/Platelet  Result Value Ref Range   WBC 8.7 4.0 - 10.5 K/uL   RBC 4.08 3.87 - 5.11 MIL/uL   Hemoglobin 12.4 12.0 - 15.0 g/dL   HCT 11.9 14.7 - 82.9 %   MCV 92.6 78.0 - 100.0 fL   MCH 30.4 26.0 - 34.0 pg   MCHC 32.8 30.0 - 36.0 g/dL   RDW 56.2 13.0 - 86.5 %   Platelets 259 150 - 400 K/uL   Neutrophils Relative % 63 %   Neutro Abs 5.5 1.7 - 7.7 K/uL   Lymphocytes Relative 23 %   Lymphs Abs 2.0 0.7 - 4.0 K/uL   Monocytes Relative 13 %   Monocytes Absolute 1.1 (H) 0.1 - 1.0 K/uL   Eosinophils Relative 1 %   Eosinophils Absolute 0.1 0.0 - 0.7 K/uL   Basophils Relative 0 %   Basophils Absolute 0.0 0.0 - 0.1 K/uL  Urinalysis, Routine w reflex microscopic (not at [facility])  Result Value Ref Range   Color, Urine YELLOW YELLOW   APPearance CLOUDY (A) CLEAR   Specific Gravity, Urine 1.019 1.005 - 1.030   pH 5.5 5.0 - 8.0   Glucose, UA NEGATIVE NEGATIVE mg/dL   Hgb urine dipstick NEGATIVE NEGATIVE   Bilirubin Urine SMALL (A) NEGATIVE    Ketones, ur NEGATIVE NEGATIVE mg/dL  Protein, ur NEGATIVE NEGATIVE mg/dL   Nitrite NEGATIVE NEGATIVE   Leukocytes, UA NEGATIVE NEGATIVE  Brain natriuretic peptide  Result Value Ref Range   B Natriuretic Peptide 25.6 0.0 - 100.0 pg/mL  I-Stat Chem 8, ED  Result Value Ref Range   Sodium 141 135 - 145 mmol/L   Potassium 3.8 3.5 - 5.1 mmol/L   Chloride 103 101 - 111 mmol/L   BUN 12 6 - 20 mg/dL   Creatinine, Ser 1.61 0.44 - 1.00 mg/dL   Glucose, Bld 91 65 - 99 mg/dL   Calcium, Ion 0.96 (L) 1.15 - 1.40 mmol/L   TCO2 27 0 - 100 mmol/L   Hemoglobin 13.6 12.0 - 15.0 g/dL   HCT 04.5 40.9 - 81.1 %  I-stat troponin, ED  Result Value Ref Range   Troponin i, poc 0.00 0.00 - 0.08 ng/mL   Comment 3           Dg Chest 2 View  Result Date: 12/16/2015 CLINICAL DATA:  Generalized weakness, of indeterminate chronicity. Initial encounter. EXAM: CHEST  2 VIEW COMPARISON:  Chest radiograph performed 07/23/2015 FINDINGS: The lungs are relatively well-aerated and clear. There is no evidence of focal opacification, pleural effusion or pneumothorax. Nodular opacity at the left midlung zone appears to reflect normal vasculature, on correlation with the prior study. The heart is normal in size; the mediastinal contour is within normal limits. A pacemaker is noted at the left chest wall, with leads ending at the right atrium and right ventricle. No acute osseous abnormalities are seen. Clips are noted within the right upper quadrant, reflecting prior cholecystectomy. IMPRESSION: No acute cardiopulmonary process seen. Electronically Signed   By: Roanna Raider M.D.   On: 12/16/2015 02:52   Ct Head Wo Contrast  Result Date: 12/16/2015 CLINICAL DATA:  Weakness and fatigue. EXAM: CT HEAD WITHOUT CONTRAST TECHNIQUE: Contiguous axial images were obtained from the base of the skull through the vertex without intravenous contrast. COMPARISON:  Head CT 06/23/2015 FINDINGS: Brain: No evidence of acute infarction, hemorrhage,  hydrocephalus, extra-axial collection or mass lesion/mass effect. Small lacunar infarct in the right basal ganglia, age-indeterminate. Mild normal for age chronic small vessel ischemia. Under artifact of the frontal lobes. Vascular: No hyperdense vessel or unexpected calcification. Atherosclerosis of skullbase vasculature. Skull: Normal. Negative for fracture or focal lesion. Sinuses/Orbits: No acute finding. IMPRESSION: Age indeterminate lacunar infarct in the right basal ganglia. There is otherwise no acute intracranial abnormality. Electronically Signed   By: Rubye Oaks M.D.   On: 12/16/2015 03:44   Medications - No data to display   Final Clinical Impressions(s) / ED Diagnoses   Final diagnoses:  None    New Prescriptions New Prescriptions   No medications on file   I personally performed the services described in this documentation, which was scribed in my presence. The recorded information has been reviewed and is accurate.        Cy Blamer, MD 12/16/15 [phone]

## 2015-12-17 ENCOUNTER — Observation Stay (HOSPITAL_BASED_OUTPATIENT_CLINIC_OR_DEPARTMENT_OTHER): Payer: Medicare Other

## 2015-12-17 DIAGNOSIS — I5032 Chronic diastolic (congestive) heart failure: Secondary | ICD-10-CM | POA: Diagnosis not present

## 2015-12-17 DIAGNOSIS — F0390 Unspecified dementia without behavioral disturbance: Secondary | ICD-10-CM | POA: Diagnosis not present

## 2015-12-17 DIAGNOSIS — R531 Weakness: Secondary | ICD-10-CM | POA: Diagnosis not present

## 2015-12-17 DIAGNOSIS — I6789 Other cerebrovascular disease: Secondary | ICD-10-CM | POA: Diagnosis not present

## 2015-12-17 DIAGNOSIS — R29898 Other symptoms and signs involving the musculoskeletal system: Secondary | ICD-10-CM

## 2015-12-17 DIAGNOSIS — M79609 Pain in unspecified limb: Secondary | ICD-10-CM

## 2015-12-17 DIAGNOSIS — I10 Essential (primary) hypertension: Secondary | ICD-10-CM | POA: Diagnosis not present

## 2015-12-17 DIAGNOSIS — M7989 Other specified soft tissue disorders: Secondary | ICD-10-CM

## 2015-12-17 LAB — BASIC METABOLIC PANEL
Anion gap: 7 (ref 5–15)
BUN: 10 mg/dL (ref 6–20)
CHLORIDE: 104 mmol/L (ref 101–111)
CO2: 29 mmol/L (ref 22–32)
CREATININE: 0.89 mg/dL (ref 0.44–1.00)
Calcium: 8.9 mg/dL (ref 8.9–10.3)
GFR calc Af Amer: >60 mL/min (ref >60)
GFR calc non Af Amer: 56 mL/min — ABNORMAL LOW (ref >60)
Glucose, Bld: 84 mg/dL (ref 65–99)
Potassium: 3.7 mmol/L (ref 3.5–5.1)
SODIUM: 140 mmol/L (ref 135–145)

## 2015-12-17 LAB — CBC WITH DIFFERENTIAL/PLATELET
Basophils Absolute: 0 10*3/uL (ref 0.0–0.1)
Basophils Relative: 1 %
EOS ABS: 0.2 10*3/uL (ref 0.0–0.7)
Eosinophils Relative: 3 %
HCT: 32.3 % — ABNORMAL LOW (ref 36.0–46.0)
Hemoglobin: 10.4 g/dL — ABNORMAL LOW (ref 12.0–15.0)
LYMPHS ABS: 1.6 10*3/uL (ref 0.7–4.0)
Lymphocytes Relative: 27 %
MCH: 29.7 pg (ref 26.0–34.0)
MCHC: 32.2 g/dL (ref 30.0–36.0)
MCV: 92.3 fL (ref 78.0–100.0)
MONO ABS: 0.8 10*3/uL (ref 0.1–1.0)
MONOS PCT: 15 %
NEUTROS PCT: 55 %
Neutro Abs: 3.2 10*3/uL (ref 1.7–7.7)
Platelets: 202 10*3/uL (ref 150–400)
RBC: 3.5 MIL/uL — ABNORMAL LOW (ref 3.87–5.11)
RDW: 15.6 % — ABNORMAL HIGH (ref 11.5–15.5)
WBC: 5.8 10*3/uL (ref 4.0–10.5)

## 2015-12-17 LAB — ECHOCARDIOGRAM COMPLETE: WEIGHTICAEL: 2666.68 [oz_av]

## 2015-12-17 LAB — HIV ANTIBODY (ROUTINE TESTING W REFLEX): HIV Screen 4th Generation wRfx: NONREACTIVE

## 2015-12-17 LAB — HEMOGLOBIN A1C
Hgb A1c MFr Bld: 5.5 % (ref 4.8–5.6)
Mean Plasma Glucose: 111 mg/dL

## 2015-12-17 LAB — RPR: RPR Ser Ql: NONREACTIVE

## 2015-12-17 MED ORDER — ATORVASTATIN CALCIUM 40 MG PO TABS
40.0000 mg | ORAL_TABLET | Freq: Every day | ORAL | Status: DC
  Administered 2015-12-17: 40 mg via ORAL
  Filled 2015-12-17: qty 1

## 2015-12-17 NOTE — Progress Notes (Signed)
VASCULAR LAB PRELIMINARY  PRELIMINARY  PRELIMINARY  PRELIMINARY  Bilateral lower extremity venous duplex completed.    Preliminary report:  There is no DVT or SVT noted in the bilateral lower extremities.   Eshika Reckart, RVT 12/17/2015, 5:12 PM

## 2015-12-17 NOTE — Progress Notes (Signed)
PROGRESS NOTE    Tara Hughes  ZOX:[id number] DOB: [birth year] DOA: 12/16/2015 PCP: [name], DO     Brief Narrative:  Tara Schoenfelder Blackwellis a 82 y.o.femalewith medical history significant forparoxysmal atrial fibrillation on Eliquis, chronic diastolic CHF, dementia, hypertension, and GERD who presents to the emergency department for evaluation of generalized weakness with pain and swelling of the left leg. Patient lives with her son and had been in her usual state of health until approximately 2 weeks ago when her son noted the insidious development of decreased activity level and apparent generalized weakness.   Per patient's son, he states that she has had chronic bilateral lower extremity edema as well as left lower leg pain for months, dating back to January 2017. She was hospitalized in January 2017 and was diagnosed with heart failure and was discharged with Lasix. He states that her PCP has been managing her diuretic dosage. Over the last 2 weeks, patient has been less ambulatory. At baseline, patient is able to ambulate with a walker and one person assist. He denies any acute changes to her left leg swelling or pain from baseline.    Assessment & Plan:   Principal Problem:   Generalized weakness Active Problems:   GERD   Paroxysmal atrial fibrillation (HCC)   Essential hypertension, benign   Dementia   Right-sided lacunar infarction (HCC)   Pain and swelling of left lower leg   Chronic diastolic CHF (congestive heart failure) (HCC)   Generalized weakness, debility and decline - Likely multifactorial with deconditioning, dementia, possibly Parkinsonism contributing  - PT, OT: SNF   Left leg pain and swelling - Chronic problem per chart notes going back years  - Will check venous US, though was negative in April 2016  - Continue lasix   Chronic right basal ganglia lacunar infarct - Neurology has consulted and is much appreciated; the consultant was able to  appreciate this lesion on the prior CT from March 2017  - Further eval with MRI precluded by pacer  - Continue Eliquis   HLD - Start lipitor  Paroxysmal atrial fibrillation  - CHADS-VASc at least 16 (age x2, gender, CVA x2, HTN, CHF)  - Continue Eliquis and amiodarone  Chronic diastolic CHF  - BNP wnl and CXR clear on admission; BLE edema noted  - TTE (09/13/14) with EF 65-70%, mild concentric hypertrophy, grade 1 diastolic dysfunction, no wall-motion abnormality or significant valvular disease  - Continue Lasix - Echo pending   Essential hypertension - Continue Lasix   GERD - PPI   DVT prophylaxis:Eliquis Code Status:Full  Family Communication:son at bedside this morning. Attempted to call sister x2 with no answer  Disposition: pending further work up. Awaiting Echo and LLE doppler. Recommend SNF when able.   Consultants:   Neurology, signed off  Procedures:   Echo pending  Venous dopplers pending  Antimicrobials:   None     Subjective: Patient seen this morning. She is not attempting to interact much during my exam. Her son is at bedside, able to give some history. She was able to eat breakfast this morning and worked with physical therapy yesterday. She admits to left leg pain which is chronic. Otherwise, she denies any other complaints.  Objective: Vitals:   12/16/15 2100 12/17/15 0100 12/17/15 0500 12/17/15 0917  BP: (!) 118/51 (!) 113/48 (!) 128/96 108/85  Pulse: 74 69 88 77  Resp: 16 14 16 18   Temp: 98.4 F (36.9 C) 98.5 F (36.9 C) 98.7 F (37.1 C)  TempSrc: Oral Oral Oral   SpO2: 100% 99% 99% 100%  Weight:       No intake or output data in the 24 hours ending 12/17/15 1141 Filed Weights   12/16/15 0600  Weight: 75.6 kg (166 lb 10.7 oz)    Examination:  Constitutional:NAD, calm, comfortable Neck:normal, supple, no masses, no thyromegaly Respiratory:clear to auscultation bilaterally, no wheezing, no crackles. Normal respiratory  effort.  Cardiovascular:S1 &S2 heard, regular rate and rhythm, no significant murmur. Bilateral pretibial edema Lt >> Rt. LLE +3 edema  Abdomen:No distension, no tenderness, no masses palpated. Bowel sounds normal.  Musculoskeletal:no clubbing / cyanosis. Normal muscle tone.  Skin:no significant rashes, lesions, ulcers. Warm, dry, well-perfused. Neurologic:No facial asymmetry, no focal deficits  grip strength 5/5, moves all extremities spontaneously, strength +4/5 diffusely  Psychiatric:Alert, knows name and that she is in hospital; not oriented to month or year.   Data Reviewed: I have personally reviewed following labs and imaging studies  CBC:  Recent Labs Lab 12/16/15 0022 12/16/15 0046 12/17/15 0516  WBC 8.7  --  5.8  NEUTROABS 5.5  --  3.2  HGB 12.4 13.6 10.4*  HCT 37.8 40.0 32.3*  MCV 92.6  --  92.3  PLT 259  --  202   Basic Metabolic Panel:  Recent Labs Lab 12/16/15 0046 12/16/15 0445 12/17/15 0516  NA 141 138 140  K 3.8 3.7 3.7  CL 103 105 104  CO2  --  25 29  GLUCOSE 91 91 84  BUN 12 11 10   CREATININE 0.90 0.86 0.89  CALCIUM  --  8.9 8.9   GFR: Estimated Creatinine Clearance: 38.8 mL/min (by C-G formula based on SCr of 0.89 mg/dL). Liver Function Tests:  Recent Labs Lab 12/16/15 0445  AST 27  ALT 34  ALKPHOS 64  BILITOT 0.4  PROT 6.6  ALBUMIN 2.8*   No results for input(s): LIPASE, AMYLASE in the last 168 hours. No results for input(s): AMMONIA in the last 168 hours. Coagulation Profile: No results for input(s): INR, PROTIME in the last 168 hours. Cardiac Enzymes: No results for input(s): CKTOTAL, CKMB, CKMBINDEX, TROPONINI in the last 168 hours. BNP (last 3 results) No results for input(s): PROBNP in the last 8760 hours. HbA1C: No results for input(s): HGBA1C in the last 72 hours. CBG: No results for input(s): GLUCAP in the last 168 hours. Lipid Profile:  Recent Labs  12/16/15 1038  CHOL 192  HDL 45  LDLCALC 132*  TRIG 76    CHOLHDL 4.3   Thyroid Function Tests:  Recent Labs  12/16/15 0446  TSH 2.745   Anemia Panel:  Recent Labs  12/16/15 0445  VITAMINB12 376   Sepsis Labs: No results for input(s): PROCALCITON, LATICACIDVEN in the last 168 hours.  No results found for this or any previous visit (from the past 240 hour(s)).       Radiology Studies: Dg Chest 2 View  Result Date: 12/16/2015 CLINICAL DATA:  Generalized weakness, of indeterminate chronicity. Initial encounter. EXAM: CHEST  2 VIEW COMPARISON:  Chest radiograph performed 07/23/2015 FINDINGS: The lungs are relatively well-aerated and clear. There is no evidence of focal opacification, pleural effusion or pneumothorax. Nodular opacity at the left midlung zone appears to reflect normal vasculature, on correlation with the prior study. The heart is normal in size; the mediastinal contour is within normal limits. A pacemaker is noted at the left chest wall, with leads ending at the right atrium and right ventricle. No acute osseous abnormalities are seen. Clips  are noted within the right upper quadrant, reflecting prior cholecystectomy. IMPRESSION: No acute cardiopulmonary process seen. Electronically Signed   By: Roanna RaiderJeffery  Chang M.D.   On: 12/16/2015 02:52   Ct Head Wo Contrast  Result Date: 12/16/2015 CLINICAL DATA:  Weakness and fatigue. EXAM: CT HEAD WITHOUT CONTRAST TECHNIQUE: Contiguous axial images were obtained from the base of the skull through the vertex without intravenous contrast. COMPARISON:  Head CT 06/23/2015 FINDINGS: Brain: No evidence of acute infarction, hemorrhage, hydrocephalus, extra-axial collection or mass lesion/mass effect. Small lacunar infarct in the right basal ganglia, age-indeterminate. Mild normal for age chronic small vessel ischemia. Under artifact of the frontal lobes. Vascular: No hyperdense vessel or unexpected calcification. Atherosclerosis of skullbase vasculature. Skull: Normal. Negative for fracture or focal  lesion. Sinuses/Orbits: No acute finding. IMPRESSION: Age indeterminate lacunar infarct in the right basal ganglia. There is otherwise no acute intracranial abnormality. Electronically Signed   By: Rubye OaksMelanie  Ehinger M.D.   On: 12/16/2015 03:44        Scheduled Meds: . amiodarone  200 mg Oral BID  . apixaban  5 mg Oral BID  . aspirin  300 mg Rectal Daily   Or  . aspirin  325 mg Oral Daily  . atorvastatin  40 mg Oral q1800  . furosemide  40 mg Oral Daily  . pantoprazole  40 mg Oral Daily  . potassium chloride  10 mEq Oral Daily   Continuous Infusions: . sodium chloride       LOS: 0 days    Time spent: 30 minutes    Noralee StainJennifer Nashayla Telleria, DO [facility] Pager [phone]  If 7PM-7AM, please contact night-coverage [email or web address] Password [email or web address] 12/17/2015, 11:41 AM

## 2015-12-17 NOTE — Progress Notes (Signed)
  Echocardiogram 2D Echocardiogram has been performed.  Arvil ChacoFoster, Tra Wilemon 12/17/2015, 2:06 PM

## 2015-12-17 NOTE — Progress Notes (Signed)
VASCULAR LAB PRELIMINARY  PRELIMINARY  PRELIMINARY  PRELIMINARY  Carotid duplex completed.    Preliminary report:  No significant ICA stenosis noted on the right.  Unable to visualize the right vertebral artery or the left carotid and vertebral arteries secondary to patient's head positioning.  Tara Hughes, RVT 12/17/2015, 5:13 PM

## 2015-12-18 DIAGNOSIS — I480 Paroxysmal atrial fibrillation: Secondary | ICD-10-CM | POA: Diagnosis not present

## 2015-12-18 DIAGNOSIS — I5032 Chronic diastolic (congestive) heart failure: Secondary | ICD-10-CM | POA: Diagnosis not present

## 2015-12-18 DIAGNOSIS — R531 Weakness: Secondary | ICD-10-CM | POA: Diagnosis not present

## 2015-12-18 DIAGNOSIS — F0390 Unspecified dementia without behavioral disturbance: Secondary | ICD-10-CM | POA: Diagnosis not present

## 2015-12-18 LAB — BASIC METABOLIC PANEL
ANION GAP: 7 (ref 5–15)
BUN: 15 mg/dL (ref 6–20)
CHLORIDE: 103 mmol/L (ref 101–111)
CO2: 28 mmol/L (ref 22–32)
Calcium: 8.9 mg/dL (ref 8.9–10.3)
Creatinine, Ser: 0.94 mg/dL (ref 0.44–1.00)
GFR calc Af Amer: >60 mL/min (ref >60)
GFR, EST NON AFRICAN AMERICAN: 53 mL/min — AB (ref >60)
Glucose, Bld: 100 mg/dL — ABNORMAL HIGH (ref 65–99)
POTASSIUM: 4 mmol/L (ref 3.5–5.1)
SODIUM: 138 mmol/L (ref 135–145)

## 2015-12-18 LAB — CBC WITH DIFFERENTIAL/PLATELET
BASOS ABS: 0.1 10*3/uL (ref 0.0–0.1)
Basophils Relative: 1 %
EOS PCT: 3 %
Eosinophils Absolute: 0.2 10*3/uL (ref 0.0–0.7)
HCT: 36.1 % (ref 36.0–46.0)
HEMOGLOBIN: 11.8 g/dL — AB (ref 12.0–15.0)
LYMPHS ABS: 1.8 10*3/uL (ref 0.7–4.0)
LYMPHS PCT: 22 %
MCH: 30.4 pg (ref 26.0–34.0)
MCHC: 32.7 g/dL (ref 30.0–36.0)
MCV: 93 fL (ref 78.0–100.0)
Monocytes Absolute: 1.2 10*3/uL — ABNORMAL HIGH (ref 0.1–1.0)
Monocytes Relative: 14 %
Neutro Abs: 4.9 10*3/uL (ref 1.7–7.7)
Neutrophils Relative %: 60 %
PLATELETS: 231 10*3/uL (ref 150–400)
RBC: 3.88 MIL/uL (ref 3.87–5.11)
RDW: 15.6 % — ABNORMAL HIGH (ref 11.5–15.5)
WBC: 8.1 10*3/uL (ref 4.0–10.5)

## 2015-12-18 LAB — VAS US CAROTID
RCCAPDIAS: 14 cm/s
RCCAPSYS: 69 cm/s
RIGHT ECA DIAS: -1 cm/s
Right cca dist sys: -43 cm/s

## 2015-12-18 MED ORDER — FUROSEMIDE 40 MG PO TABS: 40.0000 mg | ORAL_TABLET | Freq: Every day | ORAL | 2 refills | Status: DC

## 2015-12-18 MED ORDER — POTASSIUM CHLORIDE CRYS ER 10 MEQ PO TBCR: 10.0000 meq | EXTENDED_RELEASE_TABLET | Freq: Every day | ORAL | 2 refills | Status: DC

## 2015-12-18 MED ORDER — ATORVASTATIN CALCIUM 40 MG PO TABS: 40.0000 mg | ORAL_TABLET | Freq: Every day | ORAL | 2 refills | Status: DC

## 2015-12-18 NOTE — Progress Notes (Signed)
Patient family anxious, appeared irritable at not being able to leave floor via PTAR. PTAR have not arrived yet, although family was notified by staff of PTAR services are backed up at this time.  Family are insisting on taking patient home themselves as they stated they do not want to wait anymore for PTAR.  Family will transfer patient to wheelchair to private car, after instructions given by RN about the risks of self transfer of patient.

## 2015-12-18 NOTE — Discharge Summary (Signed)
Physician Discharge Summary  Tara Hughes ZOX:[id number] DOB: 1925-12-09 DOA: 12/16/2015  PCP: [name], DO  Admit date: 12/16/2015 Discharge date: 12/18/2015  Time spent: 35 minutes  Recommendations for Outpatient Follow-up:  1. Patient discharged home 2. Home health physical therapy, social work  3. Dose of Lasix changed to 40 mg by mouth daily 4. Continue potassium 10 meq by mouth daily 5. Follow PCP in 2 weeks   Discharge Diagnoses:  Principal Problem:   Generalized weakness Active Problems:   GERD   Paroxysmal atrial fibrillation (HCC)   Essential hypertension, benign   Dementia   Right-sided lacunar infarction (HCC)   Pain and swelling of left lower leg   Chronic diastolic CHF (congestive heart failure) (HCC)   Discharge Condition: Stable  Diet recommendation: Regular diet  Filed Weights   12/16/15 0600  Weight: 75.6 kg (166 lb 10.7 oz)    History of present illness:  82 y.o.femalewith medical history significant forparoxysmal atrial fibrillation on Eliquis, chronic diastolic CHF, dementia, hypertension, and GERD who presents to the emergency department for evaluation of generalized weakness with pain and swelling of the left leg. Patient lives with her son and had been in her usual state of health until approximately 2 weeks ago when her son noted the insidious development of decreased activity level and apparent generalized weakness.   Hospital Course:   Generalized weakness, debility and decline - Likely multifactorial with deconditioning, dementia, possibly Parkinsonism contributing  - Physical therapy recommended skilled nursing facility - Patient unable to go to skilled nursing facility at this time as she is under observation status, family decided to take him home with home health physical therapy. Later they would like the patient to be admitted to skilled rehabilitation, on private pay.  Left leg pain and swelling Improved with  Lasix Venous Dopplers negative for DVT - Chronic problem per chart notes going back years  - Likely from CHF, will change Lasix to 40 mg by mouth daily  Chronic right basal ganglia lacunar infarct - Neurology has consulted and is much appreciated; the consultant was able to appreciate this lesion on the prior CT from March 2017  - Further eval with MRI precluded by pacer  - Continue Eliquis   Hyperlipidemia - Started  lipitor  Paroxysmal atrial fibrillation  - CHADS-VASc at least 71 (age x2, gender, CVA x2, HTN, CHF)  - Continue Eliquis and amiodarone  Chronic diastolic CHF  - BNP wnl and CXR clear on admission; BLE edema noted  - TTE (09/13/14) with EF 65-70%, mild concentric hypertrophy, grade 1 diastolic dysfunction, no wall-motion abnormality or significant valvular disease  - Continue Lasix 40 mg by mouth daily - Echo showed grade 1 diastolic dysfunction, ejection fraction 65-70%   Procedures:  Bilateral carotid duplex  Echocardiogram  Consultations:  None  Discharge Exam: Vitals:   12/18/15 1038 12/18/15 1317  BP: 120/76 125/65  Pulse: 78 75  Resp: 18   Temp: 98 F (36.7 C) 98.6 F (37 C)    General: Appears in no acute distress Cardiovascular: RRR, no murmurs auscultated Respiratory: Clear to auscultation bilaterally, normal respiratory effort  Discharge Instructions   Discharge Instructions    Diet - low sodium heart healthy    Complete by:  As directed    Increase activity slowly    Complete by:  As directed      Current Discharge Medication List    START taking these medications   Details  atorvastatin (LIPITOR) 40 MG tablet Take 1  tablet (40 mg total) by mouth daily at 6 PM. Qty: 30 tablet, Refills: 2    potassium chloride (K-DUR,KLOR-CON) 10 MEQ tablet Take 1 tablet (10 mEq total) by mouth daily. Qty: 30 tablet, Refills: 2      CONTINUE these medications which have CHANGED   Details  furosemide (LASIX) 40 MG tablet Take 1 tablet  (40 mg total) by mouth daily. Qty: 30 tablet, Refills: 2      CONTINUE these medications which have NOT CHANGED   Details  acetaminophen (TYLENOL) 500 MG tablet Take 1,000 mg by mouth every 6 (six) hours as needed for moderate pain.    amiodarone (PACERONE) 200 MG tablet Take 1 tablet (200 mg total) by mouth 2 (two) times daily. Qty: 60 tablet, Refills: 11   Associated Diagnoses: Gastroesophageal reflux disease without esophagitis; Essential hypertension, benign; Paroxysmal atrial fibrillation (HCC); Localized edema    apixaban (ELIQUIS) 5 MG TABS tablet Take 1 tablet (5 mg total) by mouth 2 (two) times daily. Qty: 60 tablet, Refills: 11   Associated Diagnoses: Gastroesophageal reflux disease without esophagitis; Essential hypertension, benign; Paroxysmal atrial fibrillation (HCC); Localized edema    omeprazole (PRILOSEC) 40 MG capsule Take 1 capsule (40 mg total) by mouth daily. Qty: 30 capsule, Refills: 11   Associated Diagnoses: Gastroesophageal reflux disease without esophagitis; Essential hypertension, benign; Paroxysmal atrial fibrillation (HCC); Localized edema    polyethylene glycol powder (GLYCOLAX/MIRALAX) powder Take 17 g by mouth daily. Dissolve on capful of powder into any fluid and drink once daily. May increase to two times daily if no effect in 3 days. Qty: 255 g, Refills: 3      STOP taking these medications     potassium chloride (K-DUR) 10 MEQ tablet        No Known Allergies    The results of significant diagnostics from this hospitalization (including imaging, microbiology, ancillary and laboratory) are listed below for reference.    Significant Diagnostic Studies: Dg Chest 2 View  Result Date: 12/16/2015 CLINICAL DATA:  Generalized weakness, of indeterminate chronicity. Initial encounter. EXAM: CHEST  2 VIEW COMPARISON:  Chest radiograph performed 07/23/2015 FINDINGS: The lungs are relatively well-aerated and clear. There is no evidence of focal  opacification, pleural effusion or pneumothorax. Nodular opacity at the left midlung zone appears to reflect normal vasculature, on correlation with the prior study. The heart is normal in size; the mediastinal contour is within normal limits. A pacemaker is noted at the left chest wall, with leads ending at the right atrium and right ventricle. No acute osseous abnormalities are seen. Clips are noted within the right upper quadrant, reflecting prior cholecystectomy. IMPRESSION: No acute cardiopulmonary process seen. Electronically Signed   By: Roanna RaiderJeffery  Chang M.D.   On: 12/16/2015 02:52   Ct Head Wo Contrast  Result Date: 12/16/2015 CLINICAL DATA:  Weakness and fatigue. EXAM: CT HEAD WITHOUT CONTRAST TECHNIQUE: Contiguous axial images were obtained from the base of the skull through the vertex without intravenous contrast. COMPARISON:  Head CT 06/23/2015 FINDINGS: Brain: No evidence of acute infarction, hemorrhage, hydrocephalus, extra-axial collection or mass lesion/mass effect. Small lacunar infarct in the right basal ganglia, age-indeterminate. Mild normal for age chronic small vessel ischemia. Under artifact of the frontal lobes. Vascular: No hyperdense vessel or unexpected calcification. Atherosclerosis of skullbase vasculature. Skull: Normal. Negative for fracture or focal lesion. Sinuses/Orbits: No acute finding. IMPRESSION: Age indeterminate lacunar infarct in the right basal ganglia. There is otherwise no acute intracranial abnormality. Electronically Signed   By: Shawna OrleansMelanie  Ehinger M.D.   On: 12/16/2015 03:44    Microbiology: No results found for this or any previous visit (from the past 240 hour(s)).   Labs: Basic Metabolic Panel:  Recent Labs Lab 12/16/15 0046 12/16/15 0445 12/17/15 0516 12/18/15 0222  NA 141 138 140 138  K 3.8 3.7 3.7 4.0  CL 103 105 104 103  CO2  --  25 29 28   GLUCOSE 91 91 84 100*  BUN 12 11 10 15   CREATININE 0.90 0.86 0.89 0.94  CALCIUM  --  8.9 8.9 8.9    Liver Function Tests:  Recent Labs Lab 12/16/15 0445  AST 27  ALT 34  ALKPHOS 64  BILITOT 0.4  PROT 6.6  ALBUMIN 2.8*   No results for input(s): LIPASE, AMYLASE in the last 168 hours. No results for input(s): AMMONIA in the last 168 hours. CBC:  Recent Labs Lab 12/16/15 0022 12/16/15 0046 12/17/15 0516 12/18/15 0222  WBC 8.7  --  5.8 8.1  NEUTROABS 5.5  --  3.2 4.9  HGB 12.4 13.6 10.4* 11.8*  HCT 37.8 40.0 32.3* 36.1  MCV 92.6  --  92.3 93.0  PLT 259  --  202 231   Cardiac Enzymes: No results for input(s): CKTOTAL, CKMB, CKMBINDEX, TROPONINI in the last 168 hours. BNP: BNP (last 3 results)  Recent Labs  06/02/15 0953 07/23/15 2339 12/16/15 0023  BNP 9.1 61.2 25.6    ProBNP (last 3 results) No results for input(s): PROBNP in the last 8760 hours.  CBG: No results for input(s): GLUCAP in the last 168 hours.     SignedMeredeth Ide MD.  [facility] 12/18/2015, 1:55 PM

## 2015-12-18 NOTE — Evaluation (Signed)
SLP Cancellation Note  Patient Details Name: Charm Ringsnnie M Iannacone MRN: [id number] DOB: 1925-04-13   Cancelled treatment:       Reason Eval/Treat Not Completed: Other (comment) (pt eating breakfast and advised would prefer SLP to come another time)   Mills KollerKimball, Sha Amer Ann Triston Skare, MS [facility] SLP [phone]

## 2015-12-18 NOTE — Care Management Obs Status (Signed)
MEDICARE OBSERVATION STATUS NOTIFICATION   Patient Details  Name: Tara Hughes MRN: [id number] Date of Birth: January 20, 1926   Medicare Observation Status Notification Given:  Yes    Kermit BaloKelli F Jermery Caratachea, RN 12/18/2015, 2:25 PM

## 2015-12-18 NOTE — Care Management Note (Signed)
Case Management Note  Patient Details  Name: Tara Hughes MRN: [id number] Date of Birth: [birth year]  Subjective/Objective:                    Action/Plan: Pt's family decided to take patient home with [facility] services. Per son patient already has a private duty aide that assists with care. CM met with the patient and her son and sister and presented them with a list of [facility] agencies in the [place] area. They selected [facility]. Abby with [facility] notified and accepted the referral. Pts son also interested in a lift for home. Marie with [facility] DME notified and the equipment will be delivered to the home. Family also would like PTAR transportation home. CM arranged transport and will place transport sheet on the chart. Bedside RN updated.   Expected Discharge Date:  12/25/15               Expected Discharge Plan:  [place]  In-House Referral:     Discharge planning Services  CM Consult  Post Acute Care Choice:  Durable Medical Equipment, Home Health Choice offered to:  Sibling  DME Arranged:   (lift) DME Agency:  [place]:  PT, Social Work [employer] Agency:   ([facility])  Status of Service:  Completed, signed off  If discussed at [employer], dates discussed:    Additional Comments:  Pollie Friar, RN 12/18/2015, 2:53 PM

## 2015-12-18 NOTE — Progress Notes (Signed)
Discharge instructions given to patient and family. Verbalized understanding. Await PTAR for transport.

## 2015-12-18 NOTE — Clinical Social Work Note (Signed)
Clinical Social Work Assessment  Patient Details  Name: Tara Hughes MRN: [id number] Date of Birth: Sep 06, 1925  Date of referral:                  Reason for consult:  Facility Placement                Permission sought to share information with:  Family Supports Permission granted to share information::  Yes, Verbal Permission Granted  Housing/Transportation Living arrangements for the past 2 months:  Single Family Home Source of Information:  Adult Children, Patient, Other (Comment Required) (sister) Patient Interpreter Needed:  None Criminal Activity/Legal Involvement Pertinent to Current Situation/Hospitalization:  No - Comment as needed Significant Relationships:  Adult Children, Siblings Lives with:  Adult Children Do you feel safe going back to the place where you live?  Yes Need for family participation in patient care:  Yes (Comment)  Care giving concerns:  No care giving concerns identified.   Social Worker assessment / plan:  CSW met with pt to address consult for SNF. Pt is under OBS and is Medicare. CSW explained the 3 night inpatient qualifying stay. CSW explained discharge options, as PT is recommending SNF. After visiting a facility, pt's family would like more time to place pt as they are prepared to pay privately. CSW notified RNCM, who followed for discharge planning needs. CSW recommended SW to assist with placement. MD is aware. CSW is signing off as not further needs identified.   Employment status:  Retired Forensic scientist:  Medicare PT Recommendations:  [place] / Referral to community resources:  [place]  Patient/Family's Response to care:  Pt and family were appreciative of CSW support.   Patient/Family's Understanding of and Emotional Response to Diagnosis, Current Treatment, and Prognosis:  Pt's family understands that she needs placement and would be private pay, however will take time to place her.    Emotional Assessment Appearance:  Appears stated age Attitude/Demeanor/Rapport:   (Apprpropiate) Affect (typically observed):  Pleasant Orientation:  Oriented to Self, Oriented to Place Alcohol / Substance use:  Never Used Psych involvement (Current and /or in the community):  No (Comment)  Discharge Needs  Concerns to be addressed:  No discharge needs identified Readmission within the last 30 days:  No Current discharge risk:  None Barriers to Discharge:  No Barriers Identified   Darden Dates, LCSW 12/18/2015, 5:39 PM

## 2015-12-19 LAB — FOLATE RBC
FOLATE, RBC: 832 ng/mL (ref >498)
Folate, Hemolysate: 277.8 ng/mL
HEMATOCRIT: 33.4 % — AB (ref 34.0–46.6)

## 2015-12-20 DIAGNOSIS — Z7901 Long term (current) use of anticoagulants: Secondary | ICD-10-CM | POA: Diagnosis not present

## 2015-12-20 DIAGNOSIS — I5032 Chronic diastolic (congestive) heart failure: Secondary | ICD-10-CM | POA: Diagnosis not present

## 2015-12-20 DIAGNOSIS — Z950 Presence of cardiac pacemaker: Secondary | ICD-10-CM | POA: Diagnosis not present

## 2015-12-20 DIAGNOSIS — M6281 Muscle weakness (generalized): Secondary | ICD-10-CM | POA: Diagnosis not present

## 2015-12-20 DIAGNOSIS — F329 Major depressive disorder, single episode, unspecified: Secondary | ICD-10-CM | POA: Diagnosis not present

## 2015-12-20 DIAGNOSIS — I480 Paroxysmal atrial fibrillation: Secondary | ICD-10-CM | POA: Diagnosis not present

## 2015-12-20 DIAGNOSIS — F0390 Unspecified dementia without behavioral disturbance: Secondary | ICD-10-CM | POA: Diagnosis not present

## 2015-12-20 DIAGNOSIS — I110 Hypertensive heart disease with heart failure: Secondary | ICD-10-CM | POA: Diagnosis not present

## 2015-12-25 ENCOUNTER — Encounter: Payer: Self-pay | Admitting: Nurse Practitioner

## 2015-12-25 ENCOUNTER — Ambulatory Visit (INDEPENDENT_AMBULATORY_CARE_PROVIDER_SITE_OTHER): Payer: Medicare Other | Admitting: Nurse Practitioner

## 2015-12-25 VITALS — BP 148/68 | HR 67 | Temp 98.0°F | Resp 17

## 2015-12-25 DIAGNOSIS — L8992 Pressure ulcer of unspecified site, stage 2: Secondary | ICD-10-CM | POA: Diagnosis not present

## 2015-12-25 DIAGNOSIS — I5032 Chronic diastolic (congestive) heart failure: Secondary | ICD-10-CM

## 2015-12-25 DIAGNOSIS — Z23 Encounter for immunization: Secondary | ICD-10-CM

## 2015-12-25 DIAGNOSIS — R531 Weakness: Secondary | ICD-10-CM | POA: Diagnosis not present

## 2015-12-25 DIAGNOSIS — F0390 Unspecified dementia without behavioral disturbance: Secondary | ICD-10-CM | POA: Diagnosis not present

## 2015-12-25 DIAGNOSIS — T148 Other injury of unspecified body region: Secondary | ICD-10-CM | POA: Diagnosis not present

## 2015-12-25 DIAGNOSIS — I4891 Unspecified atrial fibrillation: Secondary | ICD-10-CM | POA: Diagnosis not present

## 2015-12-25 DIAGNOSIS — T148XXA Other injury of unspecified body region, initial encounter: Secondary | ICD-10-CM

## 2015-12-25 LAB — BASIC METABOLIC PANEL WITH GFR
BUN: 19 mg/dL (ref 7–25)
CHLORIDE: 102 mmol/L (ref 98–110)
CO2: 26 mmol/L (ref 20–31)
CREATININE: 0.8 mg/dL (ref 0.60–0.88)
Calcium: 9 mg/dL (ref 8.6–10.4)
GFR, Est African American: 76 mL/min (ref >=60)
GFR, Est Non African American: 66 mL/min (ref >=60)
Glucose, Bld: 91 mg/dL (ref 65–99)
POTASSIUM: 4.1 mmol/L (ref 3.5–5.3)
SODIUM: 138 mmol/L (ref 135–146)

## 2015-12-25 NOTE — Patient Instructions (Signed)
Avoid pressure to toes and heels To use duoderm weekly for protectant

## 2015-12-25 NOTE — Progress Notes (Signed)
Careteam: Patient Care Team: [name], DO as PCP - General (Internal Medicine)  Advanced Directive information    No Known Allergies  No chief complaint on file.    HPI: Patient is a 82 y.o. female seen in the office today for hospital follow up. Ms Sterbenz has a medical history significant forparoxysmal atrial fibrillation on Eliquis, chronic diastolic CHF, dementia, hypertension, and GERD who went to the ED  for evaluation of generalized weakness with pain and swelling of the left leg. Patient lives with her son and had been in her usual state of health until approximately 2 weeks ago when her son noted the decreased activity level and generalized weakness. It was felt that the weakness, debility and decline was due to deconditioning, dementia and possible parkinsonism. She was under 24 hour Obs and therefore she was unable to go to SNF for rehab, instead she was transferred home with home health.  It was also noted pt had chronic right basal ganglia lacunar infarct.  Neurology was consulted and noted lesion on the prior CT from March 2017. Unable to get MRI due to pacer. She remains on eliquis.   Echo showed grade 1 diastolic dysfunction, ejection fraction 65-70%  Home health has visited her at home. Getting PT/OT.  No improvement in strength noted.  Left heel is sore and painful, burning at night- tylenol and muscle rub which is helping her.  Both feet hurt at times but mostly it is just the left. Bothers her during the day and evening.   No recent falls. Does not stand.  Did not wish for her to go to inpatient rehab because sisters does not feel like she would be motivated if her family wasn't there to encourage it.   Would like flu vaccine today   Review of Systems:  Review of Systems  Unable to perform ROS: Dementia    Past Medical History:  Diagnosis Date  . Arthritis   . Chronic diastolic CHF (congestive heart failure) (HCC)    a. 08/2014: EF 65-70%  with Grade 1 DD   . Dementia   . GERD (gastroesophageal reflux disease)   . High blood pressure   . High cholesterol   . Pacemaker-Biotronik 2006  . Paroxysmal atrial fibrillation (HCC) 09/13/2014  . PERIPHERAL EDEMA 09/13/2009   Qualifier: Diagnosis of  By: Jonny Ruiz MD, Len Blalock    Past Surgical History:  Procedure Laterality Date  . ABDOMINAL HYSTERECTOMY    . back operation    . CHOLECYSTECTOMY    . INSERT / REPLACE / REMOVE PACEMAKER    . TONSILLECTOMY     Social History:   reports that she has never smoked. She has never used smokeless tobacco. She reports that she does not drink alcohol or use drugs.  Family History  Problem Relation Age of Onset  . Cancer Father   . Diabetes Brother   . Diverticulitis Sister   . Heart disease Sister     Medications: Patient's Medications  New Prescriptions   No medications on file  Previous Medications   ACETAMINOPHEN (TYLENOL) 500 MG TABLET    Take 1,000 mg by mouth every 6 (six) hours as needed for moderate pain.   AMIODARONE (PACERONE) 200 MG TABLET    Take 1 tablet (200 mg total) by mouth 2 (two) times daily.   APIXABAN (ELIQUIS) 5 MG TABS TABLET    Take 1 tablet (5 mg total) by mouth 2 (two) times daily.   ATORVASTATIN (LIPITOR) 40 MG  TABLET    Take 1 tablet (40 mg total) by mouth daily at 6 PM.   FUROSEMIDE (LASIX) 40 MG TABLET    Take 1 tablet (40 mg total) by mouth daily.   OMEPRAZOLE (PRILOSEC) 40 MG CAPSULE    Take 1 capsule (40 mg total) by mouth daily.   POLYETHYLENE GLYCOL POWDER (GLYCOLAX/MIRALAX) POWDER    Take 17 g by mouth daily. Dissolve on capful of powder into any fluid and drink once daily. May increase to two times daily if no effect in 3 days.   POTASSIUM CHLORIDE (K-DUR,KLOR-CON) 10 MEQ TABLET    Take 1 tablet (10 mEq total) by mouth daily.  Modified Medications   No medications on file  Discontinued Medications   No medications on file     Physical Exam:  There were no vitals filed for this visit. There is  no height or weight on file to calculate BMI.  Physical Exam  Constitutional: She appears well-developed.  Cardiovascular: Normal rate, regular rhythm and normal heart sounds.  Exam reveals no gallop and no friction rub.   No murmur heard. Pulmonary/Chest: Effort normal and breath sounds normal. No respiratory distress. She has no wheezes. She has no rales.  Abdominal: Soft. Bowel sounds are normal.  Musculoskeletal: She exhibits edema (1+ bilaterally).  Neck flexion contracture  Neurological: She is alert.  Skin: Skin is warm and dry.  Stage 2 pressure ulcer noted to left heel, no drainage or erythema noted  Bilateral great toe with deep tissue injury noted, skin intact  Psychiatric: She has a normal mood and affect. Her behavior is normal.    Labs reviewed: Basic Metabolic Panel:  Recent Labs  56/21/3001/27/17 1024 05/08/15 1009  12/16/15 0445 12/16/15 0446 12/17/15 0516 12/18/15 0222  NA 138 137  < > 138  --  140 138  K 4.1 4.0  < > 3.7  --  3.7 4.0  CL 100 101  < > 105  --  104 103  CO2 21 21  < > 25  --  29 28  GLUCOSE 139* 114*  < > 91  --  84 100*  BUN 10 13  < > 11  --  10 15  CREATININE 0.76 0.84  < > 0.86  --  0.89 0.94  CALCIUM 9.0 9.2  < > 8.9  --  8.9 8.9  MG 2.4*  --   --   --   --   --   --   TSH  --  4.70*  --   --  2.745  --   --   < > = values in this interval not displayed. Liver Function Tests:  Recent Labs  05/08/15 1009 05/30/15 1044 12/16/15 0445  AST 15 17 27   ALT 13 14 34  ALKPHOS 95 74 64  BILITOT 0.3 0.2* 0.4  PROT 8.1 7.7 6.6  ALBUMIN 3.9 3.2* 2.8*    Recent Labs  05/30/15 1044  LIPASE 23   No results for input(s): AMMONIA in the last 8760 hours. CBC:  Recent Labs  12/16/15 0022 12/16/15 0046 12/16/15 0445 12/17/15 0516 12/18/15 0222  WBC 8.7  --   --  5.8 8.1  NEUTROABS 5.5  --   --  3.2 4.9  HGB 12.4 13.6  --  10.4* 11.8*  HCT 37.8 40.0 33.4* 32.3* 36.1  MCV 92.6  --   --  92.3 93.0  PLT 259  --   --  202 231   Lipid  Panel:  Recent Labs  12/16/15 1038  CHOL 192  HDL 45  LDLCALC 132*  TRIG 76  CHOLHDL 4.3   TSH:  Recent Labs  05/08/15 1009 12/16/15 0446  TSH 4.70* 2.745   A1C: Lab Results  Component Value Date   HGBA1C 5.5 12/16/2015     Assessment/Plan 1. Chronic diastolic CHF (congestive heart failure) (HCC) -euvolemic, lasix was increased to 40 mg daily, conts on potassium supplement. Will follow up lab - BASIC METABOLIC PANEL WITH GFR  2. Atrial fibrillation with RVR (HCC) Rate controlled, conts on eliquis and amiodarone   3. Dementia, without behavioral disturbance Advanced dementia. Requiring increase assistance at home. Lives with son and daughter in law who provide supportive care.   4. Generalized weakness Due to progressive dementia and deconditioning, conts with home health, hospital recommended SNF but family felt like she would not be motivated to participate if she went to SNF for rehab.    5. Deep tissue injury Noted to bilateral great toes, encouraged pressure reduction, shoes may be causing pressure on toes causing injury. To monitor and notify if worsens  6. Pressure ulcer stage II -to elevate heels, use duoderm for protectant.  -encouraged good nutrition and proper protein intake.  -to notify if ulcer worsens or does not heal  7. Need for immunization against influenza - Flu Vaccine QUAD 36+ mos PF IM (Fluarix & Fluzone Quad PF)  To follow up with Dr Montez Morita in 2 months, sooner if needed   Jessica K. Biagio Borg  [facility] [phone] 8 am - 5 pm) [phone] (after hours)

## 2016-01-03 ENCOUNTER — Telehealth: Payer: Self-pay | Admitting: *Deleted

## 2016-01-03 NOTE — Telephone Encounter (Signed)
French Anaracy with [facility] called and stated that Shanda BumpsJessica saw patient on the 25th for Left Heel Wound Stage II and given Duoderm to apply. Wound is now opened and wants to know if they should continue using the Duoderm. Please Advise.

## 2016-01-04 NOTE — Telephone Encounter (Signed)
Let's continue with medihoney and duoderm.  Thanks.

## 2016-01-04 NOTE — Telephone Encounter (Signed)
Discussed recommendations with Kennith Centerracey

## 2016-01-04 NOTE — Telephone Encounter (Signed)
Does she suggest we stop using the dressing?  It's hard to determine an intervention without seeing the wound again.

## 2016-01-04 NOTE — Telephone Encounter (Signed)
No drainage, wound still open. Kennith Centerracey recommends continue with Duoderm or Medihoney or Both.   Please advise

## 2016-01-09 ENCOUNTER — Encounter: Payer: Self-pay | Admitting: Internal Medicine

## 2016-01-09 ENCOUNTER — Telehealth: Payer: Self-pay | Admitting: *Deleted

## 2016-01-09 DIAGNOSIS — L602 Onychogryphosis: Secondary | ICD-10-CM

## 2016-01-09 NOTE — Telephone Encounter (Signed)
Would like a referral to the [facility] for her toenails. Please Advise.

## 2016-01-09 NOTE — Progress Notes (Signed)
This encounter was created in error - please disregard.

## 2016-01-09 NOTE — Telephone Encounter (Signed)
ok as requested

## 2016-01-09 NOTE — Telephone Encounter (Signed)
Order placed

## 2016-01-24 ENCOUNTER — Ambulatory Visit (INDEPENDENT_AMBULATORY_CARE_PROVIDER_SITE_OTHER): Payer: Medicare Other | Admitting: Podiatry

## 2016-01-24 DIAGNOSIS — I739 Peripheral vascular disease, unspecified: Secondary | ICD-10-CM | POA: Diagnosis not present

## 2016-01-24 DIAGNOSIS — L97529 Non-pressure chronic ulcer of other part of left foot with unspecified severity: Secondary | ICD-10-CM

## 2016-01-24 DIAGNOSIS — I83025 Varicose veins of left lower extremity with ulcer other part of foot: Secondary | ICD-10-CM

## 2016-01-24 DIAGNOSIS — L97422 Non-pressure chronic ulcer of left heel and midfoot with fat layer exposed: Secondary | ICD-10-CM | POA: Diagnosis not present

## 2016-01-24 DIAGNOSIS — B351 Tinea unguium: Secondary | ICD-10-CM

## 2016-01-24 DIAGNOSIS — I83015 Varicose veins of right lower extremity with ulcer other part of foot: Secondary | ICD-10-CM

## 2016-01-24 DIAGNOSIS — L97522 Non-pressure chronic ulcer of other part of left foot with fat layer exposed: Secondary | ICD-10-CM

## 2016-01-24 DIAGNOSIS — L97512 Non-pressure chronic ulcer of other part of right foot with fat layer exposed: Secondary | ICD-10-CM

## 2016-01-24 DIAGNOSIS — L97519 Non-pressure chronic ulcer of other part of right foot with unspecified severity: Secondary | ICD-10-CM

## 2016-01-24 NOTE — Progress Notes (Signed)
   Subjective:    Patient ID: Tara Hughes, female    DOB: [birth year], 82 y.o.   MRN: [id number]  HPI    Review of Systems  Constitutional: Positive for activity change and appetite change.  HENT: Positive for hearing loss.   Eyes: Positive for visual disturbance.  Cardiovascular: Positive for leg swelling.  Gastrointestinal: Positive for constipation.  Endocrine: Positive for cold intolerance.  Musculoskeletal: Positive for back pain.  Skin: Positive for wound.  Neurological: Positive for tremors and weakness.  Psychiatric/Behavioral: Positive for confusion and hallucinations.  All other systems reviewed and are negative.      Objective:   Physical Exam        Assessment & Plan:

## 2016-01-25 ENCOUNTER — Telehealth: Payer: Self-pay | Admitting: *Deleted

## 2016-01-25 DIAGNOSIS — T148XXA Other injury of unspecified body region, initial encounter: Secondary | ICD-10-CM

## 2016-01-25 NOTE — Telephone Encounter (Addendum)
-----   Message from Tara Hughes, DPM sent at 01/24/2016 10:55 AM EDT ----- Regarding: Multiple orders for patient... Patient is home bound and unable to travel.   1. MRI left leg (tib/fib) Dx : palpable nodule left posterior leg.   2. Home Health Care - Dressing changes ([facility]) 3x/week x 6 weeks.  Dx : ulcer left heel. B/l great toe ulcers secondary to pressure.  - Cleanse wounds with Normal Saline. Dry.  - Triple antibiotic ointment to great toe ulcers with bandaid. Reapply silicone toe caps.  - Apply moistened prisma to left posterior heel ulcer. Nonadherant dressing. Gauze. Offloading heel cup. Large kerlex. Light ace wrap.    3. Home Physical Therapy. Patient is wheelchair/bed bound. - Increase joint mobility bilateral lower extremities.  - Recommend deep tissue circulatory massage bilateral legs.  THANKS! Dr. Logan Hughes. 01/25/2016-Emailed confirmation to Tara Hughes - [facility], that Dr. Logan Hughes referred to her Home Health Care agency. [facility]. 01/25/2016-Pt's son, Tara Hughes states [facility] said pt can not get MRI has pacemaker. Informed pt's son, Tara Hughes that I would inform Dr. Logan Hughes and call in his alternate order. Tara Hughes states understanding. 01/29/2016-Pt's son, Tara Hughes states pt was to get home health care. I reviewed the charting and Tara Hughes - [facility] accepted the orders on EPIC email. [facility]. Faxed copy of Dr. Logan Hughes 01/25/2016 home health and physical therapy orders to [facility]. 01/31/2016-Pt's son, Tara Hughes states [facility] PT needs an explanation of expectations for pt, such as is she to just get up to bathroom activities and bed, and is she to ROM of joints and what does deep muscle massage entail. 02/01/2016-Faxed addendum to PT from Dr. Logan Hughes - ROM to joints of lower extremities, massage for circulation, and training pt for safe transfer to chair, bed, bathroom. 03/14/2016-Tara Hughes - [facility] states  she is with pt and the left heel wound has been treated with antibiotic ointment, but is now open with hard edges without drainage. I asked Tara Hughes if she felt it looked like a fissure and she said yes. I told her to continue the antibiotic ointment, until I called again with change of orders.

## 2016-01-25 NOTE — Progress Notes (Signed)
Patient ID: Tara Hughes, female   DOB: 1925/12/26, 82 y.o.   MRN: [id number] Subjective:  82 year old female presents today with her family for evaluation of a left heel ulcer bilateral great toe ulcerations. Patient is also concerned for a palpable mass to the left leg posteriorly. Patient presents today for further treatment and evaluation    Objective/Physical Exam General: The patient is alert and oriented x3 in no acute distress.  Dermatology: Bilateral distal tuft great toe ulcerations are noted and similar in size shape and appearance. The wounds measure approximately 0.5 cm in length 0.5 cm in width 0.1 cm in depth.   To the noted ulcerations, there is no eschar there is a minimal amount of slough fibrin and necrotic tissue noted. Granulation tissue wound base is red. There is no exposed bone muscle-tendon ligament or joint. There is a minimal amount of serosanguineous drainage noted. Periwound integrity is intact.  Wound #3 noted to the posterior aspect of the left heel. There is no eschar. The wound base is 100% fibrotic. There is no granulation tissue noted. There is a minimal amount of serous drainage noted. There is no exposed bone muscle-tendon ligament or joint. Periwound integrity is intact.  Skin is otherwise warm, dry and supple bilateral lower extremities. Negative for open lesions or macerations.  Vascular: Faintly palpable pedal pulses bilaterally. No edema or erythema noted.   Neurological: Epicritic and protective threshold diminished bilaterally.   Musculoskeletal Exam: Palpable mass noted to the posterior aspect of the left leg approximately 4 cm in diameter. Painful on palpation. Range of motion within normal limits to all pedal and ankle joints bilateral. Patient is wheelchair-bound.   Assessment: #1 distal tuft ulcer right great toe secondary to venous insufficiency and pressure  #2 distal tuft ulcer left great toe secondary to venous insufficiency and  pressure #3 ulcer left posterior heel secondary to venous insufficiency and pressure #4 venous insufficiency bilateral lower extremities #5 PVD bilateral lower extremities   Plan of Care:  #1 Patient was evaluated. #2 medically necessary excisional debridement including subcutaneous tissues performed using a tissue nipper and curette. Excisional debridement of all the necrotic nonviable tissue down to healthy bleeding viable tissue was performed with post-debridement measurements same as pre- #3 wounds were cleansed with dry sterile dressing applied. #4 orders were placed today for wound care at home every other day and physical therapy at home every other day through [facility] #5 MRI left leg ordered today for visualization of left leg nodule #6 patient is to return to clinic in 3 weeks   Dr. Felecia ShellingBrent M. Evans, DPM [facility]

## 2016-01-26 NOTE — Telephone Encounter (Signed)
Yes, ultrasound would be perfect. Thanks for catching that! Dr. Logan BoresEvans

## 2016-01-31 ENCOUNTER — Telehealth: Payer: Self-pay | Admitting: *Deleted

## 2016-01-31 NOTE — Telephone Encounter (Signed)
Appointment scheduled with Dr. Montez Moritaarter for Friday

## 2016-01-31 NOTE — Telephone Encounter (Signed)
Please make appt for pt to be seen

## 2016-01-31 NOTE — Telephone Encounter (Signed)
Patient son, Delila Pereyrayrone called and stated that patient is sleeping 16-17 hours/day and has gotten weak and cannot stand on her own. Son is having to do all the lifting and states her "power" is gone. Son is thinking it is coming from her medications. Son wants to speak with you directly regarding his mother and wants you to call him at # [phone]

## 2016-02-02 ENCOUNTER — Encounter: Payer: Self-pay | Admitting: Internal Medicine

## 2016-02-02 ENCOUNTER — Ambulatory Visit (INDEPENDENT_AMBULATORY_CARE_PROVIDER_SITE_OTHER): Payer: Medicare Other | Admitting: Internal Medicine

## 2016-02-02 VITALS — BP 128/68 | HR 79 | Temp 97.6°F

## 2016-02-02 DIAGNOSIS — I10 Essential (primary) hypertension: Secondary | ICD-10-CM

## 2016-02-02 DIAGNOSIS — R441 Visual hallucinations: Secondary | ICD-10-CM | POA: Diagnosis not present

## 2016-02-02 DIAGNOSIS — R3989 Other symptoms and signs involving the genitourinary system: Secondary | ICD-10-CM

## 2016-02-02 DIAGNOSIS — G3183 Dementia with Lewy bodies: Secondary | ICD-10-CM | POA: Diagnosis not present

## 2016-02-02 DIAGNOSIS — I480 Paroxysmal atrial fibrillation: Secondary | ICD-10-CM | POA: Diagnosis not present

## 2016-02-02 DIAGNOSIS — R2242 Localized swelling, mass and lump, left lower limb: Secondary | ICD-10-CM | POA: Diagnosis not present

## 2016-02-02 DIAGNOSIS — F0280 Dementia in other diseases classified elsewhere without behavioral disturbance: Secondary | ICD-10-CM | POA: Diagnosis not present

## 2016-02-02 DIAGNOSIS — R531 Weakness: Secondary | ICD-10-CM | POA: Diagnosis not present

## 2016-02-02 LAB — CBC WITH DIFFERENTIAL/PLATELET
BASOS ABS: 0 {cells}/uL (ref 0–200)
Basophils Relative: 0 %
EOS PCT: 1 %
Eosinophils Absolute: 74 cells/uL (ref 15–500)
HCT: 37.1 % (ref 35.0–45.0)
Hemoglobin: 12.6 g/dL (ref 11.7–15.5)
LYMPHS PCT: 21 %
Lymphs Abs: 1554 cells/uL (ref 850–3900)
MCH: 30.4 pg (ref 27.0–33.0)
MCHC: 34 g/dL (ref 32.0–36.0)
MCV: 89.6 fL (ref 80.0–100.0)
MONOS PCT: 13 %
MPV: 12.7 fL — AB (ref 7.5–12.5)
Monocytes Absolute: 962 cells/uL — ABNORMAL HIGH (ref 200–950)
NEUTROS ABS: 4810 {cells}/uL (ref 1500–7800)
Neutrophils Relative %: 65 %
PLATELETS: 187 10*3/uL (ref 140–400)
RBC: 4.14 MIL/uL (ref 3.80–5.10)
RDW: 16.1 % — ABNORMAL HIGH (ref 11.0–15.0)
WBC: 7.4 10*3/uL (ref 3.8–10.8)

## 2016-02-02 NOTE — Patient Instructions (Signed)
Continue current medications as ordered  Will call with lab results. Son will bring back urine sample  Redirection for hallucinations  Continue home therapy  Follow up as scheduled

## 2016-02-02 NOTE — Progress Notes (Signed)
Patient ID: Tara Hughes, female   DOB: 05-May-1925, 82 y.o.   MRN: [id number]    Location:  [facility] Place of Service: OFFICE  Chief Complaint  Patient presents with  . Medication Management    wants to know side effects of meds All meds  . Foot Problem    Ulcers on the left heel  . Leg Problem    knot on left calf  . Hallucinations    HPI:  82 yo female seen today for acute visit. Family reports increased weakness, lethargy and hallucinations. She is seeing podiatry for left heel ulcer and recently was seen for knot on her calf. [place] left leg ordered to eval. She had a LLE US venous doppler in Sept that was neg for DVT. Urination ok but urine dark colored.   Pt is a poor historian due to dementia. Hx obtained from chart  Edema - fluctuates. She takes lasix daily along Potassium supplement  HTN - BP stable lisinopril and lasix  GERD - stable on omeprazole daily. She has intermittent belching and epigastric pain. She takes Tums prn.   PAF - rate controlled on amiodarone. Takes eiliquis  OA - she is w/c bound and has UE and neck contractures  Constipation - stable on miralax and colace  Dementia/hx hallucinations - she appears to be declining in functional status. She has to be cued to participate in ADLs and tx. She does not take any meds  Past Medical History:  Diagnosis Date  . Arthritis   . Chronic diastolic CHF (congestive heart failure) ([place])    a. 08/2014: EF 65-70% with Grade 1 DD   . Dementia   . GERD (gastroesophageal reflux disease)   . High blood pressure   . High cholesterol   . Pacemaker-Biotronik 2006  . Paroxysmal atrial fibrillation ([place]) 09/13/2014  . PERIPHERAL EDEMA 09/13/2009   Qualifier: Diagnosis of  By: Jenny Reichmann MD, Hunt Oris     Past Surgical History:  Procedure Laterality Date  . ABDOMINAL HYSTERECTOMY    . back operation    . CHOLECYSTECTOMY    . INSERT / REPLACE / REMOVE PACEMAKER    . TONSILLECTOMY      Patient Care Team: Gildardo Cranker, DO  as PCP - General (Internal Medicine)  Social History   Social History  . Marital status: Widowed    Spouse name: N/A  . Number of children: N/A  . Years of education: N/A   Occupational History  . Not on file.   Social History Main Topics  . Smoking status: Never Smoker  . Smokeless tobacco: Never Used  . Alcohol use No  . Drug use: No  . Sexual activity: No   Other Topics Concern  . Not on file   Social History Narrative   Diet:      Do you drink/ eat things with caffeine? Coffee,soda      Marital status: Widow                              What year were you married ?      Do you live in a house, apartment,assistred living, condo, trailer, etc.)?Yes, House      Is it one or more stories? 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                              Type & how often:      Do you have a living will? No      Do you have a DNR form?  No                      If not, do you want to discuss one? No      Do you have signed POA?HPOA forms? Yes                If so, please bring to your        appointment           reports that she has never smoked. She has never used smokeless tobacco. She reports that she does not drink alcohol or use drugs.  Family History  Problem Relation Age of Onset  . Cancer Father   . Diabetes Brother   . Diverticulitis Sister   . Heart disease Sister    Family Status  Relation Status  . Father Deceased at age 71  . Mother Deceased at age 50  . Brother Deceased at age 75  . Sister Deceased at age 48  . Sister Deceased at age 77  . Brother Alive  . Sister Alive  . Maternal Grandmother Deceased  . Maternal Grandfather Deceased  . Paternal Grandmother Deceased  . Paternal Grandfather Deceased     No Known Allergies  Medications: Patient's Medications  New Prescriptions   No  medications on file  Previous Medications   ACETAMINOPHEN (TYLENOL) 500 MG TABLET    Take 1,000 mg by mouth every 6 (six) hours as needed for moderate pain.   AMIODARONE (PACERONE) 200 MG TABLET    Take 1 tablet (200 mg total) by mouth 2 (two) times daily.   APIXABAN (ELIQUIS) 5 MG TABS TABLET    Take 1 tablet (5 mg total) by mouth 2 (two) times daily.   ATORVASTATIN (LIPITOR) 40 MG TABLET    Take 1 tablet (40 mg total) by mouth daily at 6 PM.   FUROSEMIDE (LASIX) 40 MG TABLET    Take 1 tablet (40 mg total) by mouth daily.   OMEPRAZOLE (PRILOSEC) 40 MG CAPSULE    Take 1 capsule (40 mg total) by mouth daily.   POLYETHYLENE GLYCOL POWDER (GLYCOLAX/MIRALAX) POWDER    Take 17 g by mouth daily. Dissolve on capful of powder into any fluid and drink once daily. May increase to two times daily if no effect in 3 days.   POTASSIUM CHLORIDE (K-DUR,KLOR-CON) 10 MEQ TABLET    Take 1 tablet (10 mEq total) by mouth daily.  Modified Medications   No medications on file  Discontinued Medications   No medications on file    Review of Systems  Unable to perform ROS: Dementia    Vitals:   02/02/16 0854  BP: 128/68  Pulse: 79  Temp: 97.6 F (36.4 C)  TempSrc: Oral  SpO2: 97%   There is no height or weight on file to calculate BMI.  Physical Exam  Constitutional: She appears well-developed.  Frail appearing in NAD, drooling  HENT:  Mouth/Throat: Oropharynx is clear and moist. No oropharyngeal exudate.  Eyes: Pupils are equal, round, and reactive to light. No scleral icterus.  Neck: Neck supple.  Carotid bruit is not present. No tracheal deviation present. No thyromegaly present.  Cardiovascular: Normal rate, regular rhythm and intact distal pulses.  Exam reveals no gallop and no friction rub.   Murmur (1/6 SEM) heard. +1 pitting LE edema b/l. no calf TTP.   Pulmonary/Chest: Effort normal and breath sounds normal. No stridor. No respiratory distress. She has no wheezes. She has no rales.    Abdominal: Soft. Bowel sounds are normal. She exhibits distension. She exhibits no mass. There is no hepatomegaly. There is tenderness (LLQ). There is no rebound and no guarding.  obese  Musculoskeletal: She exhibits edema and tenderness.  Strength 3/5 in LLE extensors and 4/5 in RLE extensors; flexors b/l 4/5 LE; grip strength intact b/l; neck flexion contracture  Lymphadenopathy:    She has no cervical adenopathy.  Neurological: She is alert.  Skin: Skin is warm and dry. No rash noted.  Palpable grape sized posterolateral left leg immobile TTP lump with scaling at suface; left heel ulcer wrapped, c/d/i; b/l 1st ulcers dsg c/d/i  Psychiatric: She has a normal mood and affect. Her behavior is normal. Her speech is slurred.     Labs reviewed: Office Visit on 12/25/2015  Component Date Value Ref Range Status  . Sodium 12/25/2015 138  135 - 146 mmol/L Final  . Potassium 12/25/2015 4.1  3.5 - 5.3 mmol/L Final  . Chloride 12/25/2015 102  98 - 110 mmol/L Final  . CO2 12/25/2015 26  20 - 31 mmol/L Final  . Glucose, Bld 12/25/2015 91  65 - 99 mg/dL Final  . BUN 12/25/2015 19  7 - 25 mg/dL Final  . Creat 12/25/2015 0.80  0.60 - 0.88 mg/dL Final   Comment:   For patients > or = 80 years of age: The upper reference limit for Creatinine is approximately 13% higher for people identified as African-American.     . Calcium 12/25/2015 9.0  8.6 - 10.4 mg/dL Final  . GFR, Est African American 12/25/2015 76  >=60 mL/min Final  . GFR, Est Non African American 12/25/2015 66  >=60 mL/min Final  Admission on 12/16/2015, Discharged on 12/18/2015  Component Date Value Ref Range Status  . WBC 12/16/2015 8.7  4.0 - 10.5 K/uL Final  . RBC 12/16/2015 4.08  3.87 - 5.11 MIL/uL Final  . Hemoglobin 12/16/2015 12.4  12.0 - 15.0 g/dL Final  . HCT 12/16/2015 37.8  36.0 - 46.0 % Final  . MCV 12/16/2015 92.6  78.0 - 100.0 fL Final  . MCH 12/16/2015 30.4  26.0 - 34.0 pg Final  . MCHC 12/16/2015 32.8  30.0 - 36.0  g/dL Final  . RDW 12/16/2015 15.3  11.5 - 15.5 % Final  . Platelets 12/16/2015 259  150 - 400 K/uL Final  . Neutrophils Relative % 12/16/2015 63  % Final  . Neutro Abs 12/16/2015 5.5  1.7 - 7.7 K/uL Final  . Lymphocytes Relative 12/16/2015 23  % Final  . Lymphs Abs 12/16/2015 2.0  0.7 - 4.0 K/uL Final  . Monocytes Relative 12/16/2015 13  % Final  . Monocytes Absolute 12/16/2015 1.1* 0.1 - 1.0 K/uL Final  . Eosinophils Relative 12/16/2015 1  % Final  . Eosinophils Absolute 12/16/2015 0.1  0.0 - 0.7 K/uL Final  . Basophils Relative 12/16/2015 0  % Final  . Basophils Absolute 12/16/2015 0.0  0.0 - 0.1 K/uL Final  . Sodium 12/16/2015 141  135 - 145 mmol/L Final  . Potassium 12/16/2015 3.8  3.5 - 5.1 mmol/L Final  .  Chloride 12/16/2015 103  101 - 111 mmol/L Final  . BUN 12/16/2015 12  6 - 20 mg/dL Final  . Creatinine, Ser 12/16/2015 0.90  0.44 - 1.00 mg/dL Final  . Glucose, Bld 60/78/9501 91  65 - 99 mg/dL Final  . Calcium, Ion 15/67/1640 1.12* 1.15 - 1.40 mmol/L Final  . TCO2 12/16/2015 27  0 - 100 mmol/L Final  . Hemoglobin 12/16/2015 13.6  12.0 - 15.0 g/dL Final  . HCT 89/12/7527 40.0  36.0 - 46.0 % Final  . Color, Urine 12/16/2015 YELLOW  YELLOW Final  . APPearance 12/16/2015 CLOUDY* CLEAR Final  . Specific Gravity, Urine 12/16/2015 1.019  1.005 - 1.030 Final  . pH 12/16/2015 5.5  5.0 - 8.0 Final  . Glucose, UA 12/16/2015 NEGATIVE  NEGATIVE mg/dL Final  . Hgb urine dipstick 12/16/2015 NEGATIVE  NEGATIVE Final  . Bilirubin Urine 12/16/2015 SMALL* NEGATIVE Final  . Ketones, ur 12/16/2015 NEGATIVE  NEGATIVE mg/dL Final  . Protein, ur 55/39/7141 NEGATIVE  NEGATIVE mg/dL Final  . Nitrite 06/77/6160 NEGATIVE  NEGATIVE Final  . Leukocytes, UA 12/16/2015 NEGATIVE  NEGATIVE Final  . B Natriuretic Peptide 12/16/2015 25.6  0.0 - 100.0 pg/mL Final  . Troponin i, poc 12/16/2015 0.00  0.00 - 0.08 ng/mL Final  . Comment 3 12/16/2015          Final   Comment: Due to the release kinetics of  cTnI, a negative result within the first hours of the onset of symptoms does not rule out myocardial infarction with certainty. If myocardial infarction is still suspected, repeat the test at appropriate intervals.   . Hgb A1c MFr Bld 12/17/2015 5.5  4.8 - 5.6 % Final   Comment: (NOTE)         Pre-diabetes: 5.7 - 6.4         Diabetes: >6.4         Glycemic control for adults with diabetes: <7.0   . Mean Plasma Glucose 12/17/2015 111  mg/dL Final   Comment: (NOTE) Performed At: [facility] [address] [place], [place] [id number] Mila Homer MD [id number]   . Cholesterol 12/16/2015 192  0 - 200 mg/dL Final  . Triglycerides 12/16/2015 76  <150 mg/dL Final  . HDL 48/38/9306 45  >40 mg/dL Final  . Total CHOL/HDL Ratio 12/16/2015 4.3  RATIO Final  . VLDL 12/16/2015 15  0 - 40 mg/dL Final  . LDL Cholesterol 12/16/2015 132* 0 - 99 mg/dL Final   Comment:        Total Cholesterol/HDL:CHD Risk Coronary Heart Disease Risk Table                     Men   Women  1/2 Average Risk   3.4   3.3  Average Risk       5.0   4.4  2 X Average Risk   9.6   7.1  3 X Average Risk  23.4   11.0        Use the calculated Patient Ratio above and the CHD Risk Table to determine the patient's CHD Risk.        ATP III CLASSIFICATION (LDL):  <100     mg/dL   Optimal  840-502  mg/dL   Near or Above                    Optimal  130-159  mg/dL   Borderline  035-573  mg/dL   High  >378  mg/dL   Very High   . Right CCA prox sys 12/18/2015 69  cm/s Final  . Right CCA prox dias 12/18/2015 14  cm/s Final  . Right cca dist sys 12/18/2015 -43  cm/s Final  . RIGHT ECA DIAS 12/18/2015 -1.00  cm/s Final  . Weight 12/17/2015 2666.68  oz Final  . BP 12/17/2015 108/85  mmHg Final  . Sodium 12/16/2015 138  135 - 145 mmol/L Final  . Potassium 12/16/2015 3.7  3.5 - 5.1 mmol/L Final  . Chloride 12/16/2015 105  101 - 111 mmol/L Final  . CO2 12/16/2015 25  22 - 32 mmol/L Final  . Glucose, Bld  12/16/2015 91  65 - 99 mg/dL Final  . BUN 12/16/2015 11  6 - 20 mg/dL Final  . Creatinine, Ser 12/16/2015 0.86  0.44 - 1.00 mg/dL Final  . Calcium 12/16/2015 8.9  8.9 - 10.3 mg/dL Final  . Total Protein 12/16/2015 6.6  6.5 - 8.1 g/dL Final  . Albumin 12/16/2015 2.8* 3.5 - 5.0 g/dL Final  . AST 12/16/2015 27  15 - 41 U/L Final  . ALT 12/16/2015 34  14 - 54 U/L Final  . Alkaline Phosphatase 12/16/2015 64  38 - 126 U/L Final  . Total Bilirubin 12/16/2015 0.4  0.3 - 1.2 mg/dL Final  . GFR calc non Af Amer 12/16/2015 59* >60 mL/min Final  . GFR calc Af Amer 12/16/2015 >60  >60 mL/min Final   Comment: (NOTE) The eGFR has been calculated using the CKD EPI equation. This calculation has not been validated in all clinical situations. eGFR's persistently <60 mL/min signify possible Chronic Kidney Disease.   . Anion gap 12/16/2015 8  5 - 15 Final  . TSH 12/16/2015 2.745  0.350 - 4.500 uIU/mL Final  . RPR Ser Ql 12/17/2015 Non Reactive  Non Reactive Final   Comment: (NOTE) Performed At: [facility] [place], [place] [id number] Lindon Romp MD UK:[id number]   . HIV Screen 4th Generation wRfx 12/17/2015 Non Reactive  Non Reactive Final   Comment: (NOTE) Performed At: [facility] [place], [place] [id number] Lindon Romp MD OH:[id number]   . Folate, Hemolysate 12/19/2015 277.8  Not Estab. ng/mL Final  . Hematocrit 12/19/2015 33.4* 34.0 - 46.6 % Final  . Folate, RBC 12/19/2015 832  >498 ng/mL Final   Comment: (NOTE) Performed At: [facility] [place], [place] [id number] Lindon Romp MD [id number]   . Vitamin B-12 12/16/2015 376  180 - 914 pg/mL Final   Comment: (NOTE) This assay is not validated for testing neonatal or myeloproliferative syndrome specimens for Vitamin B12 levels.   . WBC 12/17/2015 5.8  4.0 - 10.5 K/uL Final  . RBC 12/17/2015 3.50* 3.87 - 5.11 MIL/uL Final  . Hemoglobin 12/17/2015 10.4*  12.0 - 15.0 g/dL Final   Comment: DELTA CHECK NOTED REPEATED TO VERIFY   . HCT 12/17/2015 32.3* 36.0 - 46.0 % Final  . MCV 12/17/2015 92.3  78.0 - 100.0 fL Final  . MCH 12/17/2015 29.7  26.0 - 34.0 pg Final  . MCHC 12/17/2015 32.2  30.0 - 36.0 g/dL Final  . RDW 12/17/2015 15.6* 11.5 - 15.5 % Final  . Platelets 12/17/2015 202  150 - 400 K/uL Final  . Neutrophils Relative % 12/17/2015 55  % Final  . Neutro Abs 12/17/2015 3.2  1.7 - 7.7 K/uL Final  . Lymphocytes Relative 12/17/2015 27  % Final  . Lymphs Abs 12/17/2015 1.6  0.7 - 4.0 K/uL Final  . Monocytes Relative 12/17/2015 15  % Final  . Monocytes Absolute 12/17/2015 0.8  0.1 - 1.0 K/uL Final  . Eosinophils Relative 12/17/2015 3  % Final  . Eosinophils Absolute 12/17/2015 0.2  0.0 - 0.7 K/uL Final  . Basophils Relative 12/17/2015 1  % Final  . Basophils Absolute 12/17/2015 0.0  0.0 - 0.1 K/uL Final  . Sodium 12/17/2015 140  135 - 145 mmol/L Final  . Potassium 12/17/2015 3.7  3.5 - 5.1 mmol/L Final  . Chloride 12/17/2015 104  101 - 111 mmol/L Final  . CO2 12/17/2015 29  22 - 32 mmol/L Final  . Glucose, Bld 12/17/2015 84  65 - 99 mg/dL Final  . BUN 12/17/2015 10  6 - 20 mg/dL Final  . Creatinine, Ser 12/17/2015 0.89  0.44 - 1.00 mg/dL Final  . Calcium 12/17/2015 8.9  8.9 - 10.3 mg/dL Final  . GFR calc non Af Amer 12/17/2015 56* >60 mL/min Final  . GFR calc Af Amer 12/17/2015 >60  >60 mL/min Final   Comment: (NOTE) The eGFR has been calculated using the CKD EPI equation. This calculation has not been validated in all clinical situations. eGFR's persistently <60 mL/min signify possible Chronic Kidney Disease.   . Anion gap 12/17/2015 7  5 - 15 Final  . WBC 12/18/2015 8.1  4.0 - 10.5 K/uL Final  . RBC 12/18/2015 3.88  3.87 - 5.11 MIL/uL Final  . Hemoglobin 12/18/2015 11.8* 12.0 - 15.0 g/dL Final  . HCT 12/18/2015 36.1  36.0 - 46.0 % Final  . MCV 12/18/2015 93.0  78.0 - 100.0 fL Final  . MCH 12/18/2015 30.4  26.0 - 34.0 pg Final    . MCHC 12/18/2015 32.7  30.0 - 36.0 g/dL Final  . RDW 12/18/2015 15.6* 11.5 - 15.5 % Final  . Platelets 12/18/2015 231  150 - 400 K/uL Final  . Neutrophils Relative % 12/18/2015 60  % Final  . Neutro Abs 12/18/2015 4.9  1.7 - 7.7 K/uL Final  . Lymphocytes Relative 12/18/2015 22  % Final  . Lymphs Abs 12/18/2015 1.8  0.7 - 4.0 K/uL Final  . Monocytes Relative 12/18/2015 14  % Final  . Monocytes Absolute 12/18/2015 1.2* 0.1 - 1.0 K/uL Final  . Eosinophils Relative 12/18/2015 3  % Final  . Eosinophils Absolute 12/18/2015 0.2  0.0 - 0.7 K/uL Final  . Basophils Relative 12/18/2015 1  % Final  . Basophils Absolute 12/18/2015 0.1  0.0 - 0.1 K/uL Final  . Sodium 12/18/2015 138  135 - 145 mmol/L Final  . Potassium 12/18/2015 4.0  3.5 - 5.1 mmol/L Final  . Chloride 12/18/2015 103  101 - 111 mmol/L Final  . CO2 12/18/2015 28  22 - 32 mmol/L Final  . Glucose, Bld 12/18/2015 100* 65 - 99 mg/dL Final  . BUN 12/18/2015 15  6 - 20 mg/dL Final  . Creatinine, Ser 12/18/2015 0.94  0.44 - 1.00 mg/dL Final  . Calcium 12/18/2015 8.9  8.9 - 10.3 mg/dL Final  . GFR calc non Af Amer 12/18/2015 53* >60 mL/min Final  . GFR calc Af Amer 12/18/2015 >60  >60 mL/min Final   Comment: (NOTE) The eGFR has been calculated using the CKD EPI equation. This calculation has not been validated in all clinical situations. eGFR's persistently <60 mL/min signify possible Chronic Kidney Disease.   . Anion gap 12/18/2015 7  5 - 15 Final    No results found.   Assessment/Plan  ICD-9-CM ICD-10-CM   1. Weakness generalized 780.79 R53.1 CBC with Differential/Platelets     CMP with eGFR     Urinalysis with Reflex Microscopic  2. Abnormal urine color 791.9 R39.89 Urinalysis with Reflex Microscopic  3. Skin lump of leg, left 782.2 R22.42    painful  4. Visual hallucination 368.16 R44.1   5. Lewy body dementia without behavioral disturbance 331.82 G31.83    294.10 F02.80   6. Paroxysmal atrial fibrillation (HCC) 427.31  I48.0   7. Essential hypertension, benign 401.1 I10     Continue current medications as ordered  Will call with lab results. Son will bring back urine sample  Redirection for hallucinations  Continue home therapy  Follow up as scheduled   Tamatha Gadbois S. Perlie Gold  [facility] [address] [employer], [place] [address] [phone] Cell (Monday-Friday 8 AM - 5 PM) [phone] After 5 PM and follow prompts

## 2016-02-03 LAB — COMPLETE METABOLIC PANEL WITH GFR
ALT: 112 U/L — ABNORMAL HIGH (ref 6–29)
AST: 72 U/L — ABNORMAL HIGH (ref 10–35)
Albumin: 3.5 g/dL — ABNORMAL LOW (ref 3.6–5.1)
Alkaline Phosphatase: 86 U/L (ref 33–130)
BUN: 16 mg/dL (ref 7–25)
CHLORIDE: 102 mmol/L (ref 98–110)
CO2: 25 mmol/L (ref 20–31)
Calcium: 9 mg/dL (ref 8.6–10.4)
Creat: 0.9 mg/dL — ABNORMAL HIGH (ref 0.60–0.88)
GFR, EST NON AFRICAN AMERICAN: 57 mL/min — AB (ref >=60)
GFR, Est African American: 66 mL/min (ref >=60)
GLUCOSE: 96 mg/dL (ref 65–99)
POTASSIUM: 3.9 mmol/L (ref 3.5–5.3)
SODIUM: 138 mmol/L (ref 135–146)
TOTAL PROTEIN: 7 g/dL (ref 6.1–8.1)
Total Bilirubin: 0.5 mg/dL (ref 0.2–1.2)

## 2016-02-03 LAB — URINALYSIS, MICROSCOPIC ONLY
BACTERIA UA: NONE SEEN [HPF]
Casts: NONE SEEN [LPF]
RBC / HPF: NONE SEEN RBC/HPF (ref <=2)
SQUAMOUS EPITHELIAL / LPF: NONE SEEN [HPF] (ref <=5)
WBC UA: NONE SEEN WBC/HPF (ref <=5)
Yeast: NONE SEEN [HPF]

## 2016-02-03 LAB — URINALYSIS, ROUTINE W REFLEX MICROSCOPIC
Bilirubin Urine: NEGATIVE
GLUCOSE, UA: NEGATIVE
Ketones, ur: NEGATIVE
LEUKOCYTES UA: NEGATIVE
NITRITE: NEGATIVE
PH: 6.5 (ref 5.0–8.0)
Protein, ur: NEGATIVE
SPECIFIC GRAVITY, URINE: 1.011 (ref 1.001–1.035)

## 2016-02-06 ENCOUNTER — Other Ambulatory Visit: Payer: Self-pay

## 2016-02-06 DIAGNOSIS — R748 Abnormal levels of other serum enzymes: Secondary | ICD-10-CM

## 2016-02-09 DIAGNOSIS — R748 Abnormal levels of other serum enzymes: Secondary | ICD-10-CM

## 2016-02-09 LAB — HEPATIC FUNCTION PANEL
ALK PHOS: 75 U/L (ref 33–130)
ALT: 118 U/L — ABNORMAL HIGH (ref 6–29)
AST: 90 U/L — ABNORMAL HIGH (ref 10–35)
Albumin: 3.4 g/dL — ABNORMAL LOW (ref 3.6–5.1)
BILIRUBIN DIRECT: 0.1 mg/dL (ref <=0.2)
BILIRUBIN INDIRECT: 0.4 mg/dL (ref 0.2–1.2)
BILIRUBIN TOTAL: 0.5 mg/dL (ref 0.2–1.2)
TOTAL PROTEIN: 7.1 g/dL (ref 6.1–8.1)

## 2016-02-12 ENCOUNTER — Other Ambulatory Visit: Payer: Self-pay

## 2016-02-12 DIAGNOSIS — R945 Abnormal results of liver function studies: Secondary | ICD-10-CM

## 2016-02-12 NOTE — Progress Notes (Signed)
RUQ US  This encounter was created in error - please disregard.

## 2016-02-14 ENCOUNTER — Ambulatory Visit (INDEPENDENT_AMBULATORY_CARE_PROVIDER_SITE_OTHER): Payer: Medicare Other | Admitting: Podiatry

## 2016-02-14 DIAGNOSIS — I83015 Varicose veins of right lower extremity with ulcer other part of foot: Secondary | ICD-10-CM

## 2016-02-14 DIAGNOSIS — I83025 Varicose veins of left lower extremity with ulcer other part of foot: Secondary | ICD-10-CM

## 2016-02-14 DIAGNOSIS — L97512 Non-pressure chronic ulcer of other part of right foot with fat layer exposed: Secondary | ICD-10-CM

## 2016-02-14 DIAGNOSIS — I739 Peripheral vascular disease, unspecified: Secondary | ICD-10-CM

## 2016-02-14 DIAGNOSIS — L97422 Non-pressure chronic ulcer of left heel and midfoot with fat layer exposed: Secondary | ICD-10-CM

## 2016-02-14 DIAGNOSIS — L97519 Non-pressure chronic ulcer of other part of right foot with unspecified severity: Secondary | ICD-10-CM

## 2016-02-14 DIAGNOSIS — L97529 Non-pressure chronic ulcer of other part of left foot with unspecified severity: Secondary | ICD-10-CM

## 2016-02-15 ENCOUNTER — Other Ambulatory Visit: Payer: Medicare Other

## 2016-02-26 ENCOUNTER — Other Ambulatory Visit: Payer: Medicare Other

## 2016-02-26 ENCOUNTER — Telehealth: Payer: Self-pay | Admitting: *Deleted

## 2016-02-26 DIAGNOSIS — M159 Polyosteoarthritis, unspecified: Secondary | ICD-10-CM

## 2016-02-26 DIAGNOSIS — G3183 Dementia with Lewy bodies: Secondary | ICD-10-CM

## 2016-02-26 DIAGNOSIS — I5032 Chronic diastolic (congestive) heart failure: Secondary | ICD-10-CM

## 2016-02-26 DIAGNOSIS — F0280 Dementia in other diseases classified elsewhere without behavioral disturbance: Secondary | ICD-10-CM

## 2016-02-26 DIAGNOSIS — R531 Weakness: Secondary | ICD-10-CM

## 2016-02-26 NOTE — Telephone Encounter (Signed)
Rx printed for pick up. Caregiver Notified.

## 2016-02-26 NOTE — Telephone Encounter (Signed)
Caregiver called and requested a Rx for a [facility]. Would like to pick up the Rx. Please Advise.

## 2016-02-26 NOTE — Telephone Encounter (Signed)
Ok for semi electric bed (dx chronic diastolic HF, osteoarthritis, generalized weakness, dementia)

## 2016-02-27 NOTE — Telephone Encounter (Signed)
Received form from [facility] [phone] [facility](Jamey [place]ev[place]) for Dr. Montez Moritaarter to review and sign for Hospital Bed. To be faxed back to Fax#: [phone] Face Sheet and Last OV printed and attached.

## 2016-02-28 ENCOUNTER — Ambulatory Visit (INDEPENDENT_AMBULATORY_CARE_PROVIDER_SITE_OTHER): Payer: Medicare Other | Admitting: Internal Medicine

## 2016-02-28 ENCOUNTER — Encounter: Payer: Self-pay | Admitting: Internal Medicine

## 2016-02-28 VITALS — BP 132/78 | HR 78 | Temp 97.7°F

## 2016-02-28 DIAGNOSIS — L89151 Pressure ulcer of sacral region, stage 1: Secondary | ICD-10-CM

## 2016-02-28 DIAGNOSIS — M159 Polyosteoarthritis, unspecified: Secondary | ICD-10-CM

## 2016-02-28 DIAGNOSIS — I5032 Chronic diastolic (congestive) heart failure: Secondary | ICD-10-CM

## 2016-02-28 DIAGNOSIS — G3183 Dementia with Lewy bodies: Secondary | ICD-10-CM

## 2016-02-28 DIAGNOSIS — F0280 Dementia in other diseases classified elsewhere without behavioral disturbance: Secondary | ICD-10-CM

## 2016-02-28 NOTE — Patient Instructions (Signed)
Referral for home health wound care placed.  Continue current medications as ordered  Use duoderm every other day until wound care nurse can come out to evaluate  Take tylenol ES 500mg  2 tabs 3 times daily for pain  Follow up in 1 month for reck of wound

## 2016-02-28 NOTE — Progress Notes (Signed)
Patient ID: Tara Hughes, female   DOB: [birth year], 82 y.o.   MRN: [id number]    Location:  [facility] Place of Service: OFFICE  Chief Complaint  Patient presents with  . Medical Management of Chronic Issues    2 months routine visit, here with daughter, son and sister    HPI:  82 yo female seen today for f/u. Son and pt's sister c/a open painful area on bottucks that began several days ago. Pain is interrupting sleep and appetite. She cries a lot from pain. No f/c. She is incontinent of bowel/bladder. Son has been applying desitin cream to area. She is taking nothing for pain.   Pt is a poor historian due to dementia. Hx obtained from chart  Edema - fluctuates. She takes lasix daily along Potassium supplement  Chronic diastolic HF - stable on lasix with K+ supplement  HTN - BP stable lisinopril and lasix  GERD - stable on omeprazole daily. She has intermittent belching and epigastric pain. She takes Tums prn.   PAF - rate controlled on amiodarone. Takes eiliquis  OA - she is w/c bound and has UE and neck contractures  Constipation - stable on miralax and colace  Dementia/hx hallucinations - she appears to be declining in functional status. She has to be cued to participate in ADLs and tx. She does not take any meds    Past Medical History:  Diagnosis Date  . Arthritis   . Chronic diastolic CHF (congestive heart failure) ([place])    a. 08/2014: EF 65-70% with Grade 1 DD   . Dementia   . GERD (gastroesophageal reflux disease)   . High blood pressure   . High cholesterol   . Pacemaker-Biotronik 2006  . Paroxysmal atrial fibrillation ([place]) 09/13/2014  . PERIPHERAL EDEMA 09/13/2009   Qualifier: Diagnosis of  By: Jenny Reichmann MD, Hunt Oris     Past Surgical History:  Procedure Laterality Date  . ABDOMINAL HYSTERECTOMY    . back operation    . CHOLECYSTECTOMY    . INSERT / REPLACE / REMOVE PACEMAKER    . TONSILLECTOMY      Patient Care Team: Gildardo Cranker, DO as PCP - General  (Internal Medicine)  Social History   Social History  . Marital status: Widowed    Spouse name: N/A  . Number of children: N/A  . Years of education: N/A   Occupational History  . Not on file.   Social History Main Topics  . Smoking status: Never Smoker  . Smokeless tobacco: Never Used  . Alcohol use No  . Drug use: No  . Sexual activity: No   Other Topics Concern  . Not on file   Social History Narrative   Diet:      Do you drink/ eat things with caffeine? Coffee,soda      Marital status: Widow                              What year were you married ?      Do you live in a house, apartment,assistred living, condo, trailer, etc.)?Yes, House      Is it one or more stories? 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                              Type & how often:      Do you have a living will? No      Do you have a DNR form?  No                      If not, do you want to discuss one? No      Do you have signed POA?HPOA forms? Yes                If so, please bring to your        appointment           reports that she has never smoked. She has never used smokeless tobacco. She reports that she does not drink alcohol or use drugs.  Family History  Problem Relation Age of Onset  . Cancer Father   . Diabetes Brother   . Diverticulitis Sister   . Heart disease Sister    Family Status  Relation Status  . Father Deceased at age 59  . Mother Deceased at age 51  . Brother Deceased at age 80  . Sister Deceased at age 91  . Sister Deceased at age 69  . Brother Alive  . Sister Alive  . Maternal Grandmother Deceased  . Maternal Grandfather Deceased  . Paternal Grandmother Deceased  . Paternal Grandfather Deceased     No Known Allergies  Medications: Patient's Medications  New Prescriptions   No medications on file    Previous Medications   ACETAMINOPHEN (TYLENOL) 500 MG TABLET    Take 1,000 mg by mouth every 6 (six) hours as needed for moderate pain.   AMIODARONE (PACERONE) 200 MG TABLET    Take 1 tablet (200 mg total) by mouth 2 (two) times daily.   APIXABAN (ELIQUIS) 5 MG TABS TABLET    Take 1 tablet (5 mg total) by mouth 2 (two) times daily.   FUROSEMIDE (LASIX) 40 MG TABLET    Take 1 tablet (40 mg total) by mouth daily.   OMEPRAZOLE (PRILOSEC) 40 MG CAPSULE    Take 1 capsule (40 mg total) by mouth daily.   POLYETHYLENE GLYCOL POWDER (GLYCOLAX/MIRALAX) POWDER    Take 17 g by mouth daily. Dissolve on capful of powder into any fluid and drink once daily. May increase to two times daily if no effect in 3 days.   POTASSIUM CHLORIDE (K-DUR,KLOR-CON) 10 MEQ TABLET    Take 1 tablet (10 mEq total) by mouth daily.  Modified Medications   No medications on file  Discontinued Medications   ATORVASTATIN (LIPITOR) 40 MG TABLET    Take 1 tablet (40 mg total) by mouth daily at 6 PM.    Review of Systems  Vitals:   02/28/16 1146  BP: 132/78  Pulse: 78  Temp: 97.7 F (36.5 C)  TempSrc: Oral  SpO2: 97%   There is no height or weight on file to calculate BMI.  Physical Exam  Constitutional: She appears well-developed.  Frail appearing in NAD, drooling  HENT:  Mouth/Throat: Oropharynx is clear and moist. No oropharyngeal exudate.  Eyes: Pupils are equal, round, and reactive to light. No scleral icterus.  Neck: Neck supple. Carotid bruit is not present. No tracheal deviation present. No thyromegaly present.  Cardiovascular: Normal rate, regular rhythm and intact distal pulses.  Exam reveals  no gallop and no friction rub.   Murmur (1/6 SEM) heard. +1 pitting LE edema b/l. no calf TTP.   Pulmonary/Chest: Effort normal and breath sounds normal. No stridor. No respiratory distress. She has no wheezes. She has no rales.  Abdominal: Soft. Bowel sounds are normal. She exhibits distension. She exhibits no mass.  There is no hepatomegaly. There is tenderness (LLQ). There is no rebound and no guarding.  obese  Musculoskeletal: She exhibits edema and tenderness.  Strength 3/5 in LLE extensors and 4/5 in RLE extensors; flexors b/l 4/5 LE; grip strength intact b/l; neck flexion contracture  Lymphadenopathy:    She has no cervical adenopathy.  Neurological: She is alert.  Skin: Skin is warm and dry. No rash noted.  Left buttock stage 1 ulcer, TTP but no d/c. No muscle tissue or fat seen. No redness.  Psychiatric: She has a normal mood and affect. Her behavior is normal. Her speech is slurred.     Labs reviewed: Erroneous Encounter on 02/09/2016  Component Date Value Ref Range Status  . Total Bilirubin 02/09/2016 0.5  0.2 - 1.2 mg/dL Final  . Bilirubin, Direct 02/09/2016 0.1  <=0.2 mg/dL Final  . Indirect Bilirubin 02/09/2016 0.4  0.2 - 1.2 mg/dL Final  . Alkaline Phosphatase 02/09/2016 75  33 - 130 U/L Final  . AST 02/09/2016 90* 10 - 35 U/L Final  . ALT 02/09/2016 118* 6 - 29 U/L Final  . Total Protein 02/09/2016 7.1  6.1 - 8.1 g/dL Final  . Albumin 02/09/2016 3.4* 3.6 - 5.1 g/dL Final  Office Visit on 02/02/2016  Component Date Value Ref Range Status  . WBC 02/02/2016 7.4  3.8 - 10.8 K/uL Final  . RBC 02/02/2016 4.14  3.80 - 5.10 MIL/uL Final  . Hemoglobin 02/02/2016 12.6  11.7 - 15.5 g/dL Final  . HCT 02/02/2016 37.1  35.0 - 45.0 % Final  . MCV 02/02/2016 89.6  80.0 - 100.0 fL Final  . MCH 02/02/2016 30.4  27.0 - 33.0 pg Final  . MCHC 02/02/2016 34.0  32.0 - 36.0 g/dL Final  . RDW 02/02/2016 16.1* 11.0 - 15.0 % Final  . Platelets 02/02/2016 187  140 - 400 K/uL Final  . MPV 02/02/2016 12.7* 7.5 - 12.5 fL Final  . Neutro Abs 02/02/2016 4810  1,500 - 7,800 cells/uL Final  . Lymphs Abs 02/02/2016 1554  850 - 3,900 cells/uL Final  . Monocytes Absolute 02/02/2016 962* 200 - 950 cells/uL Final  . Eosinophils Absolute 02/02/2016 74  15 - 500 cells/uL Final  . Basophils Absolute 02/02/2016 0  0 -  200 cells/uL Final  . Neutrophils Relative % 02/02/2016 65  % Final  . Lymphocytes Relative 02/02/2016 21  % Final  . Monocytes Relative 02/02/2016 13  % Final  . Eosinophils Relative 02/02/2016 1  % Final  . Basophils Relative 02/02/2016 0  % Final  . Smear Review 02/02/2016 Criteria for review not met   Final  . Sodium 02/03/2016 138  135 - 146 mmol/L Final  . Potassium 02/03/2016 3.9  3.5 - 5.3 mmol/L Final  . Chloride 02/03/2016 102  98 - 110 mmol/L Final  . CO2 02/03/2016 25  20 - 31 mmol/L Final  . Glucose, Bld 02/03/2016 96  65 - 99 mg/dL Final  . BUN 02/03/2016 16  7 - 25 mg/dL Final  . Creat 02/03/2016 0.90* 0.60 - 0.88 mg/dL Final   Comment:   For patients > or = 80 years of age: The upper  reference limit for Creatinine is approximately 13% higher for people identified as African-American.     . Total Bilirubin 02/03/2016 0.5  0.2 - 1.2 mg/dL Final  . Alkaline Phosphatase 02/03/2016 86  33 - 130 U/L Final  . AST 02/03/2016 72* 10 - 35 U/L Final  . ALT 02/03/2016 112* 6 - 29 U/L Final  . Total Protein 02/03/2016 7.0  6.1 - 8.1 g/dL Final  . Albumin 32/50/0027 3.5* 3.6 - 5.1 g/dL Final  . Calcium 20/12/1581 9.0  8.6 - 10.4 mg/dL Final  . GFR, Est African American 02/03/2016 66  >=60 mL/min Final  . GFR, Est Non African American 02/03/2016 57* >=60 mL/min Final  . Color, Urine 02/03/2016 YELLOW  YELLOW Final  . APPearance 02/03/2016 CLEAR  CLEAR Final  . Specific Gravity, Urine 02/03/2016 1.011  1.001 - 1.035 Final  . pH 02/03/2016 6.5  5.0 - 8.0 Final  . Glucose, UA 02/03/2016 NEGATIVE  NEGATIVE Final  . Bilirubin Urine 02/03/2016 NEGATIVE  NEGATIVE Final  . Ketones, ur 02/03/2016 NEGATIVE  NEGATIVE Final  . Hgb urine dipstick 02/03/2016 TRACE* NEGATIVE Final  . Protein, ur 02/03/2016 NEGATIVE  NEGATIVE Final  . Nitrite 02/03/2016 NEGATIVE  NEGATIVE Final  . Leukocytes, UA 02/03/2016 NEGATIVE  NEGATIVE Final  . WBC, UA 02/03/2016 NONE SEEN  <=5 WBC/HPF Final  . RBC /  HPF 02/03/2016 NONE SEEN  <=2 RBC/HPF Final  . Squamous Epithelial / LPF 02/03/2016 NONE SEEN  <=5 HPF Final  . Bacteria, UA 02/03/2016 NONE SEEN  NONE SEEN HPF Final  . Crystals 02/03/2016 See Below* NONE SEEN HPF Final  . Casts 02/03/2016 NONE SEEN  NONE SEEN LPF Final  . Yeast 02/03/2016 NONE SEEN  NONE SEEN HPF Final  Office Visit on 12/25/2015  Component Date Value Ref Range Status  . Sodium 12/25/2015 138  135 - 146 mmol/L Final  . Potassium 12/25/2015 4.1  3.5 - 5.3 mmol/L Final  . Chloride 12/25/2015 102  98 - 110 mmol/L Final  . CO2 12/25/2015 26  20 - 31 mmol/L Final  . Glucose, Bld 12/25/2015 91  65 - 99 mg/dL Final  . BUN 58/19/5234 19  7 - 25 mg/dL Final  . Creat 43/81/4627 0.80  0.60 - 0.88 mg/dL Final   Comment:   For patients > or = 80 years of age: The upper reference limit for Creatinine is approximately 13% higher for people identified as African-American.     . Calcium 12/25/2015 9.0  8.6 - 10.4 mg/dL Final  . GFR, Est African American 12/25/2015 76  >=60 mL/min Final  . GFR, Est Non African American 12/25/2015 66  >=60 mL/min Final  Admission on 12/16/2015, Discharged on 12/18/2015  Component Date Value Ref Range Status  . WBC 12/16/2015 8.7  4.0 - 10.5 K/uL Final  . RBC 12/16/2015 4.08  3.87 - 5.11 MIL/uL Final  . Hemoglobin 12/16/2015 12.4  12.0 - 15.0 g/dL Final  . HCT 40/25/3672 37.8  36.0 - 46.0 % Final  . MCV 12/16/2015 92.6  78.0 - 100.0 fL Final  . MCH 12/16/2015 30.4  26.0 - 34.0 pg Final  . MCHC 12/16/2015 32.8  30.0 - 36.0 g/dL Final  . RDW 45/80/0450 15.3  11.5 - 15.5 % Final  . Platelets 12/16/2015 259  150 - 400 K/uL Final  . Neutrophils Relative % 12/16/2015 63  % Final  . Neutro Abs 12/16/2015 5.5  1.7 - 7.7 K/uL Final  . Lymphocytes Relative 12/16/2015 23  %  Final  . Lymphs Abs 12/16/2015 2.0  0.7 - 4.0 K/uL Final  . Monocytes Relative 12/16/2015 13  % Final  . Monocytes Absolute 12/16/2015 1.1* 0.1 - 1.0 K/uL Final  . Eosinophils  Relative 12/16/2015 1  % Final  . Eosinophils Absolute 12/16/2015 0.1  0.0 - 0.7 K/uL Final  . Basophils Relative 12/16/2015 0  % Final  . Basophils Absolute 12/16/2015 0.0  0.0 - 0.1 K/uL Final  . Sodium 12/16/2015 141  135 - 145 mmol/L Final  . Potassium 12/16/2015 3.8  3.5 - 5.1 mmol/L Final  . Chloride 12/16/2015 103  101 - 111 mmol/L Final  . BUN 12/16/2015 12  6 - 20 mg/dL Final  . Creatinine, Ser 12/16/2015 0.90  0.44 - 1.00 mg/dL Final  . Glucose, Bld 72/12/3746 91  65 - 99 mg/dL Final  . Calcium, Ion 36/41/5557 1.12* 1.15 - 1.40 mmol/L Final  . TCO2 12/16/2015 27  0 - 100 mmol/L Final  . Hemoglobin 12/16/2015 13.6  12.0 - 15.0 g/dL Final  . HCT 77/04/7969 40.0  36.0 - 46.0 % Final  . Color, Urine 12/16/2015 YELLOW  YELLOW Final  . APPearance 12/16/2015 CLOUDY* CLEAR Final  . Specific Gravity, Urine 12/16/2015 1.019  1.005 - 1.030 Final  . pH 12/16/2015 5.5  5.0 - 8.0 Final  . Glucose, UA 12/16/2015 NEGATIVE  NEGATIVE mg/dL Final  . Hgb urine dipstick 12/16/2015 NEGATIVE  NEGATIVE Final  . Bilirubin Urine 12/16/2015 SMALL* NEGATIVE Final  . Ketones, ur 12/16/2015 NEGATIVE  NEGATIVE mg/dL Final  . Protein, ur 29/10/9646 NEGATIVE  NEGATIVE mg/dL Final  . Nitrite 47/42/2946 NEGATIVE  NEGATIVE Final  . Leukocytes, UA 12/16/2015 NEGATIVE  NEGATIVE Final  . B Natriuretic Peptide 12/16/2015 25.6  0.0 - 100.0 pg/mL Final  . Troponin i, poc 12/16/2015 0.00  0.00 - 0.08 ng/mL Final  . Comment 3 12/16/2015          Final   Comment: Due to the release kinetics of cTnI, a negative result within the first hours of the onset of symptoms does not rule out myocardial infarction with certainty. If myocardial infarction is still suspected, repeat the test at appropriate intervals.   . Hgb A1c MFr Bld 12/17/2015 5.5  4.8 - 5.6 % Final   Comment: (NOTE)         Pre-diabetes: 5.7 - 6.4         Diabetes: >6.4         Glycemic control for adults with diabetes: <7.0   . Mean Plasma Glucose  12/17/2015 111  mg/dL Final   Comment: (NOTE) Performed At: [facility] [address] [place], [place] [id number] Mila Homer MD [id number]   . Cholesterol 12/16/2015 192  0 - 200 mg/dL Final  . Triglycerides 12/16/2015 76  <150 mg/dL Final  . HDL 64/98/6922 45  >40 mg/dL Final  . Total CHOL/HDL Ratio 12/16/2015 4.3  RATIO Final  . VLDL 12/16/2015 15  0 - 40 mg/dL Final  . LDL Cholesterol 12/16/2015 132* 0 - 99 mg/dL Final   Comment:        Total Cholesterol/HDL:CHD Risk Coronary Heart Disease Risk Table                     Men   Women  1/2 Average Risk   3.4   3.3  Average Risk       5.0   4.4  2 X Average Risk   9.6   7.1  3 X Average Risk  23.4   11.0        Use the calculated Patient Ratio above and the CHD Risk Table to determine the patient's CHD Risk.        ATP III CLASSIFICATION (LDL):  <100     mg/dL   Optimal  100-129  mg/dL   Near or Above                    Optimal  130-159  mg/dL   Borderline  160-189  mg/dL   High  >190     mg/dL   Very High   . Right CCA prox sys 12/18/2015 69  cm/s Final  . Right CCA prox dias 12/18/2015 14  cm/s Final  . Right cca dist sys 12/18/2015 -43  cm/s Final  . RIGHT ECA DIAS 12/18/2015 -1.00  cm/s Final  . Weight 12/17/2015 2666.68  oz Final  . BP 12/17/2015 108/85  mmHg Final  . Sodium 12/16/2015 138  135 - 145 mmol/L Final  . Potassium 12/16/2015 3.7  3.5 - 5.1 mmol/L Final  . Chloride 12/16/2015 105  101 - 111 mmol/L Final  . CO2 12/16/2015 25  22 - 32 mmol/L Final  . Glucose, Bld 12/16/2015 91  65 - 99 mg/dL Final  . BUN 12/16/2015 11  6 - 20 mg/dL Final  . Creatinine, Ser 12/16/2015 0.86  0.44 - 1.00 mg/dL Final  . Calcium 12/16/2015 8.9  8.9 - 10.3 mg/dL Final  . Total Protein 12/16/2015 6.6  6.5 - 8.1 g/dL Final  . Albumin 12/16/2015 2.8* 3.5 - 5.0 g/dL Final  . AST 12/16/2015 27  15 - 41 U/L Final  . ALT 12/16/2015 34  14 - 54 U/L Final  . Alkaline Phosphatase 12/16/2015 64  38 - 126 U/L Final  .  Total Bilirubin 12/16/2015 0.4  0.3 - 1.2 mg/dL Final  . GFR calc non Af Amer 12/16/2015 59* >60 mL/min Final  . GFR calc Af Amer 12/16/2015 >60  >60 mL/min Final   Comment: (NOTE) The eGFR has been calculated using the CKD EPI equation. This calculation has not been validated in all clinical situations. eGFR's persistently <60 mL/min signify possible Chronic Kidney Disease.   . Anion gap 12/16/2015 8  5 - 15 Final  . TSH 12/16/2015 2.745  0.350 - 4.500 uIU/mL Final  . RPR Ser Ql 12/17/2015 Non Reactive  Non Reactive Final   Comment: (NOTE) Performed At: [facility] [place], [place] [id number] Lindon Romp MD [id number]   . HIV Screen 4th Generation wRfx 12/17/2015 Non Reactive  Non Reactive Final   Comment: (NOTE) Performed At: [facility] [place], [place] [id number] Lindon Romp MD [id number]   . Folate, Hemolysate 12/19/2015 277.8  Not Estab. ng/mL Final  . Hematocrit 12/19/2015 33.4* 34.0 - 46.6 % Final  . Folate, RBC 12/19/2015 832  >498 ng/mL Final   Comment: (NOTE) Performed At: [facility] [place], [place] [id number] Lindon Romp MD [id number]   . Vitamin B-12 12/16/2015 376  180 - 914 pg/mL Final   Comment: (NOTE) This assay is not validated for testing neonatal or myeloproliferative syndrome specimens for Vitamin B12 levels.   . WBC 12/17/2015 5.8  4.0 - 10.5 K/uL Final  . RBC 12/17/2015 3.50* 3.87 - 5.11 MIL/uL Final  . Hemoglobin 12/17/2015 10.4* 12.0 - 15.0 g/dL Final   Comment: DELTA CHECK NOTED REPEATED TO VERIFY   .  HCT 12/17/2015 32.3* 36.0 - 46.0 % Final  . MCV 12/17/2015 92.3  78.0 - 100.0 fL Final  . MCH 12/17/2015 29.7  26.0 - 34.0 pg Final  . MCHC 12/17/2015 32.2  30.0 - 36.0 g/dL Final  . RDW 12/17/2015 15.6* 11.5 - 15.5 % Final  . Platelets 12/17/2015 202  150 - 400 K/uL Final  . Neutrophils Relative % 12/17/2015 55  % Final  . Neutro Abs 12/17/2015 3.2   1.7 - 7.7 K/uL Final  . Lymphocytes Relative 12/17/2015 27  % Final  . Lymphs Abs 12/17/2015 1.6  0.7 - 4.0 K/uL Final  . Monocytes Relative 12/17/2015 15  % Final  . Monocytes Absolute 12/17/2015 0.8  0.1 - 1.0 K/uL Final  . Eosinophils Relative 12/17/2015 3  % Final  . Eosinophils Absolute 12/17/2015 0.2  0.0 - 0.7 K/uL Final  . Basophils Relative 12/17/2015 1  % Final  . Basophils Absolute 12/17/2015 0.0  0.0 - 0.1 K/uL Final  . Sodium 12/17/2015 140  135 - 145 mmol/L Final  . Potassium 12/17/2015 3.7  3.5 - 5.1 mmol/L Final  . Chloride 12/17/2015 104  101 - 111 mmol/L Final  . CO2 12/17/2015 29  22 - 32 mmol/L Final  . Glucose, Bld 12/17/2015 84  65 - 99 mg/dL Final  . BUN 12/17/2015 10  6 - 20 mg/dL Final  . Creatinine, Ser 12/17/2015 0.89  0.44 - 1.00 mg/dL Final  . Calcium 12/17/2015 8.9  8.9 - 10.3 mg/dL Final  . GFR calc non Af Amer 12/17/2015 56* >60 mL/min Final  . GFR calc Af Amer 12/17/2015 >60  >60 mL/min Final   Comment: (NOTE) The eGFR has been calculated using the CKD EPI equation. This calculation has not been validated in all clinical situations. eGFR's persistently <60 mL/min signify possible Chronic Kidney Disease.   . Anion gap 12/17/2015 7  5 - 15 Final  . WBC 12/18/2015 8.1  4.0 - 10.5 K/uL Final  . RBC 12/18/2015 3.88  3.87 - 5.11 MIL/uL Final  . Hemoglobin 12/18/2015 11.8* 12.0 - 15.0 g/dL Final  . HCT 12/18/2015 36.1  36.0 - 46.0 % Final  . MCV 12/18/2015 93.0  78.0 - 100.0 fL Final  . MCH 12/18/2015 30.4  26.0 - 34.0 pg Final  . MCHC 12/18/2015 32.7  30.0 - 36.0 g/dL Final  . RDW 12/18/2015 15.6* 11.5 - 15.5 % Final  . Platelets 12/18/2015 231  150 - 400 K/uL Final  . Neutrophils Relative % 12/18/2015 60  % Final  . Neutro Abs 12/18/2015 4.9  1.7 - 7.7 K/uL Final  . Lymphocytes Relative 12/18/2015 22  % Final  . Lymphs Abs 12/18/2015 1.8  0.7 - 4.0 K/uL Final  . Monocytes Relative 12/18/2015 14  % Final  . Monocytes Absolute 12/18/2015 1.2* 0.1 -  1.0 K/uL Final  . Eosinophils Relative 12/18/2015 3  % Final  . Eosinophils Absolute 12/18/2015 0.2  0.0 - 0.7 K/uL Final  . Basophils Relative 12/18/2015 1  % Final  . Basophils Absolute 12/18/2015 0.1  0.0 - 0.1 K/uL Final  . Sodium 12/18/2015 138  135 - 145 mmol/L Final  . Potassium 12/18/2015 4.0  3.5 - 5.1 mmol/L Final  . Chloride 12/18/2015 103  101 - 111 mmol/L Final  . CO2 12/18/2015 28  22 - 32 mmol/L Final  . Glucose, Bld 12/18/2015 100* 65 - 99 mg/dL Final  . BUN 12/18/2015 15  6 - 20 mg/dL Final  . Creatinine,  Ser 12/18/2015 0.94  0.44 - 1.00 mg/dL Final  . Calcium 12/18/2015 8.9  8.9 - 10.3 mg/dL Final  . GFR calc non Af Amer 12/18/2015 53* >60 mL/min Final  . GFR calc Af Amer 12/18/2015 >60  >60 mL/min Final   Comment: (NOTE) The eGFR has been calculated using the CKD EPI equation. This calculation has not been validated in all clinical situations. eGFR's persistently <60 mL/min signify possible Chronic Kidney Disease.   . Anion gap 12/18/2015 7  5 - 15 Final    No results found.   Assessment/Plan   ICD-9-CM ICD-10-CM   1. Decubitus ulcer of sacral region, stage 1 707.03 L89.151 For home use only DME Gel overlay   707.21  Ambulatory referral to Home Health  2. Chronic diastolic HF (heart failure) (HCC) 428.32 I50.32 For home use only DME Gel overlay     Ambulatory referral to [place]  3. Generalized OA 715.00 M15.9   4. Lewy body dementia without behavioral disturbance 331.82 G31.83    294.10 F02.80    Referral for home health wound care placed. She has used [facility] in the past  Avoid pressure on buttocks by keeping her turned frequently  Continue current medications as ordered  Use duoderm every other day until wound care nurse can come out to evaluate  Take tylenol ES '500mg'$  2 tabs 3 times daily for pain  Follow up in 1 month for reck of wound  Taralyn Ferraiolo S. Perlie Gold  [facility] [address] [place], [place] [address] [phone] Cell (Monday-Friday 8 AM - 5 PM) [phone] After 5 PM and follow prompts

## 2016-03-02 DIAGNOSIS — F329 Major depressive disorder, single episode, unspecified: Secondary | ICD-10-CM | POA: Diagnosis not present

## 2016-03-02 DIAGNOSIS — Z7901 Long term (current) use of anticoagulants: Secondary | ICD-10-CM | POA: Diagnosis not present

## 2016-03-02 DIAGNOSIS — I110 Hypertensive heart disease with heart failure: Secondary | ICD-10-CM

## 2016-03-02 DIAGNOSIS — I480 Paroxysmal atrial fibrillation: Secondary | ICD-10-CM | POA: Diagnosis not present

## 2016-03-02 DIAGNOSIS — F0390 Unspecified dementia without behavioral disturbance: Secondary | ICD-10-CM | POA: Diagnosis not present

## 2016-03-02 DIAGNOSIS — Z950 Presence of cardiac pacemaker: Secondary | ICD-10-CM | POA: Diagnosis not present

## 2016-03-02 DIAGNOSIS — I5032 Chronic diastolic (congestive) heart failure: Secondary | ICD-10-CM | POA: Diagnosis not present

## 2016-03-02 DIAGNOSIS — L89152 Pressure ulcer of sacral region, stage 2: Secondary | ICD-10-CM

## 2016-03-03 ENCOUNTER — Emergency Department (HOSPITAL_COMMUNITY)
Admission: EM | Admit: 2016-03-03 | Discharge: 2016-03-03 | Disposition: A | Discharge status: Home / Self Care | Payer: Medicare Other | Attending: Emergency Medicine | Admitting: Emergency Medicine

## 2016-03-03 ENCOUNTER — Encounter (HOSPITAL_COMMUNITY): Payer: Self-pay | Admitting: *Deleted

## 2016-03-03 DIAGNOSIS — Z7901 Long term (current) use of anticoagulants: Secondary | ICD-10-CM | POA: Diagnosis not present

## 2016-03-03 DIAGNOSIS — Z950 Presence of cardiac pacemaker: Secondary | ICD-10-CM | POA: Diagnosis not present

## 2016-03-03 DIAGNOSIS — I5032 Chronic diastolic (congestive) heart failure: Secondary | ICD-10-CM | POA: Diagnosis not present

## 2016-03-03 DIAGNOSIS — L98421 Non-pressure chronic ulcer of back limited to breakdown of skin: Secondary | ICD-10-CM

## 2016-03-03 DIAGNOSIS — I110 Hypertensive heart disease with heart failure: Secondary | ICD-10-CM | POA: Insufficient documentation

## 2016-03-03 DIAGNOSIS — L89152 Pressure ulcer of sacral region, stage 2: Secondary | ICD-10-CM | POA: Insufficient documentation

## 2016-03-03 MED ORDER — OXYCODONE HCL 5 MG PO TABS
2.5000 mg | ORAL_TABLET | Freq: Once | ORAL | Status: AC
  Administered 2016-03-03: 2.5 mg via ORAL
  Filled 2016-03-03: qty 1

## 2016-03-03 NOTE — ED Provider Notes (Signed)
[facility]-EMERGENCY DEPT Provider Note   CSN: [id number] Arrival date & time: 03/03/16  2130     History   Chief Complaint Chief Complaint  Patient presents with  . bed sores    HPI Tara Hughes is a 82 y.o. female.  82 yo F with a chief complaint of sacral ulcers. This been going on for about a week or so. She is seen by her family physician about 4 days ago. Family feels that these wounds had been getting worse. She also been complaining of lower extremity pain. This is described as a burning. Is in both legs. Has some left lower extremity swelling which is chronic. Level V caveat dimentia.    The history is provided by the patient.  Illness  This is a new problem. The current episode started more than 2 days ago. The problem occurs constantly. The problem has been gradually worsening. Pertinent negatives include no chest pain, no headaches and no shortness of breath. Nothing aggravates the symptoms. Nothing relieves the symptoms. She has tried nothing for the symptoms. The treatment provided no relief.    Past Medical History:  Diagnosis Date  . Arthritis   . Chronic diastolic CHF (congestive heart failure) (HCC)    a. 08/2014: EF 65-70% with Grade 1 DD   . Dementia   . GERD (gastroesophageal reflux disease)   . High blood pressure   . High cholesterol   . Pacemaker-Biotronik 2006  . Paroxysmal atrial fibrillation (HCC) 09/13/2014  . PERIPHERAL EDEMA 09/13/2009   Qualifier: Diagnosis of  By: Jonny Ruiz MD, Len Hughes     Patient Active Problem List   Diagnosis Date Noted  . Right-sided lacunar infarction (HCC) 12/16/2015  . Generalized weakness 12/16/2015  . Pain and swelling of left lower leg 12/16/2015  . Chronic diastolic CHF (congestive heart failure) (HCC) 12/16/2015  . Dementia 08/02/2015  . Atrial fibrillation with RVR (HCC) 04/18/2015  . Anasarca 04/18/2015  . Visual hallucination 11/23/2014  . Hyperlipidemia 11/23/2014  . Essential hypertension, benign 11/23/2014    . Paroxysmal atrial fibrillation (HCC) 09/13/2014  . Pacemaker-Biotronik 09/13/2014  . DEPRESSION 09/13/2009  . OTHER SPECIFIED FORMS OF HEARING LOSS 09/13/2009  . KNEE PAIN, BILATERAL 07/25/2009  . GERD 03/08/2009  . SHOULDER PAIN, BILATERAL 03/08/2009  . OSTEOPENIA 03/08/2009  . Other and unspecified hyperlipidemia 01/19/2009  . MORBID OBESITY 01/17/2009  . Osteoarthritis 01/17/2009    Past Surgical History:  Procedure Laterality Date  . ABDOMINAL HYSTERECTOMY    . back operation    . CHOLECYSTECTOMY    . INSERT / REPLACE / REMOVE PACEMAKER    . TONSILLECTOMY      OB History    No data available       Home Medications    Prior to Admission medications   Medication Sig Start Date End Date Taking? Authorizing Provider  acetaminophen (TYLENOL) 500 MG tablet Take 1,000 mg by mouth every 6 (six) hours as needed for moderate pain.    Historical Provider, MD  amiodarone (PACERONE) 200 MG tablet Take 1 tablet (200 mg total) by mouth 2 (two) times daily. 06/02/15   Pricilla Riffle, MD  apixaban (ELIQUIS) 5 MG TABS tablet Take 1 tablet (5 mg total) by mouth 2 (two) times daily. 06/02/15   Pricilla Riffle, MD  furosemide (LASIX) 40 MG tablet Take 1 tablet (40 mg total) by mouth daily. 12/19/15   Meredeth Ide, MD  omeprazole (PRILOSEC) 40 MG capsule Take 1 capsule (40 mg total) by  mouth daily. 06/02/15   Pricilla RifflePaula V Ross, MD  polyethylene glycol powder (GLYCOLAX/MIRALAX) powder Take 17 g by mouth daily. Dissolve on capful of powder into any fluid and drink once daily. May increase to two times daily if no effect in 3 days. 11/07/15   Kirt BoysMonica Carter, DO  potassium chloride (K-DUR,KLOR-CON) 10 MEQ tablet Take 1 tablet (10 mEq total) by mouth daily. 12/19/15   Meredeth IdeGagan S Lama, MD    Family History Family History  Problem Relation Age of Onset  . Cancer Father   . Diabetes Brother   . Diverticulitis Sister   . Heart disease Sister     Social History Social History  Substance Use Topics  . Smoking  status: Never Smoker  . Smokeless tobacco: Never Used  . Alcohol use No     Allergies   Patient has no known allergies.   Review of Systems Review of Systems  Constitutional: Negative for chills and fever.  HENT: Negative for congestion and rhinorrhea.   Eyes: Negative for redness and visual disturbance.  Respiratory: Negative for shortness of breath and wheezing.   Cardiovascular: Negative for chest pain and palpitations.  Gastrointestinal: Negative for nausea and vomiting.  Genitourinary: Negative for dysuria and urgency.  Musculoskeletal: Negative for arthralgias and myalgias.       Leg pain   Skin: Positive for wound. Negative for pallor.  Neurological: Negative for dizziness and headaches.     Physical Exam Updated Vital Signs BP (!) 116/53   Pulse 79   Temp 97.5 F (36.4 C) (Oral)   Resp 18   SpO2 99%   Physical Exam  Constitutional: She is oriented to person, place, and time. She appears well-developed and well-nourished. No distress.  HENT:  Head: Normocephalic and atraumatic.  Eyes: EOM are normal. Pupils are equal, round, and reactive to light.  Neck: Normal range of motion. Neck supple.  Cardiovascular: Normal rate and regular rhythm.  Exam reveals no gallop and no friction rub.   No murmur heard. Pulmonary/Chest: Effort normal. She has no wheezes. She has no rales.  Abdominal: Soft. She exhibits no distension and no mass. There is no tenderness. There is no guarding.  Genitourinary:  Genitourinary Comments: Erosions just through the skin, blanching  Musculoskeletal: She exhibits edema (LLE >RLE). She exhibits no tenderness.  Neurological: She is alert and oriented to person, place, and time.  Skin: Skin is warm and dry. She is not diaphoretic.  Psychiatric: She has a normal mood and affect. Her behavior is normal.  Nursing note and vitals reviewed.    ED Treatments / Results  Labs (all labs ordered are listed, but only abnormal results are  displayed) Labs Reviewed - No data to display  EKG  EKG Interpretation None       Radiology No results found.  Procedures Procedures (including critical care time)  Medications Ordered in ED Medications  oxyCODONE (Oxy IR/ROXICODONE) immediate release tablet 2.5 mg (not administered)     Initial Impression / Assessment and Plan / ED Course  I have reviewed the triage vital signs and the nursing notes.  Pertinent labs & imaging results that were available during my care of the patient were reviewed by me and considered in my medical decision making (see chart for details).  Clinical Course     82 yo F With a chief complaint of sacral ulcers. My exam they are small. Appear to be stage II. They're not so extensive that need to be admitted. I discussed with  family that these need to be dressed(as they were not on arrival), and she needs to be turned every 2 hours. The patient's leg pain is chronic been going on for many years. She recently had a DVT study of the left lower extremity which was negative. Feel no need for further workup at this time. Discussed with the family at length with risk of coming to the hospital for wounds that are being addressed as an outpatient.  10:19 AM:  I have discussed the diagnosis/risks/treatment options with the patient and family and believe the pt to be eligible for discharge home to follow-up with PCP. We also discussed returning to the ED immediately if new or worsening sx occur. We discussed the sx which are most concerning (e.g., sudden worsening pain, fever, inability to tolerate by mouth) that necessitate immediate return. Medications administered to the patient during their visit and any new prescriptions provided to the patient are listed below.  Medications given during this visit Medications  oxyCODONE (Oxy IR/ROXICODONE) immediate release tablet 2.5 mg (not administered)     The patient appears reasonably screen and/or stabilized for  discharge and I doubt any other medical condition or other [facility] requiring further screening, evaluation, or treatment in the ED at this time prior to discharge.    Final Clinical Impressions(s) / ED Diagnoses   Final diagnoses:  Skin ulcer of sacrum, limited to breakdown of skin [facility](HCC)    New Prescriptions New Prescriptions   No medications on file     Melene PlanDan Raelea Gosse, DO 03/03/16 1019

## 2016-03-03 NOTE — ED Notes (Signed)
Bed: WA09 Expected date:  Expected time:  Means of arrival:  Comments: 82 yo decubitus

## 2016-03-03 NOTE — Discharge Instructions (Signed)
Roll the patient every 2 hours.  Keep the wounds dressed.  She can take tylenol 1000mg (2 extra strength) four times a day.

## 2016-03-03 NOTE — ED Triage Notes (Addendum)
Per EMS pt from home with c/o bed sores and BLE pain. VS en route: b/p 122palp,.hr 88, cbg 165

## 2016-03-04 ENCOUNTER — Telehealth: Payer: Self-pay | Admitting: *Deleted

## 2016-03-04 NOTE — Telephone Encounter (Signed)
Tara Hughes with [facility] called and requested a Air Mattress Overlay for patient's Hospital Bed due to her wound on bottom. Would like the order faxed to [facility] Fax#: [phone]. Please Advise.

## 2016-03-04 NOTE — Telephone Encounter (Signed)
Ok. This order was faxed to [facility] last week.please check with Eunice Blaseebbie

## 2016-03-04 NOTE — Telephone Encounter (Signed)
Message Forwarded to Ameren CorporationDebbie.

## 2016-03-06 ENCOUNTER — Telehealth: Payer: Self-pay | Admitting: *Deleted

## 2016-03-06 NOTE — Telephone Encounter (Signed)
Claudius Sisyrone Moses called and wanted to know if we had a service that could come out and do patient's Ultrasound because patient cannot be transported. I told him we did not have a service to go to patient's home to perform Ultrasounds, but non emergency ambulance could transport patient to the appointment.  He wanted me to call and speak with the Fredderick Erbunt, Donna Michael #[phone] to let her know this also.  I tried calling this number but it just rings.

## 2016-03-06 NOTE — Telephone Encounter (Signed)
Dr. Montez Moritaarter wrote Rx for Air mattress overlay ICD L89.151, I50.32. Faxed to [facility] [phone].

## 2016-03-07 ENCOUNTER — Telehealth: Payer: Self-pay

## 2016-03-07 NOTE — Telephone Encounter (Signed)
Tara MillinerDaria is aware of Dr.Carter's response and stated they should be able to just switch that out with what she currently has for the qualifications are the same. Tara MillinerDaria will call if anything additional is needed

## 2016-03-07 NOTE — Telephone Encounter (Signed)
Ok for air pressure pad and pump

## 2016-03-07 NOTE — Telephone Encounter (Signed)
Tara Hughes with [facility] called to follow-up on an order received for patient to have a different mattress. Patient does not qualify for a low air loss mattress due to diagnosis of stage 1 wound.   In order to qualify patient will have to be at a stage 3 or 4.   Another option is the air pressure pad and pump that is very similar to the gel overlay that patient currently has.  Please advise

## 2016-03-09 NOTE — Progress Notes (Signed)
Patient ID: Tara Hughes, female   DOB: 1925/10/15, 82 y.o.   MRN: [id number] Subjective:  [age] year old female presents today with her family for evaluation of a left heel ulcer bilateral great toe ulcerations. Patient is also concerned for a palpable mass to the left leg posteriorly. Patient presents today for further treatment and evaluation    Objective/Physical Exam General: The patient is alert and oriented x3 in no acute distress.  Dermatology: Right distal tuft great toe ulceration is noted. The wounds measure approximately 0.3 cm in length 0.3 cm in width 0.1 cm in depth.   Distal tuft ulceration to the left great toe has healed. Complete reepithelialization has occurred.  To the noted ulcerations, there is no eschar there is a minimal amount of slough fibrin and necrotic tissue noted. Granulation tissue wound base is red. There is no exposed bone muscle-tendon ligament or joint. There is a minimal amount of serosanguineous drainage noted. Periwound integrity is intact.  Wound #3 noted to the posterior aspect of the left heel measuring 1.00.60.2cm (LxWxD). There is no eschar. The wound base is 100% fibrotic. There is no granulation tissue noted. There is a minimal amount of serous drainage noted. There is no exposed bone muscle-tendon ligament or joint. Periwound integrity is intact.  Skin is otherwise warm, dry and supple bilateral lower extremities. Negative for open lesions or macerations.  Vascular: Faintly palpable pedal pulses bilaterally. No edema or erythema noted.   Neurological: Epicritic and protective threshold diminished bilaterally.   Musculoskeletal Exam: Palpable mass noted to the posterior aspect of the left leg approximately 4 cm in diameter. Painful on palpation. Range of motion within normal limits to all pedal and ankle joints bilateral. Patient is wheelchair-bound.   Assessment: #1 distal tuft ulcer right great toe secondary to venous insufficiency and  pressure  #2 distal tuft ulcer left great toe secondary to venous insufficiency and pressure - healed #3 ulcer left posterior heel secondary to venous insufficiency and pressure #4 venous insufficiency bilateral lower extremities #5 PVD bilateral lower extremities   Plan of Care:  #1 Patient was evaluated. #2 medically necessary excisional debridement including subcutaneous tissues performed using a tissue nipper and curette. Excisional debridement of all the necrotic nonviable tissue down to healthy bleeding viable tissue was performed with post-debridement measurements same as pre- #3 wounds were cleansed with dry sterile dressing applied. #4 continue wound care at home every other day and physical therapy at home every other day through [facility] #5 patient cannot have an MRI. Ultrasound ordered.  #6 patient is to return to clinic in 3 weeks   Dr. Felecia ShellingBrent M. Evans, DPM [facility]

## 2016-03-11 ENCOUNTER — Ambulatory Visit
Admission: RE | Admit: 2016-03-11 | Discharge: 2016-03-11 | Disposition: A | Discharge status: Home / Self Care | Payer: Medicare Other | Source: Ambulatory Visit | Attending: Internal Medicine | Admitting: Internal Medicine

## 2016-03-11 DIAGNOSIS — R945 Abnormal results of liver function studies: Secondary | ICD-10-CM

## 2016-03-14 ENCOUNTER — Telehealth: Payer: Self-pay | Admitting: *Deleted

## 2016-03-14 NOTE — Telephone Encounter (Signed)
Tyrone, son came into office and stated that patient can't chew anymore and cannot eat because her jaws are locked up. Stated that she can't hardly get any medications down. Going on for 3 days now and can only eat soups, water and juice.  I offered son an appointment and he refused stated that he could not get his mother out of the house. I explained to him that they could call ambulance and bring her non emergency transport to the doctor and he stated that was not an option. I also stated that he could call 911 and have them take her to the Hospital, he refused. He stated that you are his doctor and he wants you to call his phone and give him advise. I stated the process was that I would call him back once she advises to the message and he stated that was not an option. He stated that you were the doctor and he wants to hear from you only and for you to call #[phone]. He stated this is getting serious. Please Advise.

## 2016-03-14 NOTE — Telephone Encounter (Signed)
S/w Tara Hughes by phone. Pt is unable to consume solid food but is able to drink liquids and soups. He would like to start ensure/boost. Encouraged him to do so. He would like homecare aid 8 hrs per day. Recommended he contact [facility] for SW to come out and discuss available options in the area and/or contact [place] to start application for [place]CS. We also discussed her GOC at this time and that the family needs to discuss to continue aggressive therapy vs palliative care. Her next appt is 12/29th in our office and he states he will have her transported by paramedics as he can no longer get her in his car. He will contact office if he has additional questions or concerns.

## 2016-03-19 ENCOUNTER — Other Ambulatory Visit: Payer: Self-pay

## 2016-03-19 MED ORDER — FUROSEMIDE 40 MG PO TABS: 40.0000 mg | ORAL_TABLET | Freq: Every day | ORAL | 5 refills | Status: AC

## 2016-03-19 MED ORDER — POTASSIUM CHLORIDE CRYS ER 10 MEQ PO TBCR: 10.0000 meq | EXTENDED_RELEASE_TABLET | Freq: Every day | ORAL | 5 refills | Status: AC

## 2016-03-21 ENCOUNTER — Telehealth: Payer: Self-pay | Admitting: *Deleted

## 2016-03-21 NOTE — Telephone Encounter (Signed)
A[facility] with [facility] called and stated that the Home Health Nurse placed a referral for Hospice. They want to know if you will be the Attending Physician. Please Advise.

## 2016-03-21 NOTE — Telephone Encounter (Signed)
Yes

## 2016-03-22 ENCOUNTER — Telehealth: Payer: Self-pay | Admitting: *Deleted

## 2016-03-22 NOTE — Telephone Encounter (Signed)
Amber Notified

## 2016-03-22 NOTE — Telephone Encounter (Signed)
Elijah BirkPam Peterson with Hospice called and wanted authorization to sign a DNR for patient. Authorization given.

## 2016-03-27 ENCOUNTER — Telehealth: Payer: Self-pay | Admitting: *Deleted

## 2016-03-27 NOTE — Telephone Encounter (Signed)
Received faxed notification from [facility] that patient passed away on 07/06/15 at 20:40 pm. Will notify MD. POF sent to scheduling to cancel all future appointments.

## 2016-03-29 ENCOUNTER — Ambulatory Visit: Payer: Medicare Other | Admitting: Internal Medicine

## 2016-04-01 DEATH — deceased

## 2016-04-15 ENCOUNTER — Ambulatory Visit: Payer: Medicare Other | Admitting: Podiatry

## 2017-11-02 IMAGING — CT CT HEAD W/O CM
3 of 5 series · 15 of 40 positions shown, 18 images · non-contrast
Comparison: Head CT 06/23/2015

CLINICAL DATA: Weakness and fatigue.

EXAM:
CT HEAD WITHOUT CONTRAST
TECHNIQUE: Contiguous axial images were obtained from the base of the skull
through the vertex without intravenous contrast.

[Series 7: head without cor · coronal · non-contrast · 0.29mm/px · 9 of 66 slices shown, 12 images (1 of 2)]
[im 11/66  brain]
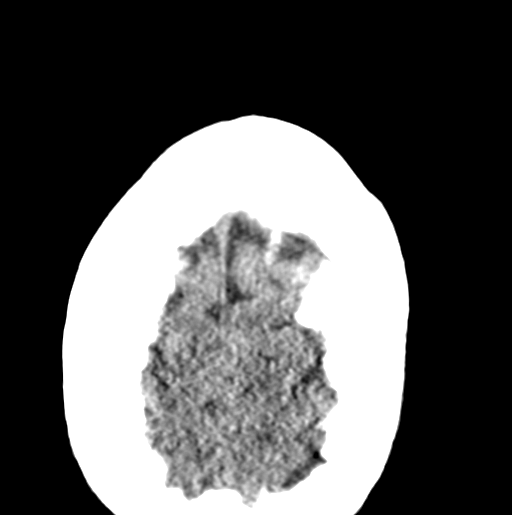
[im 11/66  bone]
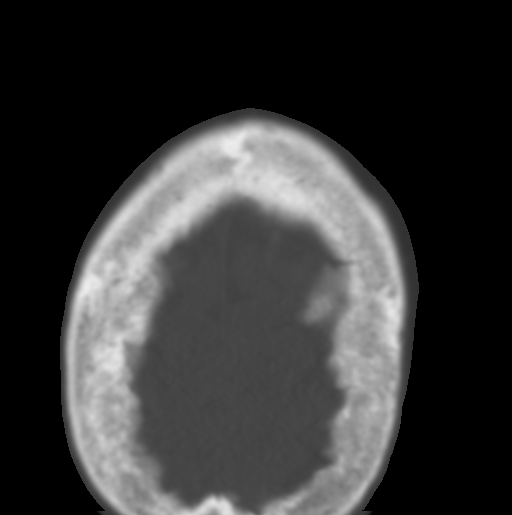
[im 14/66  brain]
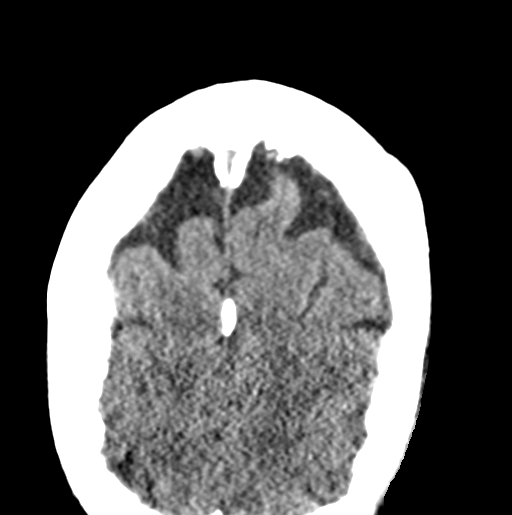
[im 22/66  brain]
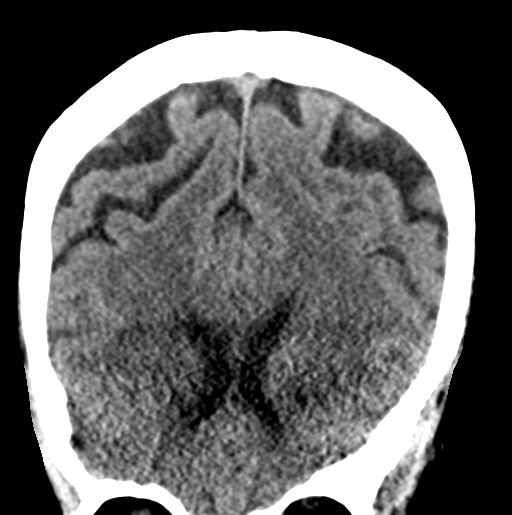
[im 27/66  brain]
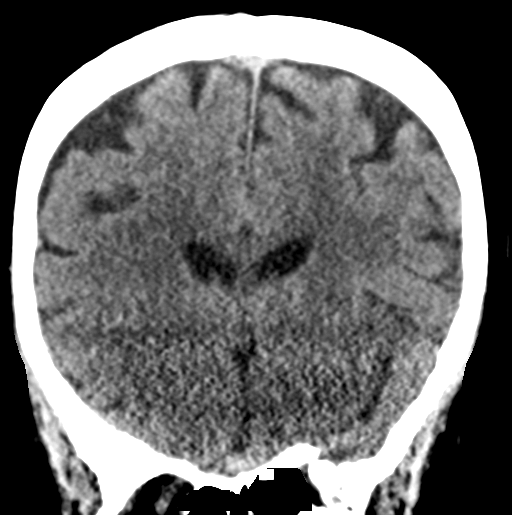
[im 33/66  brain]
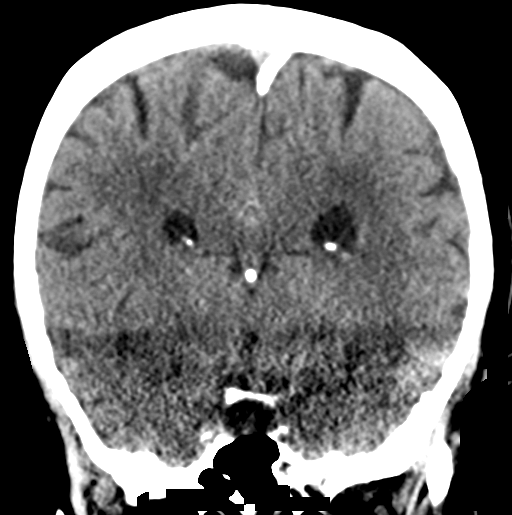
[im 33/66  bone]
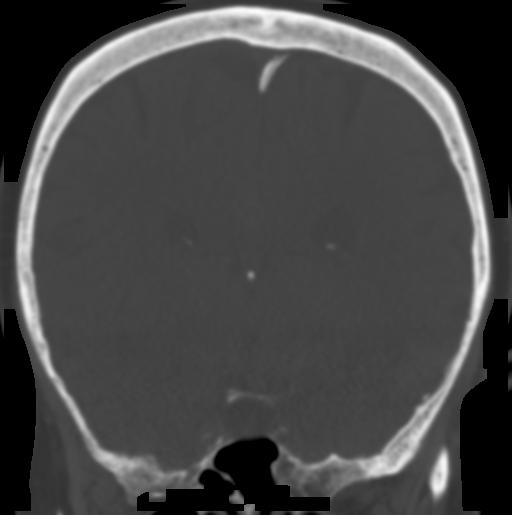
[im 40/66  brain]
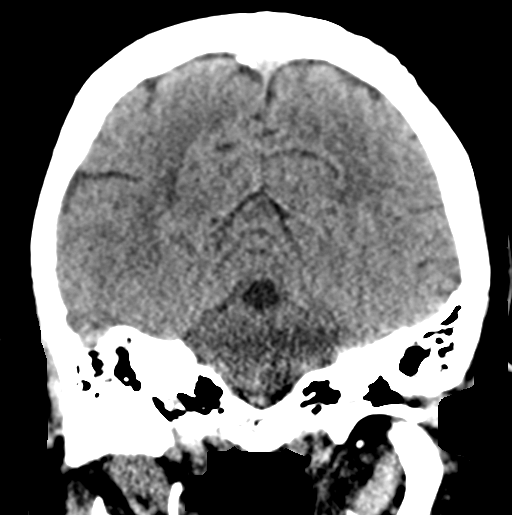
[im 44/66  brain]
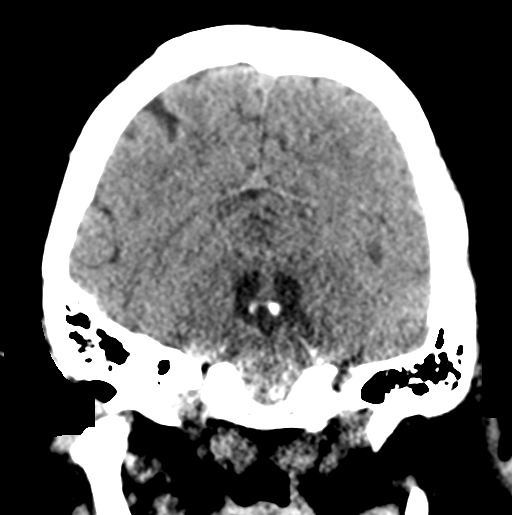
[im 53/66  brain]
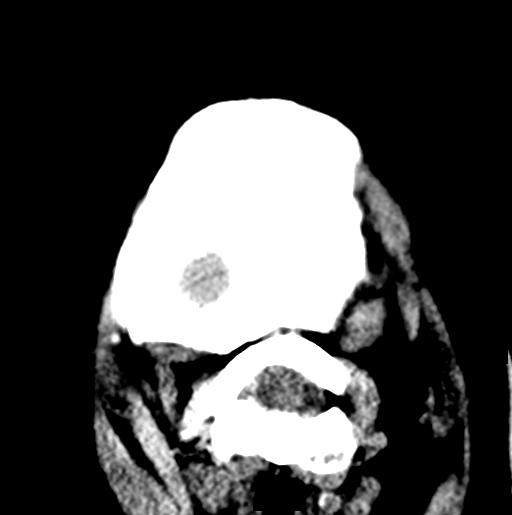
[im 55/66  brain]
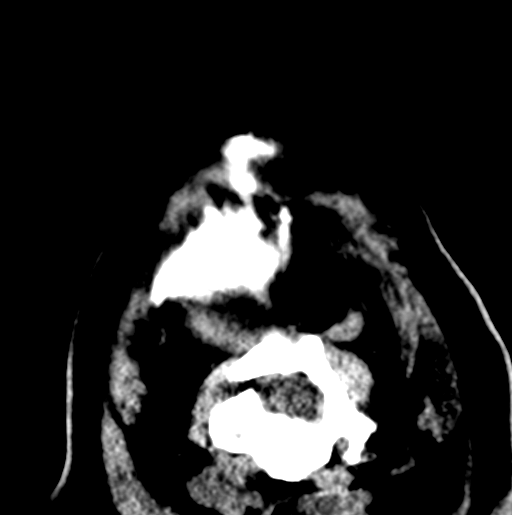
[im 55/66  bone]
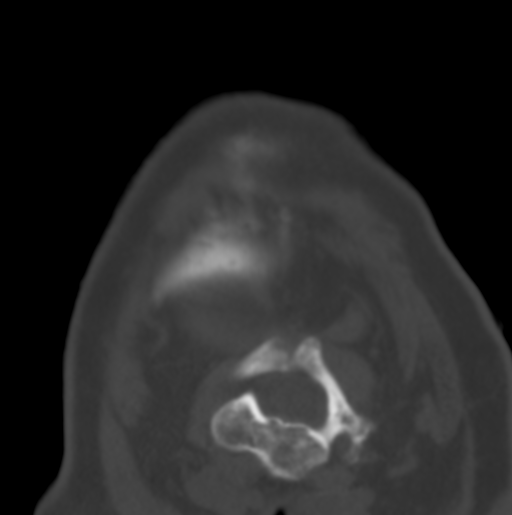

[Series 8: head without sag · sagittal · non-contrast · 0.30mm/px · 3 of 52 slices shown]
[im 18/52  brain]
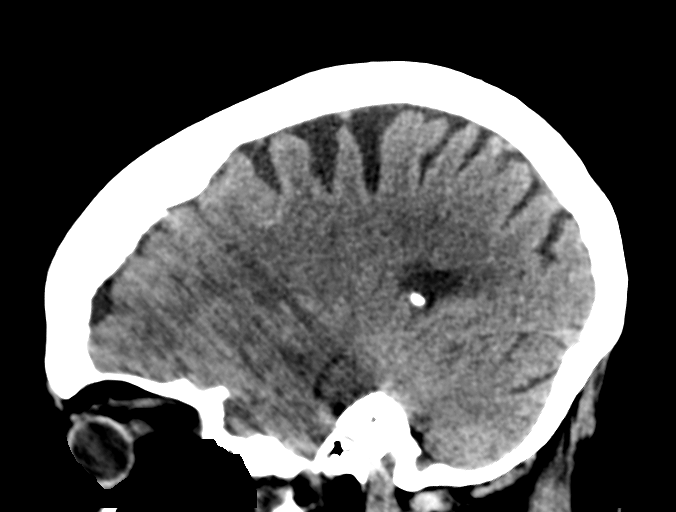
[im 26/52  brain]
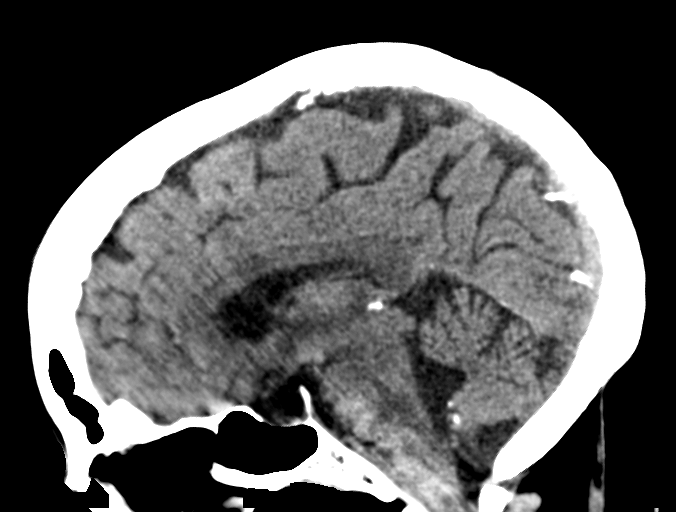
[im 35/52  brain]
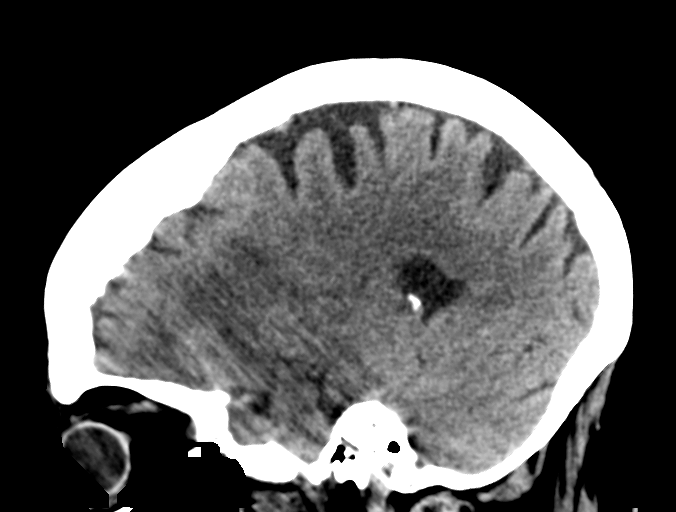

[Series 9: head without cor · coronal · non-contrast · 0.29mm/px · 3 of 54 slices shown (2 of 2)]
[im 18/54  brain]
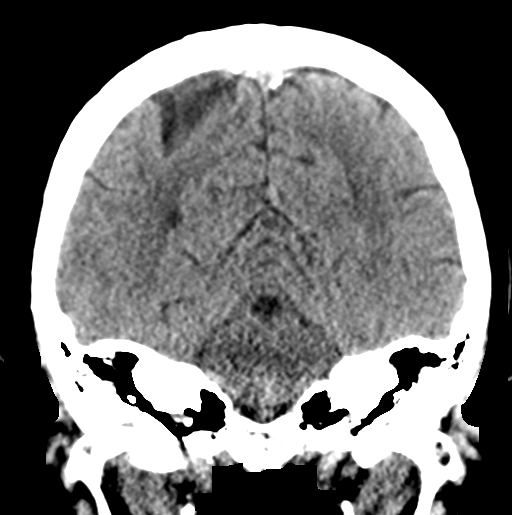
[im 22/54  brain]
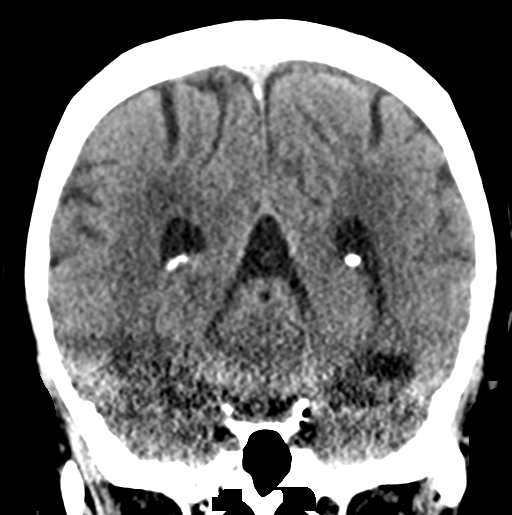
[im 32/54  brain]
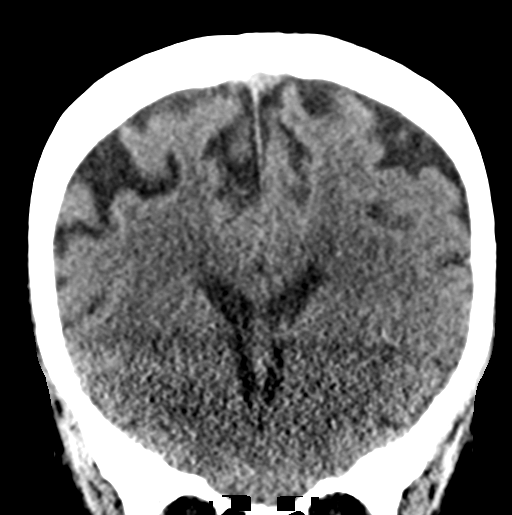

[15 of 40 positions shown; findings below may reference images not displayed]

FINDINGS: Brain: No evidence of acute infarction, hemorrhage, hydrocephalus,
extra-axial collection or mass lesion/mass effect. Small lacunar
infarct in the right basal ganglia, age-indeterminate. Mild normal
for age chronic small vessel ischemia. Under artifact of the frontal
lobes.

Vascular: No hyperdense vessel or unexpected calcification.
Atherosclerosis of skullbase vasculature.

Skull: Normal. Negative for fracture or focal lesion.

Sinuses/Orbits: No acute finding.
IMPRESSION: Age indeterminate lacunar infarct in the right basal ganglia. There
is otherwise no acute intracranial abnormality.
